# Patient Record
Sex: Female | Born: 1975 | Race: Black or African American | Hispanic: No | Marital: Single | Smoking: Never smoker
Health system: Southern US, Community
[De-identification: ages and names within clinical notes are randomized; demographics above are authoritative.]

## PROBLEM LIST (undated history)

## (undated) DIAGNOSIS — D649 Anemia, unspecified: Secondary | ICD-10-CM

## (undated) DIAGNOSIS — R197 Diarrhea, unspecified: Secondary | ICD-10-CM

## (undated) DIAGNOSIS — R1110 Vomiting, unspecified: Secondary | ICD-10-CM

## (undated) DIAGNOSIS — R011 Cardiac murmur, unspecified: Secondary | ICD-10-CM

## (undated) DIAGNOSIS — M722 Plantar fascial fibromatosis: Secondary | ICD-10-CM

## (undated) DIAGNOSIS — I10 Essential (primary) hypertension: Secondary | ICD-10-CM

## (undated) DIAGNOSIS — F32A Depression, unspecified: Secondary | ICD-10-CM

## (undated) DIAGNOSIS — E669 Obesity, unspecified: Secondary | ICD-10-CM

## (undated) DIAGNOSIS — R0602 Shortness of breath: Secondary | ICD-10-CM

## (undated) HISTORY — DX: Plantar fascial fibromatosis: M72.2

## (undated) HISTORY — DX: Anemia, unspecified: D64.9

## (undated) HISTORY — DX: Cardiac murmur, unspecified: R01.1

## (undated) HISTORY — DX: Vomiting, unspecified: R11.10

## (undated) HISTORY — PX: TUBAL LIGATION: SHX77

## (undated) HISTORY — DX: Depression, unspecified: F32.A

## (undated) HISTORY — DX: Diarrhea, unspecified: R19.7

## (undated) HISTORY — DX: Obesity, unspecified: E66.9

---

## 1998-02-19 ENCOUNTER — Inpatient Hospital Stay (HOSPITAL_COMMUNITY): Admission: AD | Admit: 1998-02-19 | Discharge: 1998-02-19 | Payer: Self-pay | Admitting: Obstetrics

## 1998-03-28 ENCOUNTER — Inpatient Hospital Stay (HOSPITAL_COMMUNITY): Admission: AD | Admit: 1998-03-28 | Discharge: 1998-03-30 | Payer: Self-pay | Admitting: Obstetrics & Gynecology

## 2000-04-01 ENCOUNTER — Encounter: Payer: Self-pay | Admitting: Emergency Medicine

## 2000-04-01 ENCOUNTER — Emergency Department (HOSPITAL_COMMUNITY): Admission: EM | Admit: 2000-04-01 | Discharge: 2000-04-01 | Payer: Self-pay | Admitting: Emergency Medicine

## 2000-10-24 ENCOUNTER — Emergency Department (HOSPITAL_COMMUNITY): Admission: EM | Admit: 2000-10-24 | Discharge: 2000-10-24 | Payer: Self-pay | Admitting: Emergency Medicine

## 2000-10-24 ENCOUNTER — Encounter: Payer: Self-pay | Admitting: Emergency Medicine

## 2001-02-04 ENCOUNTER — Emergency Department (HOSPITAL_COMMUNITY): Admission: EM | Admit: 2001-02-04 | Discharge: 2001-02-04 | Payer: Self-pay | Admitting: Emergency Medicine

## 2001-08-21 ENCOUNTER — Emergency Department (HOSPITAL_COMMUNITY): Admission: EM | Admit: 2001-08-21 | Discharge: 2001-08-21 | Payer: Self-pay | Admitting: Emergency Medicine

## 2001-10-23 ENCOUNTER — Inpatient Hospital Stay (HOSPITAL_COMMUNITY): Admission: AD | Admit: 2001-10-23 | Discharge: 2001-10-23 | Payer: Self-pay | Admitting: Obstetrics & Gynecology

## 2002-01-13 ENCOUNTER — Emergency Department (HOSPITAL_COMMUNITY): Admission: EM | Admit: 2002-01-13 | Discharge: 2002-01-13 | Payer: Self-pay | Admitting: Emergency Medicine

## 2002-01-14 ENCOUNTER — Inpatient Hospital Stay (HOSPITAL_COMMUNITY): Admission: AD | Admit: 2002-01-14 | Discharge: 2002-01-14 | Payer: Self-pay | Admitting: Obstetrics

## 2002-01-26 ENCOUNTER — Ambulatory Visit (HOSPITAL_COMMUNITY): Admission: RE | Admit: 2002-01-26 | Discharge: 2002-01-26 | Payer: Self-pay | Admitting: Obstetrics

## 2002-01-26 ENCOUNTER — Encounter: Payer: Self-pay | Admitting: Obstetrics

## 2002-02-26 ENCOUNTER — Inpatient Hospital Stay (HOSPITAL_COMMUNITY): Admission: AD | Admit: 2002-02-26 | Discharge: 2002-02-26 | Payer: Self-pay | Admitting: Obstetrics

## 2002-04-09 ENCOUNTER — Encounter (INDEPENDENT_AMBULATORY_CARE_PROVIDER_SITE_OTHER): Payer: Self-pay | Admitting: *Deleted

## 2002-04-09 ENCOUNTER — Inpatient Hospital Stay (HOSPITAL_COMMUNITY): Admission: AD | Admit: 2002-04-09 | Discharge: 2002-04-14 | Payer: Self-pay | Admitting: Obstetrics

## 2003-01-31 ENCOUNTER — Emergency Department (HOSPITAL_COMMUNITY): Admission: EM | Admit: 2003-01-31 | Discharge: 2003-01-31 | Payer: Self-pay | Admitting: Emergency Medicine

## 2003-07-07 ENCOUNTER — Inpatient Hospital Stay (HOSPITAL_COMMUNITY): Admission: AD | Admit: 2003-07-07 | Discharge: 2003-07-07 | Payer: Self-pay | Admitting: Obstetrics

## 2003-11-18 ENCOUNTER — Inpatient Hospital Stay (HOSPITAL_COMMUNITY): Admission: AD | Admit: 2003-11-18 | Discharge: 2003-11-18 | Payer: Self-pay | Admitting: Obstetrics & Gynecology

## 2004-07-10 ENCOUNTER — Emergency Department (HOSPITAL_COMMUNITY): Admission: EM | Admit: 2004-07-10 | Discharge: 2004-07-11 | Payer: Self-pay | Admitting: Emergency Medicine

## 2004-10-27 ENCOUNTER — Emergency Department (HOSPITAL_COMMUNITY): Admission: EM | Admit: 2004-10-27 | Discharge: 2004-10-27 | Payer: Self-pay | Admitting: Emergency Medicine

## 2005-09-03 ENCOUNTER — Emergency Department (HOSPITAL_COMMUNITY): Admission: EM | Admit: 2005-09-03 | Discharge: 2005-09-03 | Payer: Self-pay | Admitting: *Deleted

## 2005-10-25 ENCOUNTER — Emergency Department (HOSPITAL_COMMUNITY): Admission: EM | Admit: 2005-10-25 | Discharge: 2005-10-25 | Payer: Self-pay | Admitting: Emergency Medicine

## 2007-03-25 ENCOUNTER — Emergency Department (HOSPITAL_COMMUNITY): Admission: EM | Admit: 2007-03-25 | Discharge: 2007-03-26 | Payer: Self-pay | Admitting: Emergency Medicine

## 2007-10-15 ENCOUNTER — Ambulatory Visit: Payer: Self-pay | Admitting: Internal Medicine

## 2007-10-28 ENCOUNTER — Emergency Department (HOSPITAL_COMMUNITY): Admission: EM | Admit: 2007-10-28 | Discharge: 2007-10-28 | Payer: Self-pay | Admitting: Family Medicine

## 2007-10-31 ENCOUNTER — Emergency Department (HOSPITAL_COMMUNITY): Admission: EM | Admit: 2007-10-31 | Discharge: 2007-10-31 | Payer: Self-pay | Admitting: Emergency Medicine

## 2007-11-14 ENCOUNTER — Ambulatory Visit: Payer: Self-pay | Admitting: *Deleted

## 2008-01-23 ENCOUNTER — Encounter (INDEPENDENT_AMBULATORY_CARE_PROVIDER_SITE_OTHER): Payer: Self-pay | Admitting: Internal Medicine

## 2008-01-23 ENCOUNTER — Ambulatory Visit: Payer: Self-pay | Admitting: Family Medicine

## 2008-01-23 ENCOUNTER — Encounter: Payer: Self-pay | Admitting: Family Medicine

## 2008-01-23 LAB — CONVERTED CEMR LAB
Albumin: 4.5 g/dL (ref 3.5–5.2)
Alkaline Phosphatase: 42 units/L (ref 39–117)
Chlamydia, DNA Probe: NEGATIVE
Eosinophils Absolute: 0.1 10*3/uL (ref 0.0–0.7)
GC Probe Amp, Genital: NEGATIVE
Glucose, Bld: 81 mg/dL (ref 70–99)
LDL Cholesterol: 70 mg/dL (ref 0–99)
Lymphocytes Relative: 34 % (ref 12–46)
Lymphs Abs: 2 10*3/uL (ref 0.7–4.0)
MCV: 76.6 fL — ABNORMAL LOW (ref 78.0–100.0)
Neutrophils Relative %: 58 % (ref 43–77)
Platelets: 186 10*3/uL (ref 150–400)
Potassium: 4 meq/L (ref 3.5–5.3)
Sodium: 141 meq/L (ref 135–145)
TSH: 0.706 microintl units/mL (ref 0.350–5.50)
Total Protein: 7.2 g/dL (ref 6.0–8.3)
Triglycerides: 54 mg/dL (ref <150)
WBC: 6 10*3/uL (ref 4.0–10.5)

## 2008-01-29 ENCOUNTER — Encounter: Admission: RE | Admit: 2008-01-29 | Discharge: 2008-01-29 | Payer: Self-pay | Admitting: Family Medicine

## 2008-02-25 ENCOUNTER — Ambulatory Visit: Payer: Self-pay | Admitting: Internal Medicine

## 2008-03-10 ENCOUNTER — Emergency Department (HOSPITAL_COMMUNITY): Admission: EM | Admit: 2008-03-10 | Discharge: 2008-03-10 | Payer: Self-pay | Admitting: Emergency Medicine

## 2008-03-10 ENCOUNTER — Emergency Department (HOSPITAL_COMMUNITY): Admission: EM | Admit: 2008-03-10 | Discharge: 2008-03-10 | Payer: Self-pay | Admitting: Family Medicine

## 2008-04-26 ENCOUNTER — Emergency Department (HOSPITAL_COMMUNITY): Admission: EM | Admit: 2008-04-26 | Discharge: 2008-04-26 | Payer: Self-pay | Admitting: Family Medicine

## 2008-05-25 ENCOUNTER — Emergency Department (HOSPITAL_COMMUNITY): Admission: EM | Admit: 2008-05-25 | Discharge: 2008-05-26 | Payer: Self-pay | Admitting: *Deleted

## 2008-10-19 ENCOUNTER — Ambulatory Visit: Payer: Self-pay | Admitting: Family Medicine

## 2009-01-24 ENCOUNTER — Encounter (INDEPENDENT_AMBULATORY_CARE_PROVIDER_SITE_OTHER): Payer: Self-pay | Admitting: Adult Health

## 2009-01-24 ENCOUNTER — Encounter: Payer: Self-pay | Admitting: Family Medicine

## 2009-01-24 ENCOUNTER — Ambulatory Visit: Payer: Self-pay | Admitting: Family Medicine

## 2009-01-24 LAB — CONVERTED CEMR LAB: Chlamydia, DNA Probe: NEGATIVE

## 2009-02-06 ENCOUNTER — Emergency Department (HOSPITAL_COMMUNITY): Admission: EM | Admit: 2009-02-06 | Discharge: 2009-02-06 | Payer: Self-pay | Admitting: Family Medicine

## 2009-02-07 ENCOUNTER — Ambulatory Visit: Payer: Self-pay | Admitting: Internal Medicine

## 2009-04-10 ENCOUNTER — Emergency Department (HOSPITAL_COMMUNITY): Admission: EM | Admit: 2009-04-10 | Discharge: 2009-04-10 | Payer: Self-pay | Admitting: Family Medicine

## 2009-04-12 ENCOUNTER — Ambulatory Visit: Payer: Self-pay | Admitting: Family Medicine

## 2009-09-12 ENCOUNTER — Telehealth (INDEPENDENT_AMBULATORY_CARE_PROVIDER_SITE_OTHER): Payer: Self-pay | Admitting: *Deleted

## 2009-09-12 ENCOUNTER — Ambulatory Visit: Payer: Self-pay | Admitting: Internal Medicine

## 2009-09-27 ENCOUNTER — Telehealth (INDEPENDENT_AMBULATORY_CARE_PROVIDER_SITE_OTHER): Payer: Self-pay | Admitting: *Deleted

## 2009-09-27 ENCOUNTER — Emergency Department (HOSPITAL_COMMUNITY): Admission: EM | Admit: 2009-09-27 | Discharge: 2009-09-27 | Payer: Self-pay | Admitting: Family Medicine

## 2009-10-04 ENCOUNTER — Ambulatory Visit: Payer: Self-pay | Admitting: Internal Medicine

## 2009-10-04 ENCOUNTER — Telehealth (INDEPENDENT_AMBULATORY_CARE_PROVIDER_SITE_OTHER): Payer: Self-pay | Admitting: *Deleted

## 2009-10-04 ENCOUNTER — Emergency Department (HOSPITAL_COMMUNITY): Admission: EM | Admit: 2009-10-04 | Discharge: 2009-10-04 | Payer: Self-pay | Admitting: Emergency Medicine

## 2009-10-05 ENCOUNTER — Ambulatory Visit (HOSPITAL_COMMUNITY): Admission: RE | Admit: 2009-10-05 | Discharge: 2009-10-05 | Payer: Self-pay | Admitting: Emergency Medicine

## 2009-11-07 ENCOUNTER — Inpatient Hospital Stay (HOSPITAL_COMMUNITY): Admission: AD | Admit: 2009-11-07 | Discharge: 2009-11-07 | Payer: Self-pay | Admitting: Obstetrics & Gynecology

## 2009-11-14 ENCOUNTER — Inpatient Hospital Stay (HOSPITAL_COMMUNITY): Admission: RE | Admit: 2009-11-14 | Discharge: 2009-11-14 | Payer: Self-pay | Admitting: Obstetrics and Gynecology

## 2010-03-23 ENCOUNTER — Emergency Department (HOSPITAL_COMMUNITY): Admission: EM | Admit: 2010-03-23 | Discharge: 2010-03-23 | Payer: Self-pay | Admitting: Emergency Medicine

## 2010-07-16 ENCOUNTER — Inpatient Hospital Stay (HOSPITAL_COMMUNITY): Admission: AD | Admit: 2010-07-16 | Discharge: 2010-07-16 | Payer: Self-pay | Admitting: Obstetrics & Gynecology

## 2010-07-16 ENCOUNTER — Ambulatory Visit: Payer: Self-pay | Admitting: Gynecology

## 2010-08-07 ENCOUNTER — Inpatient Hospital Stay (HOSPITAL_COMMUNITY): Admission: AD | Admit: 2010-08-07 | Discharge: 2010-08-07 | Payer: Self-pay | Admitting: Obstetrics & Gynecology

## 2010-08-07 ENCOUNTER — Ambulatory Visit: Payer: Self-pay | Admitting: Family Medicine

## 2010-08-23 ENCOUNTER — Ambulatory Visit (HOSPITAL_COMMUNITY): Admission: RE | Admit: 2010-08-23 | Discharge: 2010-08-23 | Payer: Self-pay | Admitting: Family Medicine

## 2010-09-13 ENCOUNTER — Ambulatory Visit (HOSPITAL_COMMUNITY)
Admission: RE | Admit: 2010-09-13 | Discharge: 2010-09-13 | Payer: Self-pay | Source: Home / Self Care | Admitting: Family Medicine

## 2010-09-28 ENCOUNTER — Ambulatory Visit (HOSPITAL_COMMUNITY)
Admission: RE | Admit: 2010-09-28 | Discharge: 2010-09-28 | Payer: Self-pay | Source: Home / Self Care | Admitting: Family Medicine

## 2010-09-28 ENCOUNTER — Encounter: Payer: Self-pay | Admitting: Family Medicine

## 2010-11-15 ENCOUNTER — Inpatient Hospital Stay (HOSPITAL_COMMUNITY)
Admission: AD | Admit: 2010-11-15 | Discharge: 2010-11-15 | Payer: Self-pay | Source: Home / Self Care | Attending: Obstetrics & Gynecology | Admitting: Obstetrics & Gynecology

## 2010-11-20 LAB — URINALYSIS, ROUTINE W REFLEX MICROSCOPIC
Protein, ur: NEGATIVE mg/dL
Urobilinogen, UA: 0.2 mg/dL (ref 0.0–1.0)

## 2011-01-10 LAB — CBC
Hemoglobin: 10.1 g/dL — ABNORMAL LOW (ref 12.0–15.0)
Platelets: 144 10*3/uL — ABNORMAL LOW (ref 150–400)
RBC: 3.97 MIL/uL (ref 3.87–5.11)
WBC: 7.5 10*3/uL (ref 4.0–10.5)

## 2011-01-10 LAB — GC/CHLAMYDIA PROBE AMP, GENITAL
Chlamydia, DNA Probe: NEGATIVE
GC Probe Amp, Genital: NEGATIVE

## 2011-01-10 LAB — URINALYSIS, ROUTINE W REFLEX MICROSCOPIC
Bilirubin Urine: NEGATIVE
Hgb urine dipstick: NEGATIVE
Ketones, ur: 15 mg/dL — AB
Protein, ur: NEGATIVE mg/dL
Urobilinogen, UA: 0.2 mg/dL (ref 0.0–1.0)

## 2011-01-11 LAB — URINALYSIS, ROUTINE W REFLEX MICROSCOPIC
Glucose, UA: NEGATIVE mg/dL
Nitrite: NEGATIVE
pH: 6.5 (ref 5.0–8.0)

## 2011-01-11 LAB — CBC
HCT: 36 % (ref 36.0–46.0)
Hemoglobin: 11.6 g/dL — ABNORMAL LOW (ref 12.0–15.0)
RDW: 13.2 % (ref 11.5–15.5)
WBC: 7.3 10*3/uL (ref 4.0–10.5)

## 2011-01-11 LAB — WET PREP, GENITAL
Trich, Wet Prep: NONE SEEN
Yeast Wet Prep HPF POC: NONE SEEN

## 2011-01-14 LAB — GC/CHLAMYDIA PROBE AMP, GENITAL: GC Probe Amp, Genital: NEGATIVE

## 2011-01-14 LAB — URINALYSIS, ROUTINE W REFLEX MICROSCOPIC
Ketones, ur: NEGATIVE mg/dL
Nitrite: NEGATIVE
Protein, ur: NEGATIVE mg/dL

## 2011-01-14 LAB — CBC
Hemoglobin: 10.8 g/dL — ABNORMAL LOW (ref 12.0–15.0)
MCHC: 31.7 g/dL (ref 30.0–36.0)
MCV: 78.3 fL (ref 78.0–100.0)
RDW: 13.2 % (ref 11.5–15.5)

## 2011-01-14 LAB — WET PREP, GENITAL

## 2011-01-14 LAB — ABO/RH: ABO/RH(D): B POS

## 2011-01-14 LAB — HCG, QUANTITATIVE, PREGNANCY: hCG, Beta Chain, Quant, S: 1786 m[IU]/mL — ABNORMAL HIGH (ref <5)

## 2011-02-14 ENCOUNTER — Observation Stay (HOSPITAL_COMMUNITY)
Admission: AD | Admit: 2011-02-14 | Discharge: 2011-02-14 | DRG: 780 | Disposition: A | Discharge status: Home / Self Care | Payer: Medicaid Other | Source: Ambulatory Visit | Attending: Obstetrics & Gynecology | Admitting: Obstetrics & Gynecology

## 2011-02-14 ENCOUNTER — Encounter (HOSPITAL_COMMUNITY): Payer: Self-pay | Admitting: Radiology

## 2011-02-14 DIAGNOSIS — O479 False labor, unspecified: Principal | ICD-10-CM | POA: Diagnosis present

## 2011-02-14 LAB — CBC
MCV: 76.1 fL — ABNORMAL LOW (ref 78.0–100.0)
Platelets: 115 10*3/uL — ABNORMAL LOW (ref 150–400)
RBC: 3.76 MIL/uL — ABNORMAL LOW (ref 3.87–5.11)
WBC: 8 10*3/uL (ref 4.0–10.5)

## 2011-02-15 ENCOUNTER — Inpatient Hospital Stay (HOSPITAL_COMMUNITY)
Admission: AD | Admit: 2011-02-15 | Discharge: 2011-02-15 | Disposition: A | Discharge status: Home / Self Care | Payer: Medicaid Other | Source: Ambulatory Visit | Attending: Obstetrics & Gynecology | Admitting: Obstetrics & Gynecology

## 2011-02-15 DIAGNOSIS — O479 False labor, unspecified: Secondary | ICD-10-CM

## 2011-02-25 ENCOUNTER — Inpatient Hospital Stay (HOSPITAL_COMMUNITY)
Admission: AD | Admit: 2011-02-25 | Discharge: 2011-02-25 | Disposition: A | Discharge status: Home / Self Care | Payer: Medicaid Other | Source: Ambulatory Visit | Attending: Obstetrics and Gynecology | Admitting: Obstetrics and Gynecology

## 2011-02-25 DIAGNOSIS — O479 False labor, unspecified: Secondary | ICD-10-CM | POA: Insufficient documentation

## 2011-02-26 ENCOUNTER — Inpatient Hospital Stay (HOSPITAL_COMMUNITY)
Admission: AD | Admit: 2011-02-26 | Discharge: 2011-02-28 | DRG: 767 | Disposition: A | Discharge status: Home / Self Care | Payer: Medicaid Other | Source: Ambulatory Visit | Attending: Obstetrics and Gynecology | Admitting: Obstetrics and Gynecology

## 2011-02-26 DIAGNOSIS — Z302 Encounter for sterilization: Secondary | ICD-10-CM

## 2011-02-26 LAB — CBC
Hemoglobin: 9.1 g/dL — ABNORMAL LOW (ref 12.0–15.0)
MCH: 23.7 pg — ABNORMAL LOW (ref 26.0–34.0)
RBC: 3.84 MIL/uL — ABNORMAL LOW (ref 3.87–5.11)
WBC: 11.2 10*3/uL — ABNORMAL HIGH (ref 4.0–10.5)

## 2011-02-26 LAB — RPR: RPR Ser Ql: NONREACTIVE

## 2011-02-27 DIAGNOSIS — Z302 Encounter for sterilization: Secondary | ICD-10-CM

## 2011-02-27 LAB — SURGICAL PCR SCREEN
MRSA, PCR: NEGATIVE
Staphylococcus aureus: NEGATIVE

## 2011-02-27 LAB — MRSA PCR SCREENING: MRSA by PCR: NEGATIVE

## 2011-02-28 LAB — CBC
HCT: 24.9 % — ABNORMAL LOW (ref 36.0–46.0)
MCHC: 30.5 g/dL (ref 30.0–36.0)
Platelets: 140 10*3/uL — ABNORMAL LOW (ref 150–400)
RDW: 14 % (ref 11.5–15.5)
WBC: 6.7 10*3/uL (ref 4.0–10.5)

## 2011-02-28 NOTE — Op Note (Addendum)
[name], [name]               ACCOUNT NO.:  [id number]  MEDICAL RECORD NO.:  [id number]           PATIENT TYPE:  I  LOCATION:  9143                          [facility]ACILITY:  [facility]  PHYSICIAN:  Scheryl Darter, MD       DATE OF BIRTH:  12/27/1975  DATE OF PROCEDURE:  02/27/2011 DATE OF DISCHARGE:                              OPERATIVE REPORT   PREOPERATIVE DIAGNOSES: 1. Multiparity. 2. Desires permanent sterilization.  POSTOPERATIVE DIAGNOSES: 1. Multiparity. 2. Desires permanent sterilization.  PROCEDURE:  Postpartum bilateral tubal ligation with Filshie clips.  SURGEON:  Dr. Debroah Loop and Dr. Orvan Falconer.  ANESTHESIA:  Epidural and local with 8 mL of 0.25% Marcaine.  IV FLUIDS:  600.  URINE OUTPUT:  200 mL of clear urine at the end of procedure.  ESTIMATED BLOOD LOSS:  Minimal.  FINDINGS:  Normal uterus and adnexa bilaterally with paratubal cysts present on the right that were small.  Complications were none immediate. INDICATIONS:  This is a 35 year old gravida 5, para 3-1-1-4, status post a vacuum-assisted vaginal delivery on postpartum day #1 who desired a bilateral tubal ligation for permanent sterilization.  Risks, benefits and alternatives were discussed with the patient including but not limited to risk of failure, risk of ectopic, risk of regret being about 0.25% and alternative forms of contraception that are less permanent, the fact that this is permanent and risk of the surgery including bleeding, infection, pain, injury to intraabdominal organs and given all of the aforementioned risks and benefits, the patient consented to bilateral tubal ligation.  PROCEDURE NOTED IN DETAIL:  The patient was taken back to the operative suite where an epidural catheter was rebolused.  After anesthesia was found to be adequate, the patient was prepped and draped in normal sterile fashion and infraumbilical skin incision was made with a scalpel, carried down through to the  underlying fascia.  The fascia was grasped with Kocher clamps and entered with the Mayo scissors.  The fascial incision was then extended manually.  The retractors were then inserted.  The right fallopian tube was identified, grasped with a Babcock clamp, brought to the incision, followed out to the fimbriae and the Filshie clip was then deployed in the isthmic region, that adnexa was then returned to the abdomen in a similar fashion.  The left fallopian tube was then grasped with a Babcock clamp, followed out to the fimbriae and the Filshie clip was then deployed in the isthmic region and that adnexa was then returned to the abdomen.  The fascia was then closed using an 0 Vicryl in a running fashion and the skin was then closed with 4-0 Vicryl in a subcuticular fashion; 8 mL of 0.25% Marcaine was then used for additional local anesthesia.  Lap, needle, instrument and sponge counts were correct x2.  The patient tolerated the procedure well and the patient was taken back to the recovery area in stable condition and there were no immediate complications.    ______________________________ Maryelizabeth Kaufmann, MD   ______________________________ Scheryl Darter, MD    LC/MEDQ  D:  02/27/2011  T:  02/28/2011  Job:  [id number]  Electronically Signed  by Maryelizabeth Kaufmann MD on 03/02/2011 09:12:00 AM Electronically Signed by Scheryl Darter MD on 03/09/2011 10:29:29 AM

## 2011-03-02 NOTE — Discharge Summary (Signed)
NAMEANUPAMA, [name]               ACCOUNT NO.:  [id number]  MEDICAL RECORD NO.:  [id number]           PATIENT TYPE:  I  LOCATION:  9143                          [facility]AC[facility]  [facility]  PHYSICIAN:  Maryelizabeth Kaufmann, MD  DATE OF BIRTH:  Mar 12, 1976  DATE OF ADMISSION:  02/26/2011 DATE OF DISCHARGE:  02/28/2011                              DISCHARGE SUMMARY   ADMISSION DIAGNOSIS:  Intrauterine pregnancy at 39 weeks in labor.  DISCHARGE DIAGNOSES: 1. Status post vacuum-assisted vaginal delivery for non-reassuring     fetal heart tracing. 2. Postpartum bilateral tubal ligation with sterilization.  ATTENDING:  Scheryl Darter, MD  FELLOW:  Maryelizabeth Kaufmann, MD  DISCHARGE MEDICATIONS: 1. Motrin 600 mg 1 tab p.o. q.6 h. as needed. 2. Percocet 5/325, 1 tab p.o. q.4 h. as needed. 3. Colace 100 mg 1 tab p.o. twice a day. 4. Iron 325, 1 tab p.o. twice a day.  DATE OF PROCEDURE:  she had a vacuum-assisted vaginal delivery on February 26, 2011, due to non-reassuring fetal heart tracing and deep variables with pushing with delivery of a viable female infant.  Apgar's of 7 and 9.  Weight was 7 pounds and 0.5 ounces.  Estimated blood loss 400 mL.  No laceration were noted at that time. Subsequent procedure was a bilateral tubal ligation with Filshie clips on Feb 27, 2011.  There were no immediate complications.  There were no pathology.  The patient tolerated the procedure well and her postoperative CBC is pending at the time of this dictation.  HOSPITAL COURSE:  This is a 35 year old G5, P3-1-1-3 who presented on February 26, 2011, with intrauterine pregnancy at 39 weeks and 0 days.  GBS negative in labor.  Her labor continued to progress adequately.  She did need some augmentation of Pitocin due to some protracted active phase. Subsequently during the second stage of delivery, she had deep variables with pushing down into the 60s.  Decision was made at that time to do a vacuum-assisted vaginal delivery.   Infant was delivered otherwise atraumatically.  Apgar's were 7 and 9.  Weight was 7 pounds 5 ounces. There were no lacerations, no complications, and estimated blood loss was 450.  The patient's postpartum course was otherwise benign.  She did have postpartum bilateral tubal ligation on Feb 27, 2011, and her postoperative course from that was otherwise benign and stable.  Her preoperative hemoglobin was 9.8 and her postoperative hemoglobin is pending at the time of dictation.  The patient is otherwise afebrile, hemodynamically stable, doing well, and her incision was clean, dry, and intact on the day of discharge.  She was discharged on postpartum day #2.  DISPOSITION:  Discharged to home.  DISCHARGE CONDITION:  Stable.  FOLLOWUP:  The patient is to follow up in the [facility] or [facility] Clinic in 4-6 weeks for postpartum check.  ER WARNINGS:  The patient to return to the emergency department with any fever, chills, nausea, vomiting, any incisional problems such as redness, swelling, discharge, opening or dehiscence or any other complications.          ______________________________ Maryelizabeth Kaufmann, MD  LC/MEDQ  D:  02/28/2011  T:  02/28/2011  Job:  [id number]  Electronically Signed by Maryelizabeth Kaufmann MD on 03/02/2011 09:13:02 AM

## 2011-03-16 NOTE — Discharge Summary (Signed)
   NAME:  ARANTXA, [name]                           ACCOUNT NO.:   MEDICAL RECORD NO.:  [id number]                    PATIENT TYPE:   LOCATION:                                       FACILITY:   PHYSICIAN:  Kathreen Cosier, M.D.           DATE OF BIRTH:   DATE OF ADMISSION:  04/09/2002  DATE OF DISCHARGE:  04/14/2002                                 DISCHARGE SUMMARY   HISTORY OF PRESENT ILLNESS:  The patient is a 35 year old gravida 3, para 2-  0-0-2, [facility] June 08, 2002.  Had a blood pressure of 160/100 in the office  on June 12, 3+ proteinuria, 3+ edema.  When she was admitted her diastolics  were 105, 117, 143.  Uric acid 6.6.  Cervix 1 cm, 50% with a vertex of -2.  She was started on magnesium sulfate 4 g loading, 2 g/hour.  She received  betamethasone 12.5 IM to be repeated in 12 hours.  Cycotec 20 mcg as well  was inserted.  The patient received Apresoline throughout her stay and she  progressed satisfactorily, had a normal vaginal delivery of a female, Apgars  7 and 7.  Team in attendance.  There was a nuchal cord x1.  Post delivery  she was continued on magnesium sulfate and on postpartum day one received  Apresoline 5 mg IV x1 dose with diastolics of 110.  Postpartum day two she  was started on hydrochlorothiazide 50 mg q.d. and by day three has blood  pressure 154/100 on hydrochlorothiazide.  She was also started on Aldomet  500 mg p.o. q.8h.  By the fourth postpartum day blood pressure was 130/80.  She was discharged home on Aldomet 500 q.8h. and hydrochlorothiazide 50 mg  q.d.  To see me in one week for blood pressure check.   DISCHARGE DIAGNOSES:  1. Status post preeclampsia.  2. Induction of labor.  3. Normal vaginal delivery.                                               Kathreen Cosier, M.D.    BAM/MEDQ  D:  07/23/2002  T:  07/23/2002  Job:  [id number]

## 2011-04-25 ENCOUNTER — Ambulatory Visit (HOSPITAL_COMMUNITY)
Admission: RE | Admit: 2011-04-25 | Discharge: 2011-04-25 | Disposition: A | Discharge status: Home / Self Care | Payer: Medicaid Other | Source: Ambulatory Visit | Attending: Obstetrics and Gynecology | Admitting: Obstetrics and Gynecology

## 2011-04-29 NOTE — Discharge Summary (Signed)
  [name], [name]               ACCOUNT NO.:  [id number]  MEDICAL RECORD NO.:  [id number]  LOCATION:  9166                          [facility]ILITY:  [facility]  PHYSICIAN:  Horton Chin, MD DATE OF BIRTH:  1976-02-04  DATE OF ADMISSION:  02/14/2011 DATE OF DISCHARGE:  02/14/2011                              DISCHARGE SUMMARY   REASON FOR HOSPITALIZATION:  Concern for active labor.  DISCHARGE DIAGNOSIS:  Active labor ruled out.  MEDICATIONS:  Continue home medications: 1. Albuterol inhaler p.r.n. 2. Advair 100/50, 1 puff b.i.d. 3. Iron 325 mg p.o. daily. 4. Prenatal vitamin p.o. daily.  BRIEF HOSPITAL COURSE:  This is a 35 year old G5, P2-1-1-3 at 38.1 weeks presenting with contractions concerning for active labor.  The patient was admitted to the Labor and Delivery for observation.  The patient presented to Labor and Delivery at 13:30.  Her cervical exam showed 3-4 cm dilation, 50% effacement, and -2 station.  However, at 19:30, the patient showed no significant cervical change or increase in contractions over the 6 hour time span despite ambulating and using the ball.  Discussed with the patient and family.  Recommend discharged home since not in active labor.  The patient amendable.  [facility]  Given Ambien as needed for difficulty sleeping secondary to abdominal discomfort. Advised to take Tylenol 650 mg q.6 h. p.r.n. for pain.  Given labor precautions.    ______________________________ Priscella Mann, MD   ______________________________ Horton Chin, MD    AO/MEDQ  D:  04/11/2011  T:  04/12/2011  Job:  [id number]  Electronically Signed by Priscella Mann MD on 04/12/2011 06:05:28 PM Electronically Signed by Jaynie Collins MD on 04/29/2011 09:55:58 AM

## 2011-06-18 ENCOUNTER — Inpatient Hospital Stay (HOSPITAL_COMMUNITY)
Admission: EM | Admit: 2011-06-18 | Discharge: 2011-06-21 | DRG: 417 | Disposition: A | Discharge status: Home / Self Care | Payer: Medicaid Other | Attending: Internal Medicine | Admitting: Internal Medicine

## 2011-06-18 ENCOUNTER — Emergency Department (HOSPITAL_COMMUNITY): Payer: Medicaid Other

## 2011-06-18 DIAGNOSIS — K859 Acute pancreatitis without necrosis or infection, unspecified: Secondary | ICD-10-CM

## 2011-06-18 DIAGNOSIS — E669 Obesity, unspecified: Secondary | ICD-10-CM | POA: Diagnosis present

## 2011-06-18 DIAGNOSIS — K8020 Calculus of gallbladder without cholecystitis without obstruction: Secondary | ICD-10-CM

## 2011-06-18 DIAGNOSIS — J45909 Unspecified asthma, uncomplicated: Secondary | ICD-10-CM | POA: Diagnosis present

## 2011-06-18 DIAGNOSIS — R1011 Right upper quadrant pain: Secondary | ICD-10-CM | POA: Diagnosis present

## 2011-06-18 DIAGNOSIS — K8050 Calculus of bile duct without cholangitis or cholecystitis without obstruction: Principal | ICD-10-CM | POA: Diagnosis present

## 2011-06-18 DIAGNOSIS — D509 Iron deficiency anemia, unspecified: Secondary | ICD-10-CM | POA: Diagnosis present

## 2011-06-18 LAB — CBC
Hemoglobin: 11.8 g/dL — ABNORMAL LOW (ref 12.0–15.0)
Platelets: 178 10*3/uL (ref 150–400)
RBC: 5.03 MIL/uL (ref 3.87–5.11)
WBC: 5.5 10*3/uL (ref 4.0–10.5)

## 2011-06-18 LAB — DIFFERENTIAL
Basophils Absolute: 0 10*3/uL (ref 0.0–0.1)
Basophils Relative: 0 % (ref 0–1)
Eosinophils Absolute: 0.1 10*3/uL (ref 0.0–0.7)
Neutro Abs: 3 10*3/uL (ref 1.7–7.7)
Neutrophils Relative %: 53 % (ref 43–77)

## 2011-06-18 LAB — URINALYSIS, ROUTINE W REFLEX MICROSCOPIC
Bilirubin Urine: NEGATIVE
Glucose, UA: NEGATIVE mg/dL
Nitrite: NEGATIVE
Specific Gravity, Urine: 1.026 (ref 1.005–1.030)
pH: 5.5 (ref 5.0–8.0)

## 2011-06-18 LAB — COMPREHENSIVE METABOLIC PANEL
AST: 19 U/L (ref 0–37)
Albumin: 3.8 g/dL (ref 3.5–5.2)
Alkaline Phosphatase: 73 U/L (ref 39–117)
BUN: 13 mg/dL (ref 6–23)
CO2: 27 mEq/L (ref 19–32)
Chloride: 103 mEq/L (ref 96–112)
Potassium: 3.8 mEq/L (ref 3.5–5.1)
Total Bilirubin: 0.3 mg/dL (ref 0.3–1.2)

## 2011-06-18 LAB — LIPASE, BLOOD: Lipase: 1825 U/L — ABNORMAL HIGH (ref 11–59)

## 2011-06-18 LAB — POCT PREGNANCY, URINE: Preg Test, Ur: NEGATIVE

## 2011-06-18 MED ORDER — IOHEXOL 300 MG/ML  SOLN
100.0000 mL | Freq: Once | INTRAMUSCULAR | Status: AC | PRN
  Administered 2011-06-18: 100 mL via INTRAVENOUS

## 2011-06-19 ENCOUNTER — Inpatient Hospital Stay (HOSPITAL_COMMUNITY): Payer: Medicaid Other

## 2011-06-19 ENCOUNTER — Encounter (HOSPITAL_COMMUNITY): Payer: Self-pay | Admitting: Radiology

## 2011-06-19 DIAGNOSIS — K8010 Calculus of gallbladder with chronic cholecystitis without obstruction: Secondary | ICD-10-CM

## 2011-06-19 HISTORY — PX: CHOLECYSTECTOMY: SHX55

## 2011-06-19 LAB — COMPREHENSIVE METABOLIC PANEL
Albumin: 3.1 g/dL — ABNORMAL LOW (ref 3.5–5.2)
BUN: 12 mg/dL (ref 6–23)
Calcium: 8.6 mg/dL (ref 8.4–10.5)
Creatinine, Ser: 0.74 mg/dL (ref 0.50–1.10)
Total Protein: 6.7 g/dL (ref 6.0–8.3)

## 2011-06-19 LAB — LIPASE, BLOOD: Lipase: 55 U/L (ref 11–59)

## 2011-06-19 LAB — SEDIMENTATION RATE: Sed Rate: 27 mm/hr — ABNORMAL HIGH (ref 0–22)

## 2011-06-19 LAB — MAGNESIUM: Magnesium: 2 mg/dL (ref 1.5–2.5)

## 2011-06-19 LAB — CBC
HCT: 32.9 % — ABNORMAL LOW (ref 36.0–46.0)
MCHC: 31.3 g/dL (ref 30.0–36.0)
MCV: 72.9 fL — ABNORMAL LOW (ref 78.0–100.0)
RDW: 14.1 % (ref 11.5–15.5)

## 2011-06-20 ENCOUNTER — Other Ambulatory Visit (INDEPENDENT_AMBULATORY_CARE_PROVIDER_SITE_OTHER): Payer: Self-pay | Admitting: Surgery

## 2011-06-20 ENCOUNTER — Inpatient Hospital Stay (HOSPITAL_COMMUNITY): Payer: Medicaid Other

## 2011-06-20 LAB — COMPREHENSIVE METABOLIC PANEL
AST: 206 U/L — ABNORMAL HIGH (ref 0–37)
Albumin: 3.5 g/dL (ref 3.5–5.2)
Chloride: 103 mEq/L (ref 96–112)
Creatinine, Ser: 0.59 mg/dL (ref 0.50–1.10)
Potassium: 4.5 mEq/L (ref 3.5–5.1)
Total Bilirubin: 0.4 mg/dL (ref 0.3–1.2)

## 2011-06-20 LAB — CBC
MCV: 72.8 fL — ABNORMAL LOW (ref 78.0–100.0)
Platelets: 150 10*3/uL (ref 150–400)
RDW: 13.9 % (ref 11.5–15.5)
WBC: 8.8 10*3/uL (ref 4.0–10.5)

## 2011-06-20 LAB — TYPE AND SCREEN: ABO/RH(D): B POS

## 2011-06-20 LAB — ABO/RH: ABO/RH(D): B POS

## 2011-06-20 NOTE — Op Note (Signed)
NAMEGARA, KINCADE NO.:  [id number]  MEDICAL RECORD NO.:  [id number]  LOCATION:  5523                         FACILITY:  [facility]  PHYSICIAN:  Wilmon Arms. Corliss Skains, M.D. DATE OF BIRTH:  Jul 15, 1976  DATE OF PROCEDURE:  06/19/2011 DATE OF DISCHARGE:                              OPERATIVE REPORT   PREOPERATIVE DIAGNOSIS:  Gallstone pancreatitis.  POSTOPERATIVE DIAGNOSES:  Gallstone pancreatitis, choledocholithiasis.  PROCEDURE PERFORMED:  Laparoscopic cholecystectomy with intraoperative cholangiogram.  SURGEON:  Wilmon Arms. Corliss Skains, MD  ASSISTANCE:  Brayton El, PA-C  ANESTHESIA:  General endotracheal.  INDICATIONS:  This is a 35 year old female who was admitted on June 18, 2011, with gallstone pancreatitis.  Her pancreatic enzymes normal as quickly and her liver function tests have remained normal.  She presents now for cholecystectomy.  OPERATIVE FINDINGS:  The patient had minimal scarring around her gallbladder.  However, the cholangiogram showed multiple filling defects within the distal common bile duct at the ampulla.  We attempted to use glucagon to open the sphincter body, but this was unsuccessful.  Repeat cholangiogram showed no flow into the duodenum.  DESCRIPTION OF PROCEDURE:  The patient was brought to the operating room and placed in supine position on the operating room table.  After an adequate level of general anesthesia was obtained, the patient's abdomen was prepped with ChloraPrep and draped in sterile fashion.  Time-out was taken to the proper patient, proper procedure.  We infiltrated the area below the umbilicus with 0.25% Marcaine with epinephrine.  We excised her previous infraumbilical transverse cuboid.  Dissection was carried down the fascia.  The fascia was opened vertically.  We entered the peritoneal cavity with bluntly.  A stay sutures of 0 Vicryl was placed around the fascial opening.  The Hasson cannula was inserted and  secured to stay suture.  Pneumoperitoneum was obtained by insufflating CO2 maintaining maximal pressure of 15 mmHg.  The laparoscope was inserted and the patient was positioned to reverse Trendelenburg and tilted to her left.  An 11-mm port was placed in the subxiphoid position.  Two 5 mL ports were placed in the right upper quadrant.  The gallbladder was grasped with a clamp and elevated over the edge of the liver.  There were minimal adhesions to the gallbladder.  We opened the peritoneum around the hilum of the gallbladder and we bluntly dissected around the cystic duct and cystic artery.  The cystic duct was ligated with a clip distally.  A small opening was created on the cystic duct.  A cholangiogram was then obtained showing good flow proximally in the biliary tree.  Contrast flowed distally in the common bile duct, but there were several filling defects at the ampulla and contrast never flowed into the duodenum.  We administered 1 mg of glucagon and waited several minutes.  We attempted another cholangiogram, but there was no contrast flow into the duodenum.  The patient will need an ERCP.  The catheter was removed and a cystic duct was ligated with clips and divided.  Both branches of the cystic artery were ligated with clips and divided.  The gallbladder was then dissected free from the liver.  The gallbladder was placed in  an EndoCatch sac.  We examined the gallbladder fossa thoroughly for hemostasis.  The gallbladder was then removed through the umbilical port site.  The pursestring suture was used to close the umbilical fascia.  We suctioned our irrigation from the right upper quadrant.  The ports were removed as pneumoperitoneum was all released.  A 4-0 Monocryl was used to close the skin incisions.  Steri- Strips were cleaned and dressings were applied.  The patient was then extubated and brought to recovery room in stable condition.  All sponge, instrument and needle  counts were correct.     Wilmon Arms. Corliss Skains, M.D.     MKT/MEDQ  D:  06/19/2011  T:  06/20/2011  Job:  [id number]  Electronically Signed by Manus Rudd M.D. on 06/20/2011 09:47:07 PM

## 2011-06-21 LAB — CBC
HCT: 33.1 % — ABNORMAL LOW (ref 36.0–46.0)
Platelets: 168 10*3/uL (ref 150–400)
RDW: 14.1 % (ref 11.5–15.5)
WBC: 7.3 10*3/uL (ref 4.0–10.5)

## 2011-06-21 LAB — COMPREHENSIVE METABOLIC PANEL
ALT: 127 U/L — ABNORMAL HIGH (ref 0–35)
AST: 55 U/L — ABNORMAL HIGH (ref 0–37)
Albumin: 3.5 g/dL (ref 3.5–5.2)
Alkaline Phosphatase: 99 U/L (ref 39–117)
BUN: 8 mg/dL (ref 6–23)
Chloride: 105 mEq/L (ref 96–112)
Potassium: 4.2 mEq/L (ref 3.5–5.1)
Sodium: 138 mEq/L (ref 135–145)
Total Bilirubin: 0.3 mg/dL (ref 0.3–1.2)

## 2011-06-22 NOTE — Discharge Summary (Signed)
[name], [name] NO.:  [id number]  MEDICAL RECORD NO.:  [id number]  LOCATION:  5523                         FACILITY:  [facility]  PHYSICIAN:  Andreas Blower, MD       DATE OF BIRTH:  June 03, 1976  DATE OF ADMISSION:  06/18/2011 DATE OF DISCHARGE:  06/21/2011                              DISCHARGE SUMMARY   PRIMARY CARE PHYSICIAN:  [facility].  GASTROENTEROLOGIST:  Petra Kuba, MD  SURGEON:  Wilmon Arms. Tsuei, MD  DISCHARGE DIAGNOSES: 1. Gallstone pancreatitis, status post laparoscopic cholecystectomy     and endoscopic retrograde cholangiopancreatography. 2. Asthma. 3. Obesity. 4. Microcytic anemia.  CONSULTANTS: 1. Dr. Vida Rigger, Gastroenterology. 2. Dr. Manus Rudd, [facility].  CONDITION AT THE TIME OF DISCHARGE:  The patient is alert and oriented, requesting discharge to home.  She tells me she is without pain.  She is eating solid food.  She had a bowel movement this morning.  HISTORY AND BRIEF HOSPITAL COURSE:  [name] is a pleasant 35- year-old [place] American female who was status post vaginal delivery in May 2012.  She also has a history of asthma.  She presented to the emergency department with a chief complaint of right upper quadrant abdominal pain radiating to her back that had lasted since she had given birth in May.  She had an ultrasound done on August 20 that showed cholelithiasis with a positive Murphy sign.  She also had a CT abdomen and pelvis on August 20 that showed mild gallbladder distention with no associated inflammatory changes.  Lipase on admission was 1825 and consequently the patient was given the diagnosis of gallstone pancreatitis.  On admission, the patient's LFTs were normal. However, within approximately 36 hours they had risen to an AST of 206, ALT 225.  On August 22, the patient was taken to the OR and underwent laparoscopic cholecystectomy without complications.  This was performed by  Dr. Manus Rudd.  She had an intraoperative cholangiogram that showed contrast filling the biliary tree without evidence of common bile duct stone.  However, contrast did not passed through to the duodenum, so patency could not be confirmed, consequently the patient underwent an ERCP on August 22 with Dr. Vida Rigger.  Dr. Ewing Schlein, swept the common bile duct but did not find any obstructing stones.  Today, on August 23, the patient's LFTs have decreased.  She is reporting that she is pain free. She is able to eat solid foods without difficulty and ready for discharge to home.  PERTINENT LABS THIS HOSPITALIZATION:  Day of discharge CBC, WBC count is 7.3, hemoglobin 10.5, hematocrit 33.1, MCV value 73.4, and platelets 168.  BMET shows a sodium 138, potassium 4.2, chloride 105, bicarb 23, glucose 100, BUN 8, creatinine 0.64, total bilirubin 0.3, alkaline phosphatase 99, AST 55, and ALT 127.  Lipase on August 22 was 148. Pertinent radiological exams were listed in the history and hospital course.  PHYSICAL EXAMINATION:  GENERAL:  Today, the patient is alert and oriented.  She is sitting on the side of the bed.  She has finished eating breakfast and appears well. VITAL SIGNS:  Temperature 98.2, pulse 82, respirations 20, blood pressure  149/67, and O2 sats 98% on room air. HEENT:  Head is atraumatic and normocephalic.  Eyes are anicteric with pupils are equal and round.  Nose shows no nasal discharge or exterior lesions.  Mouth has moist mucous membranes and moderate dentition. NECK:  Supple with midline trachea.  No JVD.  No lymphadenopathy. CHEST:  No accessory muscle use.  No wheezes or crackles to my auscultation. HEART:  Regular rate and rhythm without obvious murmurs, rubs, or gallops. ABDOMEN:  Soft, nondistended.  She has bowel sounds.  She has been multiple small bandages from her laparoscopic surgery, areas around the bandages does not show any erythema, they appear to be  nontender to light palpation. EXTREMITIES:  No clubbing, cyanosis, or edema.  DISCHARGE MEDICATIONS: 1. Hydrocodone/acetaminophen 5/325 1-2 tablets q.6 h. as needed for     pain. 2. Senokot 2 tablets by mouth twice daily to prevent constipation     while she is on her Vicodin. 3. Advair Diskus 250/50 inhaled twice daily. 4. Albuterol inhaler 1 inhalation every 6 hours as needed p.r.n.     shortness of breath.  DISCHARGE INSTRUCTIONS:  The patient will be discharged to home.  She is instructed to increase her activity slowly.  She may shower.  She has no lifting for 3-4 weeks.  Her diet is to be low fat for the next 4-6 weeks and she does not drink alcohol.  FOLLOWUP APPOINTMENTS: 1. She will see Dr. Clelia Croft at [facility] on October 2 at 10:30 a.m. 2. She will present to [facility] at [facility] at     1:30 on September 11 and she is to see Dr. Vida Rigger for     gastroenterology followup as needed. 3. The patient has been instructed to please go to [facility] on     Tuesday, Wednesday and Thursday before     8:00 a.m. for her eligibility appointment.  Suggestions for     outpatient followup, it is noteworthy that the patient's hemoglobin     is 10.5 and her MCV value is 73.4.  Would recommend to our     outpatient physicians that consider an outpatient workup for     microcytic anemia.     Stephani Police, PA   ______________________________ Andreas Blower, MD    MLY/MEDQ  D:  06/21/2011  T:  06/21/2011  Job:  [id number]  cc:   Rosita Fire, M.D. Wilmon Arms. Tsuei, M.D.  Electronically Signed by Algis Downs PA on 06/22/2011 08:51:20 AM Electronically Signed by Wardell Heath Venba Zenner  on 06/22/2011 08:43:26 PM

## 2011-06-29 NOTE — Consult Note (Signed)
NAMEVETRA, SHINALL NO.:  [id number]  MEDICAL RECORD NO.:  [id number]  LOCATION:  5523                         FACILITY:  [facility]  PHYSICIAN:  Wilmon Arms. Corliss Skains, M.D. DATE OF BIRTH:  Jul 03, 1976  DATE OF CONSULTATION:  06/18/2011 DATE OF DISCHARGE:                                CONSULTATION   REQUESTING PHYSICIAN:  Hind Bosie Helper, MD  CONSULTING SURGEON:  Wilmon Arms. Melesio Madara, MD  REASON FOR CONSULTATION:  Abdominal pain with gallstone pancreatitis.  HISTORY OF PRESENT ILLNESS:  Ms. Virgo is a 35 year old black female who is approximately 3 weeks postpartum with a history of asthma who has been having intermittent epigastric abdominal pain since her delivery. The patient awoke this morning around 05:00 a.m. with right upper quadrant abdominal pain.  She denies any epigastric pain or left upper quadrant pain.  She states this radiates around to her back.  She does admit to nausea and vomiting.  Given the severity of the pain, the patient presented to the emergency department.  She had further workup, which included an ultrasound, which revealed cholelithiasis with no ultrasonic findings of cholecystitis.  She was found to have a lipase of 1825.  Her LFTs were unremarkable as well as her BMET and her CBC.  We have been asked by the medicine team to consult on this patient for possible cholecystectomy.  REVIEW OF SYSTEMS:  Please see HPI.  Otherwise, all other systems have been reviewed and are negative.  FAMILY HISTORY:  Noncontributory.  PAST MEDICAL HISTORY: 1. Asthma. 2. Gestational hypertension.  PAST SURGICAL HISTORY:  Laparoscopic tubal ligation.  SOCIAL HISTORY:  The patient lives at home with four children.  She denies any alcohol or illicit drugs.  She does admit to occasional alcohol use, but very minimal.  ALLERGIES:  NKDA.  MEDICATIONS: 1. Advair Diskus 250/50. 2. Albuterol inhaler, which is a rescue inhaler.  PHYSICAL EXAMINATION:   GENERAL:  Ms. Gamel is a 35 year old obese black female who is currently lying in bed in no acute distress. VITAL SIGNS:  Temperature 97.8, pulse 72, blood pressure 121/70, respirations 18. HEENT:  Head is normocephalic, atraumatic.  Sclerae noninjected.  Pupils are equal, round, and reactive to light.  Ears and nose without any obvious masses or lesions.  No rhinorrhea.  Mouth is pink.  Throat shows no exudate. HEART:  Regular rate and rhythm.  Normal S1 and S2.  No murmurs, gallops, rubs are noted.  She does have palpable carotid, radial, and pedal pulses bilaterally. LUNGS:  Clear to auscultation bilaterally with no wheezes, rhonchi, or rales noted.  Respiratory effort is nonlabored. ABDOMEN:  Soft with hypoactive bowel sounds.  She is nondistended.  She is tender in the right upper quadrant with a negative Murphy sign.  She does not have any masses, hernias, or organomegaly noted. MUSCULOSKELETAL:  All four extremities are symmetrical with no cyanosis, clubbing, or edema. SKIN:  Warm and dry with no mass, lesions, or rashes.PSYCH:  The patient is alert and oriented x3 with an appropriate affect.  LABORATORY DATA:  White blood cell count of 5500, hemoglobin 11.8, hematocrit 36.9, platelet count is 178,000.  Sodium, potassium glucose, BUN, and creatinine are  all unremarkable.  AST 19, ALT 15, alkaline phosphatase 73, total bilirubin 0.3, lipase is 1825.  DIAGNOSTICS:  Ultrasound of the abdomen and pelvis revealed gallstones with no evidence of gallbladder wall thickening.  Her common bile duct is 3 mm.  IMPRESSION: 1. Pancreatitis, likely biliary in nature despite completely normal     LFTs. 2. Asthma. 3. History of gestational hypertension.  PLAN:  Given the patient does not drink much alcohol except for on very rare occasion as well as her history since childbirth, it is very likely that the patient's pancreatitis is secondary to gallstones.  Normally, we would expect at  least a small bump in her LFTs, but nonetheless, we will await her lipase to normalize prior to proceeding with a laparoscopic cholecystectomy.  In the meantime, we would recommend that the patient remain n.p.o. except for ice chips to allow her pancreatitis to cool off.  We would recommend aggressive hydration to help with her pancreatitis.  I have explained and discussed all this with the patient as well as her significant other who was in the room.  She understands.     Letha Cape, PA   ______________________________ Wilmon Arms. Corliss Skains, M.D.    KEO/MEDQ  D:  06/18/2011  T:  06/19/2011  Job:  [id number]  Electronically Signed by Barnetta Chapel PA on 06/25/2011 02:37:08 PM Electronically Signed by Manus Rudd M.D. on 06/29/2011 05:02:25 PM

## 2011-07-10 ENCOUNTER — Encounter (INDEPENDENT_AMBULATORY_CARE_PROVIDER_SITE_OTHER): Payer: Self-pay

## 2011-07-17 ENCOUNTER — Encounter (INDEPENDENT_AMBULATORY_CARE_PROVIDER_SITE_OTHER): Payer: Self-pay | Admitting: Surgery

## 2011-07-18 NOTE — Op Note (Signed)
  NAMEKEIRSTIN, MUSIL NO.:  [id number]  MEDICAL RECORD NO.:  [id number]  LOCATION:  5523                         FACILITY:  [facility]  PHYSICIAN:  Petra Kuba, M.D.    DATE OF BIRTH:  1975-11-12  DATE OF PROCEDURE:  06/20/2011 DATE OF DISCHARGE:                              OPERATIVE REPORT   PROCEDURE:  Endoscopic retrograde cholangiopancreatography sphincterotomy and balloon pull-through.  INDICATIONS:  Positive intraoperative cholangiogram worrisome for stones, increased LFTs.  Consent was signed after risks, benefits, methods, options thoroughly discussed multiple times during the hospital stay.  MEDICINES USED:  Fentanyl 125 mcg, Versed 10 mg.  PROCEDURE:  A side-viewing therapeutic video duodenoscope was inserted by indirect vision into the stomach and advanced through a normal antrum and pylorus into the duodenum and a normal-appearing ampulla was brought into view.  Using the triple-lumen sphincterotome loaded with Jag wire, deep selective cannulation was obtained on the first attempt.  Dye was injected, no obvious stones were seen.  We went ahead and proceeded with a medium-sized sphincterotomy until we had adequate biliary drainage and we were able to get the fully bowed sphincterotome in and out of the duct.  No PD injections or wire advancements towards the pancreas were done during the procedure.  We exchanged the sphincterotome for the 12- 15 mm adjustable balloon and proceeded with 3 balloon pull-throughs in the customary fashion without any stone, sludge, or debris being delivered.  There was minimal if no resistance in passing the balloon, which was inflated to 12 mm.  We then proceeded with an occlusion cholangiogram, which was normal.  Once that was pulled through the patent sphincterotomy site, there was slightly sluggish, but adequate biliary drainage.  The wire was removed.  The scope removed.  The patient tolerated the procedure well.   There was no obvious immediate complications.  ENDOSCOPIC DIAGNOSES: 1. Normal ampulla. 2. No PD injections or wire advancements. 3. No obvious stones seen status post medium sphincterotomy and three     12 x 15 adjustable balloon pull-throughs. 4. Negative occlusion cholangiogram with slightly sluggish, but     adequate biliary drainage.  PLAN:  Observe for delayed complications, if none we will slowly advance diet and hopefully home tomorrow.  Happy to see back p.r.n.          ______________________________ Petra Kuba, M.D.     MEM/MEDQ  D:  06/20/2011  T:  06/20/2011  Job:  [id number]  cc:   Wilmon Arms. Tsuei, M.D.  Electronically Signed by Vida Rigger M.D. on 07/18/2011 02:53:05 PM

## 2011-07-18 NOTE — Consult Note (Signed)
  NAMELEANORE, BIGGERS NO.:  [id number]  MEDICAL RECORD NO.:  [id number]  LOCATION:  5523                         FACILITY:  [facility]  PHYSICIAN:  Petra Kuba, M.D.    DATE OF BIRTH:  1976-06-19  DATE OF CONSULTATION:  06/18/2011 DATE OF DISCHARGE:                                CONSULTATION   HISTORY:  The patient with 37-month history of right upper quadrant pain, not every day, but worse with eating came in with increased pain, nausea, and vomiting, although not terribly different than her usual pain, was diagnosed with pancreatitis and gallstones and we were consulted for further workup and plans.  Her liver tests were in fact normal and surgery has been consulted.  She has had no previous GI issues and other than her mother with gallbladder problems, no GI problems run in the family.  PAST MEDICAL HISTORY:  Pertinent for some asthma.  MEDICATIONS AT HOME:  albuterol and Advair and occasional Claritin.  ALLERGIES:  None.  PAST SURGICAL HISTORY:  Bilateral tubal ligation and multiple vaginal deliveries.  SOCIAL HISTORY:  Does not drink or smoke and minimizes drug use.  REVIEW OF SYSTEMS:  Negative except above.  She is passing air from below and is able to keep clear liquids down today.  PHYSICAL EXAMINATION:  GENERAL:  No acute distress. VITAL SIGNS:  Stable, afebrile. HEENT:  Sclerae nonicteric. ABDOMEN:  Soft.  Very minimal right upper quadrant tenderness.  No guarding.  No rebound.  She does even have bowel sounds.  Labs pertinent for lipase of 1825.  Complete metabolic profile is normal including an albumin of 3.8, normal BUN, creatinine, and liver tests. CBC is normal except for hemoglobin of 11.8 with an MCV of 73, normal white count and platelet count.  ASSESSMENT:  Gallstones and mild pancreatitis seemingly.  PLAN:  Since she has no other obvious cause, I would expect gallstones to be the cause, although with her every other day and  frequent pain, we may have missed the liver test bump and possibly her lipase maybe even have been higher in the future.  However, if she resolves quickly, I think a lap chole with intraoperative cholangiogram is the way to proceed.  If there is a question, could consider MRCP and an MRI of the pancreas and possibly even an EUS, but we will follow with you and hopefully she will be better and able to proceed with surgery soon.          ______________________________ Petra Kuba, M.D.     MEM/MEDQ  D:  06/18/2011  T:  06/19/2011  Job:  [id number]  Electronically Signed by Vida Rigger M.D. on 07/18/2011 02:53:02 PM

## 2011-07-31 ENCOUNTER — Encounter (INDEPENDENT_AMBULATORY_CARE_PROVIDER_SITE_OTHER): Payer: Self-pay | Admitting: Surgery

## 2011-08-02 ENCOUNTER — Encounter (INDEPENDENT_AMBULATORY_CARE_PROVIDER_SITE_OTHER): Payer: Self-pay | Admitting: Surgery

## 2011-08-02 ENCOUNTER — Ambulatory Visit (INDEPENDENT_AMBULATORY_CARE_PROVIDER_SITE_OTHER): Payer: Self-pay | Admitting: Surgery

## 2011-08-02 VITALS — BP 120/78 | HR 60 | Temp 96.6°F | Resp 24 | Ht 68.0 in | Wt 236.5 lb

## 2011-08-02 DIAGNOSIS — K859 Acute pancreatitis without necrosis or infection, unspecified: Secondary | ICD-10-CM

## 2011-08-02 DIAGNOSIS — K851 Biliary acute pancreatitis without necrosis or infection: Secondary | ICD-10-CM

## 2011-08-02 NOTE — Patient Instructions (Signed)
Take a fiber supplement daily to help with the bowel movements.  Go to the lab today and have your blood work drawn.  We will call you if there are any significant abnormalities on your blood work.  Follow-up in 2-3 weeks.

## 2011-08-02 NOTE — Progress Notes (Signed)
This patient is status post laparoscopic cholecystectomy on August 21 for gallstone pancreatitis. Her intraoperative cholangiogram showed no flow into the duodenum. She underwent ERCP which did not show any stones in the common duct. Her liver function tests improved and she was discharged home on 8/23. Her main complaint is occasional pain at a specific point in her right upper quadrant which occurs at random times. This is not related to eating. She does have frequent soft bowel movements when eating. She has been trying to avoid any greasy foods. Otherwise she is doing well.  On examination her incisions are all well healed. The area where she is experiencing pain seems to be right at her cholangiogram insertion site.This is right on the edge of the right costal margin. No other abdominal tenderness. Active bowel sounds.  Impression: Status post laparoscopic cholecystectomy and ERCP for gallstone pancreatitis/choledocholithiasis. Now complaining of some intermittent right upper quadrant pain and diarrhea. This pain is likely due to the cholangiogram catheter at the edge of her right costal margin. However, given her history of pancreatitis, I think we should check liver function tests, amylase, and lipase to make sure that these are normal. I also encouraged her to add a fiber supplement to help her with her bowel movements. Recheck in 2 weeks.

## 2011-08-13 NOTE — H&P (Signed)
[name], [name]               ACCOUNT NO.:  [id number]  MEDICAL RECORD NO.:  [id number]  LOCATION:  [facility]                         FACILITY:  [facility]  PHYSICIAN:  Deunta Beneke I Khushboo Chuck, MD      DATE OF BIRTH:  August 14, 1976  DATE OF ADMISSION:  06/18/2011 DATE OF DISCHARGE:                             HISTORY & PHYSICAL   PRIMARY CARE PHYSICIAN:  [facility].  CHIEF COMPLAINT:  Right upper quadrant abdominal pain, nausea, and vomiting for the last 3 months.  HISTORY OF PRESENT ILLNESS:  This is a 35 year old African American female who is status post vacuum-assisted vaginal delivery and status post bilateral tubal ligation with sterilization in May 2012, asthma. The patient admitted she presented to the ED hospital with chief complaint of right upper quadrant abdominal pain radiating to her back and to her right shoulder plate for the last 3 months.  Pain is on and off, but this time she had the worst pain and pain lasting for more hours.  Her abdominal pain started gradually and colicky in nature associated with nausea and bilious vomitus.  Also had a low-grade fever at home.  The patient admitted as this is the worse pain she had experienced within the last 3 months and usually the pain has been every 2-3 weeks.  The patient denies any change in her bowel habits.  Denies any diarrhea.  Denies any chills.  Denies any yellowish discoloration of her eyes.  In the emergency room, the patient was found to have lipase of 1825 and ultrasound suggest cholelithiasis, but no evidence of cholecystitis and hospitalist service asked to admit the patient and further management.  PAST MEDICAL HISTORY:  History of asthma.  MEDICATIONS:  Albuterol and Advair.  ALLERGIES:  No known drug allergies.  PAST SURGICAL HISTORY:  None.  The patient is status post recent vacuum- assisted vaginal delivery and bilateral tubal ligation.  SOCIAL HISTORY:  The patient is not working, has 4 children, lives  with her husband.  Denies any smoking, alcohol abuse, or illicit drug abuse.  FAMILY HISTORY:  Noncontributory.  REVIEW OF SYSTEMS:  The patient denies any headache.  Denies any numbness or weakness of her extremities.  Denies any blurring of vision. Denies any seizure.  RESPIRATORY:  Denies any coughing.  No shortness of breath.  Denies any chest pain.  Denies any orthopnea or paroxysmal nocturnal dyspnea.  Denies any lower extremity swelling.  ABDOMEN: Complaint of right lower quadrant abdominal pain radiating to the back associated with nausea and vomiting.  No change in bowel habit.  Denies any burning with micturition.  Denies any hematuria.  Denies any back pain.  PHYSICAL EXAMINATION:  VITAL SIGNS:  Temperature 97.8, blood pressure 112/71, respiratory rate 16, pulse rate 86, saturation 100% on room air. HEENT:  Normocephalic, atraumatic.  Pupils equal, reactive to light and accommodation. NECK:  Supple.  No lymphadenopathy. HEART:  S1 and S2.  No added sound.  Breast engorged and nipples erected and some milk coming. ABDOMEN:  Soft, not distended, no masses, but there is tenderness at the right hypochondrium.  Murphy sign negative. EXTREMITIES:  Without lower leg lymphedema.  Peripheral pulses intact. CNS:  The patient  is awake, alert, oriented x3 with no focal neuro deficit.  LABORATORY DATA:  Blood workup; hemoglobin 11.8, hematocrit 36.9, MCV 73.4, MCH 23.5.  Urinalysis negative.  Sodium 138, potassium 3.8, chloride 103, CO2 of 27, glucose 104, BUN 13, creatinine 0.61.  LFTs within normal.  Lipase 1825.  Ultrasound shows cholelithiasis, possible sonographic Murphy sign without other ultrasound suggesting cholecystitis.  ASSESSMENT AND PLAN: 1. This is a 35 year old female recently having vacuum-assisted     vaginal delivery with bilateral tubal ligation presented with right     hypochondrium pain and positive lipase suggesting acute     pancreatitis, which is likely  related to gallstone pancreatitis but     with her recent history, I doubt any pelvic inflammatory disease,     no vaginal discharge.  I will proceed with getting CT of abdomen     and pelvis with IV contrast.  We will keep the patient on clear     liquid diet.  Most likely diagnosis is gallstone pancreatitis with     normal LFTs.  We will ask General Surgery and GI to evaluate.  We     will control pain with Dilaudid and will continue IV fluids. 2. Asthma, seems stable. 3. She had recent delivery.  The patient admitted she is not breast-     feeding on a regular basis and would like to wean from breast-     feeding, so we will continue with the current medication and     pharmacy could dose Zosyn.  No plan to consult pharmacy for     checking medication for safety during lactation as the patient     admitted she will not continue breast-feeding her child.  DVT and     GI prophylaxis.     Cordaryl Decelles Bosie Helper, MD     HIE/MEDQ  D:  06/18/2011  T:  06/18/2011  Job:  [id number]  Electronically Signed by Ebony Cargo MD on 08/13/2011 04:30:22 PM

## 2011-08-14 ENCOUNTER — Encounter (INDEPENDENT_AMBULATORY_CARE_PROVIDER_SITE_OTHER): Payer: Self-pay | Admitting: Surgery

## 2011-08-16 ENCOUNTER — Encounter (INDEPENDENT_AMBULATORY_CARE_PROVIDER_SITE_OTHER): Payer: Self-pay | Admitting: Surgery

## 2012-01-01 ENCOUNTER — Emergency Department (HOSPITAL_COMMUNITY): Payer: Self-pay

## 2012-01-01 ENCOUNTER — Emergency Department (HOSPITAL_COMMUNITY)
Admission: EM | Admit: 2012-01-01 | Discharge: 2012-01-02 | Disposition: A | Discharge status: Home / Self Care | Payer: Self-pay | Attending: Internal Medicine | Admitting: Internal Medicine

## 2012-01-01 ENCOUNTER — Encounter (HOSPITAL_COMMUNITY): Payer: Self-pay | Admitting: *Deleted

## 2012-01-01 DIAGNOSIS — Z79899 Other long term (current) drug therapy: Secondary | ICD-10-CM | POA: Insufficient documentation

## 2012-01-01 DIAGNOSIS — D649 Anemia, unspecified: Secondary | ICD-10-CM | POA: Diagnosis present

## 2012-01-01 DIAGNOSIS — E876 Hypokalemia: Secondary | ICD-10-CM | POA: Diagnosis present

## 2012-01-01 DIAGNOSIS — R0682 Tachypnea, not elsewhere classified: Secondary | ICD-10-CM | POA: Insufficient documentation

## 2012-01-01 DIAGNOSIS — J4541 Moderate persistent asthma with (acute) exacerbation: Secondary | ICD-10-CM | POA: Diagnosis present

## 2012-01-01 DIAGNOSIS — J45901 Unspecified asthma with (acute) exacerbation: Secondary | ICD-10-CM | POA: Insufficient documentation

## 2012-01-01 DIAGNOSIS — R079 Chest pain, unspecified: Secondary | ICD-10-CM | POA: Insufficient documentation

## 2012-01-01 DIAGNOSIS — R000 Tachycardia, unspecified: Secondary | ICD-10-CM | POA: Insufficient documentation

## 2012-01-01 HISTORY — DX: Hypokalemia: E87.6

## 2012-01-01 LAB — BLOOD GAS, ARTERIAL
Drawn by: 313061
FIO2: 0.21 %
pCO2 arterial: 33.5 mmHg — ABNORMAL LOW (ref 35.0–45.0)
pO2, Arterial: 54.5 mmHg — ABNORMAL LOW (ref 80.0–100.0)

## 2012-01-01 LAB — BASIC METABOLIC PANEL
Calcium: 8.9 mg/dL (ref 8.4–10.5)
Creatinine, Ser: 0.64 mg/dL (ref 0.50–1.10)
GFR calc Af Amer: >90 mL/min (ref >90)
GFR calc non Af Amer: >90 mL/min (ref >90)

## 2012-01-01 LAB — CBC
HCT: 33.3 % — ABNORMAL LOW (ref 36.0–46.0)
MCHC: 31.2 g/dL (ref 30.0–36.0)
MCV: 74.3 fL — ABNORMAL LOW (ref 78.0–100.0)
RDW: 13 % (ref 11.5–15.5)

## 2012-01-01 LAB — DIFFERENTIAL
Basophils Absolute: 0 10*3/uL (ref 0.0–0.1)
Basophils Relative: 0 % (ref 0–1)
Eosinophils Absolute: 0.1 10*3/uL (ref 0.0–0.7)
Eosinophils Relative: 1 % (ref 0–5)
Monocytes Absolute: 0.2 10*3/uL (ref 0.1–1.0)

## 2012-01-01 LAB — MAGNESIUM: Magnesium: 1.8 mg/dL (ref 1.5–2.5)

## 2012-01-01 MED ORDER — ONDANSETRON HCL 4 MG PO TABS: 4.0000 mg | ORAL_TABLET | Freq: Four times a day (QID) | ORAL | Status: DC | PRN

## 2012-01-01 MED ORDER — SODIUM CHLORIDE 0.9 % IJ SOLN: 3.0000 mL | Freq: Two times a day (BID) | INTRAMUSCULAR | Status: DC

## 2012-01-01 MED ORDER — ALBUTEROL SULFATE (5 MG/ML) 0.5% IN NEBU
2.5000 mg | INHALATION_SOLUTION | Freq: Four times a day (QID) | RESPIRATORY_TRACT | Status: DC
  Administered 2012-01-01 – 2012-01-02 (×3): 2.5 mg via RESPIRATORY_TRACT
  Filled 2012-01-01 (×3): qty 0.5

## 2012-01-01 MED ORDER — PREDNISONE 20 MG PO TABS
60.0000 mg | ORAL_TABLET | Freq: Once | ORAL | Status: AC
  Administered 2012-01-01: 60 mg via ORAL
  Filled 2012-01-01: qty 3

## 2012-01-01 MED ORDER — HYDROCODONE-ACETAMINOPHEN 5-325 MG PO TABS
1.0000 | ORAL_TABLET | ORAL | Status: DC | PRN
  Administered 2012-01-01 – 2012-01-02 (×2): 2 via ORAL
  Filled 2012-01-01 (×2): qty 2

## 2012-01-01 MED ORDER — ALBUTEROL (5 MG/ML) CONTINUOUS INHALATION SOLN
10.0000 mg/h | INHALATION_SOLUTION | RESPIRATORY_TRACT | Status: DC
  Administered 2012-01-01: 10 mg/h via RESPIRATORY_TRACT

## 2012-01-01 MED ORDER — GUAIFENESIN 100 MG/5ML PO SOLN
5.0000 mL | ORAL | Status: DC | PRN
  Administered 2012-01-01: 100 mg via ORAL
  Filled 2012-01-01: qty 5

## 2012-01-01 MED ORDER — IPRATROPIUM BROMIDE 0.02 % IN SOLN
0.5000 mg | Freq: Once | RESPIRATORY_TRACT | Status: AC
  Administered 2012-01-01: 0.5 mg via RESPIRATORY_TRACT
  Filled 2012-01-01: qty 2.5

## 2012-01-01 MED ORDER — PANTOPRAZOLE SODIUM 40 MG PO TBEC
40.0000 mg | DELAYED_RELEASE_TABLET | Freq: Every day | ORAL | Status: DC
  Administered 2012-01-01: 40 mg via ORAL
  Filled 2012-01-01: qty 1

## 2012-01-01 MED ORDER — MORPHINE SULFATE 2 MG/ML IJ SOLN: 1.0000 mg | INTRAMUSCULAR | Status: DC | PRN

## 2012-01-01 MED ORDER — ONDANSETRON HCL 4 MG/2ML IJ SOLN: 4.0000 mg | Freq: Four times a day (QID) | INTRAMUSCULAR | Status: DC | PRN

## 2012-01-01 MED ORDER — ALBUTEROL SULFATE (5 MG/ML) 0.5% IN NEBU
2.5000 mg | INHALATION_SOLUTION | Freq: Once | RESPIRATORY_TRACT | Status: AC
  Administered 2012-01-01: 2.5 mg via RESPIRATORY_TRACT
  Filled 2012-01-01: qty 0.5

## 2012-01-01 MED ORDER — SODIUM CHLORIDE 0.9 % IV SOLN: 250.0000 mL | INTRAVENOUS | Status: DC | PRN

## 2012-01-01 MED ORDER — FERROUS SULFATE 325 (65 FE) MG PO TABS
325.0000 mg | ORAL_TABLET | Freq: Two times a day (BID) | ORAL | Status: DC
  Filled 2012-01-01 (×2): qty 1

## 2012-01-01 MED ORDER — POTASSIUM CHLORIDE CRYS ER 20 MEQ PO TBCR
40.0000 meq | EXTENDED_RELEASE_TABLET | ORAL | Status: AC
  Administered 2012-01-01 – 2012-01-02 (×3): 40 meq via ORAL
  Filled 2012-01-01: qty 2
  Filled 2012-01-01: qty 1
  Filled 2012-01-01: qty 2
  Filled 2012-01-01: qty 1

## 2012-01-01 MED ORDER — FLUTICASONE-SALMETEROL 250-50 MCG/DOSE IN AEPB
1.0000 | INHALATION_SPRAY | Freq: Two times a day (BID) | RESPIRATORY_TRACT | Status: DC
  Administered 2012-01-01 – 2012-01-02 (×2): 1 via RESPIRATORY_TRACT
  Filled 2012-01-01: qty 14

## 2012-01-01 MED ORDER — ACETAMINOPHEN 650 MG RE SUPP: 650.0000 mg | Freq: Four times a day (QID) | RECTAL | Status: DC | PRN

## 2012-01-01 MED ORDER — ALBUTEROL SULFATE (5 MG/ML) 0.5% IN NEBU: 2.5000 mg | INHALATION_SOLUTION | RESPIRATORY_TRACT | Status: DC | PRN

## 2012-01-01 MED ORDER — METHYLPREDNISOLONE SODIUM SUCC 125 MG IJ SOLR
80.0000 mg | Freq: Three times a day (TID) | INTRAMUSCULAR | Status: DC
  Administered 2012-01-01 – 2012-01-02 (×2): 80 mg via INTRAVENOUS
  Filled 2012-01-01 (×2): qty 2

## 2012-01-01 MED ORDER — SODIUM CHLORIDE 0.9 % IJ SOLN: 3.0000 mL | INTRAMUSCULAR | Status: DC | PRN

## 2012-01-01 MED ORDER — SODIUM CHLORIDE 0.9 % IV SOLN
INTRAVENOUS | Status: AC
  Administered 2012-01-01: 400 mL via INTRAVENOUS
  Administered 2012-01-02: 1000 mL via INTRAVENOUS

## 2012-01-01 MED ORDER — IPRATROPIUM BROMIDE 0.02 % IN SOLN
0.5000 mg | Freq: Four times a day (QID) | RESPIRATORY_TRACT | Status: DC
  Administered 2012-01-01 – 2012-01-02 (×3): 0.5 mg via RESPIRATORY_TRACT
  Filled 2012-01-01 (×3): qty 2.5

## 2012-01-01 MED ORDER — ZOLPIDEM TARTRATE 5 MG PO TABS: 5.0000 mg | ORAL_TABLET | Freq: Every evening | ORAL | Status: DC | PRN

## 2012-01-01 MED ORDER — ACETAMINOPHEN 325 MG PO TABS: 650.0000 mg | ORAL_TABLET | Freq: Four times a day (QID) | ORAL | Status: DC | PRN

## 2012-01-01 MED ORDER — MAGNESIUM SULFATE 40 MG/ML IJ SOLN
2.0000 g | Freq: Once | INTRAMUSCULAR | Status: AC
  Administered 2012-01-01: 2 g via INTRAVENOUS
  Filled 2012-01-01: qty 50

## 2012-01-01 NOTE — ED Notes (Signed)
Report received from [place], [place]

## 2012-01-01 NOTE — ED Notes (Signed)
Tammy RT at bedside to start pt's breathing tx

## 2012-01-01 NOTE — H&P (Signed)
PCP:   No primary provider on file.   Chief Complaint:  Shortness of breath, cough increased wheezing  HPI: 36 year old female with a past medical history significant for asthma, anemia (baseline hemoglobin 10.0) and obesity; came to the ED complaining of gradually worsening wheezing, cough, shortness of breath and chest discomfort 2/2 cough. Patient reports that her symptoms has been present and worsening over the last 5-7 days. She denies having any fever, chills, nausea/vomiting, abdominal pain, dysuria or any other acute complaints. 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 Patient has never been intubated/hospitalize for asthma in the past. During this occasion despite using her albuterol/atrovent every 3-4 hours and her continue use of Advair she did no have any improvement and decided to come to the ED for further evaluation and treatment.  Allergies:  No Known Allergies    Past Medical History  Diagnosis Date  . Asthma   . Anemia   . Obesity   . Abdominal pain   . Vomiting   . Diarrhea     Past Surgical History  Procedure Date  . Cholecystectomy 06/19/11    Prior to Admission medications   Medication Sig Start Date End Date Taking? Authorizing Provider  acetaminophen (TYLENOL) 500 MG tablet Take 500 mg by mouth every 6 (six) hours as needed. pain   Yes Historical Provider, MD  albuterol (PROVENTIL HFA;VENTOLIN HFA) 108 (90 BASE) MCG/ACT inhaler Inhale 2 puffs into the lungs every 6 (six) hours as needed. wheezing   Yes Historical Provider, MD  albuterol (PROVENTIL) (2.5 MG/3ML) 0.083% nebulizer solution Take 2.5 mg by nebulization every 6 (six) hours as needed. wheezing   Yes Historical Provider, MD  Fluticasone-Salmeterol (ADVAIR) 250-50 MCG/DOSE AEPB Inhale 1 puff into the lungs every 12 (twelve) hours.     Yes Historical Provider, MD    Social History:  reports that she has never smoked. She has never used  smokeless tobacco. She reports that she does not drink alcohol or use illicit drugs.  Family History  Problem Relation Age of Onset  . Diabetes Mother 53  . Cancer Father 62    lung cancer    Review of Systems:  Negative except as mentioned on history of present illness.   Physical Exam: Blood pressure 142/78, pulse 96, temperature 98.6 F (37 C), temperature source Oral, resp. rate 28, SpO2 99.00%. Constitutional: She is oriented to person, place, and time. She appears well-developed and well-nourished. Mild distress and with difficulty speaking in full sentences.  Head: Normocephalic and atraumatic.  Eyes: PERRLA, extra ocular muscles intact, no icterus.  Neck: Normal range of motion. No thyromegaly. Cardiovascular: Regular rhythm; S1 and S2 appreciated; mild tachycardia after nebulizer treatments. No murmurs or rubs.  Pulmonary/Chest: Fair air movement, tachypneic, patient is speaking in broken sentences due to mild respiratory distress; diffuse expiratory wheezing and no crackles.   Extremities: No edema, no cyanosis or clubbing. There is no tenderness over cough  Neurological: She is alert and oriented to person, place, and time. Cranial nerve 2-12 grossly intact, no focal neurologic deficit appreciated. Skin: Skin is warm and dry. No rash noted.    Labs on Admission:  Results for orders placed during the hospital encounter of 01/01/12 (from the past 48 hour(s))  CBC     Status: Abnormal   Collection Time   01/01/12  4:45 PM      Component Value Range Comment   WBC 7.2  4.0 -  10.5 (K/uL)    RBC 4.48  3.87 - 5.11 (MIL/uL)    Hemoglobin 10.4 (*) 12.0 - 15.0 (g/dL)    HCT 78.2 (*) 95.6 - 46.0 (%)    MCV 74.3 (*) 78.0 - 100.0 (fL)    MCH 23.2 (*) 26.0 - 34.0 (pg)    MCHC 31.2  30.0 - 36.0 (g/dL)    RDW 21.3  08.6 - 57.8 (%)    Platelets 217  150 - 400 (K/uL)   DIFFERENTIAL     Status: Abnormal   Collection Time   01/01/12  4:45 PM      Component Value Range Comment    Neutrophils Relative 79 (*) 43 - 77 (%)    Neutro Abs 5.7  1.7 - 7.7 (K/uL)    Lymphocytes Relative 18  12 - 46 (%)    Lymphs Abs 1.3  0.7 - 4.0 (K/uL)    Monocytes Relative 3  3 - 12 (%)    Monocytes Absolute 0.2  0.1 - 1.0 (K/uL)    Eosinophils Relative 1  0 - 5 (%)    Eosinophils Absolute 0.1  0.0 - 0.7 (K/uL)    Basophils Relative 0  0 - 1 (%)    Basophils Absolute 0.0  0.0 - 0.1 (K/uL)   BASIC METABOLIC PANEL     Status: Abnormal   Collection Time   01/01/12  4:45 PM      Component Value Range Comment   Sodium 140  135 - 145 (mEq/L)    Potassium 3.0 (*) 3.5 - 5.1 (mEq/L)    Chloride 107  96 - 112 (mEq/L)    CO2 21  19 - 32 (mEq/L)    Glucose, Bld 111 (*) 70 - 99 (mg/dL)    BUN 11  6 - 23 (mg/dL)    Creatinine, Ser 4.69  0.50 - 1.10 (mg/dL)    Calcium 8.9  8.4 - 10.5 (mg/dL)    GFR calc non Af Amer >90  >90 (mL/min)    GFR calc Af Amer >90  >90 (mL/min)     Radiological Exams on Admission: No results found.   Assessment/Plan 1-Asthma with acute exacerbation: Failed to improved with treatment in the ED; at this moment will admit for further management; at this moment will check ABG, start iv solumedrol, continue dual nebulizer therapy; give Mg 2 g IV and continue advair. Will follow resp status.  2-Anemia: chronic and most likely due to ongoing menstrual period. Will check ferritin level and start patient on iron.  3-Hypokalemia: Secondary to albuterol use alcohol and also in the ED. Patient without any signs of blood in contraction and no diarrhea. Will replete K and check a magnesium level.  4-GI and DVT prophylaxis: Protonix and Lovenox.   Time Spent on Admission: 45 minutes  Adonus Uselman [facility] [phone]  01/01/2012, 6:56 PM

## 2012-01-01 NOTE — ED Notes (Signed)
Patient was eating doritos, started having increased shortness of breath from chips- water given- shortness of breath relieved.

## 2012-01-01 NOTE — ED Provider Notes (Signed)
History     CSN: [id number]  Arrival date & time 01/01/12  1249   First MD Initiated Contact with Patient 01/01/12 1302      Chief Complaint  Patient presents with  . Asthma    (Consider location/radiation/quality/duration/timing/severity/associated sxs/prior treatment) HPI History provided by pt.   Pt has had gradually worsening wheezing, cough, SOB and diffuse chest pain for the past 4 days.   No associated fever.  Has not had relief w/ albuterol and atrovent.  Sx are similar to an asthma exacerbation with the exception that the chest pain is worse than normal.  No RF for PE and denies LE pain/edema.  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  Has never been intubated/hospitalized for asthma.   Past Medical History  Diagnosis Date  . Asthma   . Anemia   . Obesity   . Abdominal pain   . Vomiting   . Diarrhea     Past Surgical History  Procedure Date  . Cholecystectomy 06/19/11    Family History  Problem Relation Age of Onset  . Diabetes Mother 43  . Cancer Father 95    lung cancer    History  Substance Use Topics  . Smoking status: Never Smoker   . Smokeless tobacco: Never Used  . Alcohol Use: No    OB History    Grav Para Term Preterm Abortions TAB SAB Ect Mult Living   1               Review of Systems  All other systems reviewed and are negative.    Allergies  Review of patient's allergies indicates no known allergies.  Home Medications   Current Outpatient Rx  Name Route Sig Dispense Refill  . ACETAMINOPHEN 500 MG PO TABS Oral Take 500 mg by mouth every 6 (six) hours as needed. pain    . ALBUTEROL SULFATE HFA 108 (90 BASE) MCG/ACT IN AERS Inhalation Inhale 2 puffs into the lungs every 6 (six) hours as needed. wheezing    . ALBUTEROL SULFATE (2.5 MG/3ML) 0.083% IN NEBU Nebulization Take 2.5 mg by nebulization every 6 (six) hours as needed. wheezing    . FLUTICASONE-SALMETEROL 250-50 MCG/DOSE IN AEPB Inhalation Inhale 1 puff into the lungs  every 12 (twelve) hours.        BP 137/102  Pulse 98  Temp(Src) 98.2 F (36.8 C) (Oral)  Resp 20  SpO2 98%  Physical Exam  Nursing note and vitals reviewed. Constitutional: She is oriented to person, place, and time. She appears well-developed and well-nourished. No distress.  HENT:  Head: Normocephalic and atraumatic.  Eyes:       Normal appearance  Neck: Normal range of motion.  Cardiovascular: Regular rhythm and intact distal pulses.        Mild tachycardia  Pulmonary/Chest: Effort normal.       Tachypneic. Mild respiratory distress: pt speaking in broken sentences.  Diffuse expiratory wheezing; more pronounced on right.  Diffuse, mild ttp of chest.   Musculoskeletal:       No peripheral edema or calf tenderness  Neurological: She is alert and oriented to person, place, and time.  Skin: Skin is warm and dry. No rash noted.  Psychiatric: She has a normal mood and affect. Her behavior is normal.    ED Course  Procedures (including critical care time)  Labs Reviewed - No data to display No results found.   1. Asthma exacerbation       MDM  Pt presents with typical asthma exacerbation w/ exception of worse than normal chest tightness.  No known CAD, no RF for PE and no LE edema/tenderness on exam.  Exam sig for no fever, mild respiratory distress, diffuse expiratory wheezing and coughing.  Pt receiving albuterol/atrovent neb as well as prednisone.  Will reassess shortly.    Pt has had two breathing treatments, the second of which was an hour long neb.  Reports that her sx are mildy improved.  On re-examination, 32 breaths/min, coughing, improved expiratory wheezing.  Pt does not appear well enough to go home at this time.  Labs are pending and then Sharolyn Douglas will admit to [facility] for further management.  Pt is comfortable with this plan.         Otilio Miu, [place] 01/01/12 [phone]

## 2012-01-01 NOTE — ED Provider Notes (Signed)
Medical screening examination/treatment/procedure(s) were performed by non-physician practitioner and as supervising physician I was immediately available for consultation/collaboration.  Nicholes Stairs, MD 01/01/12 [phone]

## 2012-01-01 NOTE — ED Provider Notes (Signed)
History     CSN: [id number]  Arrival date & time 01/01/12  1249   First MD Initiated Contact with Patient 01/01/12 1302      Chief Complaint  Patient presents with  . Asthma    (Consider location/radiation/quality/duration/timing/severity/associated sxs/prior treatment) HPI  Past Medical History  Diagnosis Date  . Asthma   . Anemia   . Obesity   . Abdominal pain   . Vomiting   . Diarrhea     Past Surgical History  Procedure Date  . Cholecystectomy 06/19/11    Family History  Problem Relation Age of Onset  . Diabetes Mother 60  . Cancer Father 66    lung cancer    History  Substance Use Topics  . Smoking status: Never Smoker   . Smokeless tobacco: Never Used  . Alcohol Use: No    OB History    Grav Para Term Preterm Abortions TAB SAB Ect Mult Living   1               Review of Systems  Allergies  Review of patient's allergies indicates no known allergies.  Home Medications   Current Outpatient Rx  Name Route Sig Dispense Refill  . ACETAMINOPHEN 500 MG PO TABS Oral Take 500 mg by mouth every 6 (six) hours as needed. pain    . ALBUTEROL SULFATE HFA 108 (90 BASE) MCG/ACT IN AERS Inhalation Inhale 2 puffs into the lungs every 6 (six) hours as needed. wheezing    . ALBUTEROL SULFATE (2.5 MG/3ML) 0.083% IN NEBU Nebulization Take 2.5 mg by nebulization every 6 (six) hours as needed. wheezing    . FLUTICASONE-SALMETEROL 250-50 MCG/DOSE IN AEPB Inhalation Inhale 1 puff into the lungs every 12 (twelve) hours.        BP 142/78  Pulse 96  Temp(Src) 98.6 F (37 C) (Oral)  Resp 28  SpO2 99%  Physical Exam  ED Course  Procedures (including critical care time)  Labs Reviewed  CBC - Abnormal; Notable for the following:    Hemoglobin 10.4 (*)    HCT 33.3 (*)    MCV 74.3 (*)    MCH 23.2 (*)    All other components within normal limits  DIFFERENTIAL - Abnormal; Notable for the following:    Neutrophils Relative 79 (*)    All other components within  normal limits  BASIC METABOLIC PANEL - Abnormal; Notable for the following:    Potassium 3.0 (*)    Glucose, Bld 111 (*)    All other components within normal limits   No results found.   1. Asthma exacerbation       MDM  Patient taken over from [facility], PA-C - plan to admit the patient to [facility] for asthma exacerbation, Team 4 - regular bed.        Izola Price [place], [place] 01/01/12 1831

## 2012-01-01 NOTE — ED Provider Notes (Signed)
Medical screening examination/treatment/procedure(s) were conducted as a shared visit with non-physician practitioner(s) and myself.  I personally evaluated the patient during the encounter  [name], MD 01/01/12 1739

## 2012-01-01 NOTE — ED Provider Notes (Signed)
Complains of wheezing typical of asthma over the past week progressively worsening feels improved since treatment in the emergency department but not at baseline on exam speaks in sentences lungs with prolonged expiratory phase and expiratory wheezes  [name], MD 01/01/12 1550

## 2012-01-01 NOTE — ED Notes (Signed)
tammy RT at bedside to start hour neb as ordered

## 2012-01-01 NOTE — ED Notes (Signed)
Pt in c/o asthma attack, wheezing over last few days, audible wheezing noted, pt speaking in short phrases

## 2012-01-02 LAB — GLUCOSE, CAPILLARY: Glucose-Capillary: 133 mg/dL — ABNORMAL HIGH (ref 70–99)

## 2012-01-02 LAB — FERRITIN: Ferritin: 26 ng/mL (ref 10–291)

## 2012-01-02 LAB — BASIC METABOLIC PANEL
CO2: 19 mEq/L (ref 19–32)
Chloride: 109 mEq/L (ref 96–112)
Creatinine, Ser: 0.66 mg/dL (ref 0.50–1.10)
Potassium: 4.2 mEq/L (ref 3.5–5.1)

## 2012-01-02 LAB — HEMOGLOBIN A1C: Mean Plasma Glucose: 105 mg/dL (ref <117)

## 2012-01-02 MED ORDER — DIPHENHYDRAMINE HCL 25 MG PO CAPS
25.0000 mg | ORAL_CAPSULE | Freq: Three times a day (TID) | ORAL | Status: DC | PRN
  Administered 2012-01-02: 25 mg via ORAL
  Filled 2012-01-02: qty 1

## 2012-01-02 MED ORDER — PREDNISONE (PAK) 10 MG PO TABS: 10.0000 mg | ORAL_TABLET | Freq: Every day | ORAL | Status: AC

## 2012-01-02 NOTE — Progress Notes (Signed)
[facility] ED registration updated that pt has medicaid

## 2012-01-02 NOTE — Progress Notes (Signed)
Dr Arthor Captain spoke with ED CM about pt needing a pcp.  Pt is a medicaid pt who states the assigned pcp on her medicaid card is no longer excepting patients She has been making attempts to contact DSS for get assistance but has not been able to reach anyone.  Pt provided with a list of available medicaid pcp she is aware of pt to review list.  ED CM also assisted with contacting health connect to get a list of medicaid accepting PCP near pt zip code with pt permission

## 2012-01-02 NOTE — ED Notes (Signed)
Patient compliants of itching from pain medication that was given earlier. Will make md aware for prn medication  To treat.

## 2012-01-02 NOTE — Progress Notes (Signed)
ED CM awaiting call from health connect staff. Pt updated. She prefers d/c home today with husband and she and Cm will continue to work toward getting her DSS worker to enter new Medicaid provider so pt can be seen.  Cm provided pt with CM contact information.  Pt voiced understanding and appreciation of services and resources provided.

## 2012-01-02 NOTE — ED Notes (Signed)
MD at bedside. 

## 2012-01-02 NOTE — Discharge Summary (Signed)
HOSPITAL DISCHARGE SUMMARY  [name]  MRN: [id number]  DOB:[birth year]  Date of Admission: 01/01/2012 Date of Discharge: 01/02/2012         LOS: 1 day   Attending Physician:  Clydia Llano A  Patient's PCP:   Has Medicaid, she will follow up with PCP  Consults: None  Discharge Diagnoses: Present on Admission:  .Asthma with acute exacerbation .Anemia .Hypokalemia   Medication List  As of 01/02/2012 12:04 PM   TAKE these medications         acetaminophen 500 MG tablet   Commonly known as: TYLENOL   Take 500 mg by mouth every 6 (six) hours as needed. pain      albuterol (2.5 MG/3ML) 0.083% nebulizer solution   Commonly known as: PROVENTIL   Take 2.5 mg by nebulization every 6 (six) hours as needed. wheezing      albuterol 108 (90 BASE) MCG/ACT inhaler   Commonly known as: PROVENTIL HFA;VENTOLIN HFA   Inhale 2 puffs into the lungs every 6 (six) hours as needed. wheezing      Fluticasone-Salmeterol 250-50 MCG/DOSE Aepb   Commonly known as: ADVAIR   Inhale 1 puff into the lungs every 12 (twelve) hours.      predniSONE 10 MG tablet   Commonly known as: STERAPRED UNI-PAK   Take 1 tablet (10 mg total) by mouth daily. Take 6-5-4-3-2-1 Tablets po till gone             Brief Admission History: 36 year old female with a past medical history significant for asthma, anemia (baseline hemoglobin 10.0) and obesity; came to the ED complaining of gradually worsening wheezing, cough, shortness of breath and chest discomfort 2/2 cough. Patient reports that her symptoms has been present and worsening over the last 5-7 days. She denies having any fever, chills, nausea/vomiting, abdominal pain, dysuria or any other acute complaints. 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 Patient has never been intubated/hospitalize for asthma in the past. During this occasion despite using her albuterol/atrovent every 3-4 hours and her  continue use of Advair she did no have any improvement and decided to come to the ED for further evaluation and treatment.  Hospital Course: Present on Admission:  .Asthma with acute exacerbation .Anemia .Hypokalemia  1. Acute asthma exacerbation: Patient admitted to the hospital overnight, she was having progressive worsening of shortness of breath as well as wheezing for the past one week. At the time of admission she was placed on continuous nebulizer in the emergency department without any change in her wheezes. Even an ABG were done and showed pH of 7.39 and PCO2 of 33 and PO2 of 54. Patient was given a high dose of steroids and nebulization as needed. In the morning patient does not have any wheezes and she wanted to go home. Paperwork were done patient discharged home on prednisone taper.  2. Anemia: patient has microcytic anemia with hemoglobin baseline of 10. Her MCV is 73. This is likely secondary to iron deficiency anemia. Patient needs outpatient followup with her primary care physician.   Day of Discharge BP 122/69  Pulse 72  Temp(Src) 98.2 F (36.8 C) (Oral)  Resp 20  Wt 98.884 kg (218 lb)  SpO2 96% Physical Exam: GEN: No acute distress, cooperative with exam PSYCH: He is alert and oriented x4; does not appear anxious does not appear depressed; affect is normal  HEENT: Mucous membranes pink and anicteric;  Mouth: without oral thrush  or lesions Eyes: PERRLA; EOM intact;  Neck: no cervical lymphadenopathy nor thyromegaly or carotid bruit; no JVD;  CHEST WALL: No tenderness, symmetrical to breathing bilaterally CHEST: Normal respiration, clear to auscultation bilaterally  HEART: Regular rate and rhythm; no murmurs, rubs or gallops, S1 and S2 heard  BACK: No kyphosis or scoliosis; no CVA tenderness  ABDOMEN:  soft non-tender; no masses, no organomegaly, normal abdominal bowel sounds; no pannus; no intertriginous candida.  EXTREMITIES: No bone or joint deformity; no edema; no  ulcerations.  PULSES: 2+ and symmetric, neurovascularity is intact SKIN: Normal hydration no rash or ulceration, no flushing or suspicious lesions  CNS: Cranial nerves 2-12 grossly intact no focal neurologic deficit, coordination is intact gait not tested    Results for orders placed during the hospital encounter of 01/01/12 (from the past 24 hour(s))  CBC     Status: Abnormal   Collection Time   01/01/12  4:45 PM      Component Value Range   WBC 7.2  4.0 - 10.5 (K/uL)   RBC 4.48  3.87 - 5.11 (MIL/uL)   Hemoglobin 10.4 (*) 12.0 - 15.0 (g/dL)   HCT 45.4 (*) 09.8 - 46.0 (%)   MCV 74.3 (*) 78.0 - 100.0 (fL)   MCH 23.2 (*) 26.0 - 34.0 (pg)   MCHC 31.2  30.0 - 36.0 (g/dL)   RDW 11.9  14.7 - 82.9 (%)   Platelets 217  150 - 400 (K/uL)  DIFFERENTIAL     Status: Abnormal   Collection Time   01/01/12  4:45 PM      Component Value Range   Neutrophils Relative 79 (*) 43 - 77 (%)   Neutro Abs 5.7  1.7 - 7.7 (K/uL)   Lymphocytes Relative 18  12 - 46 (%)   Lymphs Abs 1.3  0.7 - 4.0 (K/uL)   Monocytes Relative 3  3 - 12 (%)   Monocytes Absolute 0.2  0.1 - 1.0 (K/uL)   Eosinophils Relative 1  0 - 5 (%)   Eosinophils Absolute 0.1  0.0 - 0.7 (K/uL)   Basophils Relative 0  0 - 1 (%)   Basophils Absolute 0.0  0.0 - 0.1 (K/uL)  BASIC METABOLIC PANEL     Status: Abnormal   Collection Time   01/01/12  4:45 PM      Component Value Range   Sodium 140  135 - 145 (mEq/L)   Potassium 3.0 (*) 3.5 - 5.1 (mEq/L)   Chloride 107  96 - 112 (mEq/L)   CO2 21  19 - 32 (mEq/L)   Glucose, Bld 111 (*) 70 - 99 (mg/dL)   BUN 11  6 - 23 (mg/dL)   Creatinine, Ser 5.62  0.50 - 1.10 (mg/dL)   Calcium 8.9  8.4 - 13.0 (mg/dL)   GFR calc non Af Amer >90  >90 (mL/min)   GFR calc Af Amer >90  >90 (mL/min)  FERRITIN     Status: Normal   Collection Time   01/01/12  4:45 PM      Component Value Range   Ferritin 26  10 - 291 (ng/mL)  MAGNESIUM     Status: Normal   Collection Time   01/01/12  4:45 PM      Component Value  Range   Magnesium 1.8  1.5 - 2.5 (mg/dL)  PHOSPHORUS     Status: Abnormal   Collection Time   01/01/12  4:45 PM      Component Value Range   Phosphorus 1.4 (*)  2.3 - 4.6 (mg/dL)  TSH     Status: Normal   Collection Time   01/01/12  4:45 PM      Component Value Range   TSH 0.444  0.350 - 4.500 (uIU/mL)  HEMOGLOBIN A1C     Status: Normal   Collection Time   01/01/12  4:45 PM      Component Value Range   Hemoglobin A1C 5.3  <5.7 (%)   Mean Plasma Glucose 105  <117 (mg/dL)  BLOOD GAS, ARTERIAL     Status: Abnormal   Collection Time   01/01/12  7:30 PM      Component Value Range   FIO2 0.21     pH, Arterial 7.395  7.350 - 7.400    pCO2 arterial 33.5 (*) 35.0 - 45.0 (mmHg)   pO2, Arterial 54.5 (*) 80.0 - 100.0 (mmHg)   Bicarbonate 20.1  20.0 - 24.0 (mEq/L)   TCO2 18.6  0 - 100 (mmol/L)   Acid-base deficit 3.7 (*) 0.0 - 2.0 (mmol/L)   O2 Saturation 86.6     Patient temperature 98.6     Collection site RIGHT RADIAL     Drawn by [id number]     Sample type ARTERIAL DRAW     Allens test (pass/fail) PASS  PASS   BASIC METABOLIC PANEL     Status: Abnormal   Collection Time   01/02/12  5:26 AM      Component Value Range   Sodium 137  135 - 145 (mEq/L)   Potassium 4.2  3.5 - 5.1 (mEq/L)   Chloride 109  96 - 112 (mEq/L)   CO2 19  19 - 32 (mEq/L)   Glucose, Bld 156 (*) 70 - 99 (mg/dL)   BUN 9  6 - 23 (mg/dL)   Creatinine, Ser 0.45  0.50 - 1.10 (mg/dL)   Calcium 8.6  8.4 - 40.9 (mg/dL)   GFR calc non Af Amer >90  >90 (mL/min)   GFR calc Af Amer >90  >90 (mL/min)  GLUCOSE, CAPILLARY     Status: Abnormal   Collection Time   01/02/12  7:58 AM      Component Value Range   Glucose-Capillary 133 (*) 70 - 99 (mg/dL)   Comment 1 Notify RN     Comment 2 Documented in Chart      Disposition: home   Follow-up Appts:     I spent 40 minutes completing paperwork and coordinating discharge efforts.  SignedClydia Llano A 01/02/2012, 12:04 PM

## 2012-01-02 NOTE — ED Notes (Signed)
Patient seen by case manager.

## 2012-01-02 NOTE — ED Notes (Signed)
Patient discharged to home in stable condition, no complaints of shortness of breath or difficulty breathing.

## 2012-01-02 NOTE — Progress Notes (Signed)
Talked to patient about Medicaid - her Medicaid care has been rejected; instructed patient to call the number on the back of her medicaid card to see how to activate her medicaid card. Patient needs a PCP, does not want to go to [facility]; information given to the patient about the [facility] and information about the [facility] on [address] Dr Bari Mantis is accepting new patient also. Patient stated that she would decide which clinic / PCP to go to. Abelino Derrick RN, BSN, MHA

## 2012-01-02 NOTE — Progress Notes (Signed)
ED CM had return call from Amy at 1317 from [facility]. CM conference pt in call.  Confirmed pt has Medicaid [place] access.  Pt will be mailed a list of providers to her home address.  Pt appreciative of services and will call CM if issues arises.

## 2013-06-09 ENCOUNTER — Encounter (HOSPITAL_COMMUNITY): Payer: Self-pay | Admitting: Emergency Medicine

## 2013-06-09 ENCOUNTER — Emergency Department (HOSPITAL_COMMUNITY): Payer: Self-pay

## 2013-06-09 ENCOUNTER — Inpatient Hospital Stay (HOSPITAL_COMMUNITY)
Admission: EM | Admit: 2013-06-09 | Discharge: 2013-06-12 | DRG: 202 | Disposition: A | Discharge status: Home / Self Care | Payer: MEDICAID | Attending: Internal Medicine | Admitting: Internal Medicine

## 2013-06-09 DIAGNOSIS — Z7982 Long term (current) use of aspirin: Secondary | ICD-10-CM

## 2013-06-09 DIAGNOSIS — R0602 Shortness of breath: Secondary | ICD-10-CM | POA: Diagnosis present

## 2013-06-09 DIAGNOSIS — J9600 Acute respiratory failure, unspecified whether with hypoxia or hypercapnia: Secondary | ICD-10-CM

## 2013-06-09 DIAGNOSIS — J45901 Unspecified asthma with (acute) exacerbation: Principal | ICD-10-CM | POA: Diagnosis present

## 2013-06-09 DIAGNOSIS — K851 Biliary acute pancreatitis without necrosis or infection: Secondary | ICD-10-CM

## 2013-06-09 DIAGNOSIS — E876 Hypokalemia: Secondary | ICD-10-CM

## 2013-06-09 DIAGNOSIS — D649 Anemia, unspecified: Secondary | ICD-10-CM | POA: Diagnosis present

## 2013-06-09 DIAGNOSIS — R071 Chest pain on breathing: Secondary | ICD-10-CM | POA: Diagnosis present

## 2013-06-09 DIAGNOSIS — IMO0002 Reserved for concepts with insufficient information to code with codable children: Secondary | ICD-10-CM

## 2013-06-09 DIAGNOSIS — R0781 Pleurodynia: Secondary | ICD-10-CM

## 2013-06-09 HISTORY — DX: Shortness of breath: R06.02

## 2013-06-09 LAB — BASIC METABOLIC PANEL
CO2: 20 mEq/L (ref 19–32)
Chloride: 104 mEq/L (ref 96–112)
Creatinine, Ser: 0.79 mg/dL (ref 0.50–1.10)
GFR calc Af Amer: >90 mL/min (ref >90)
Potassium: 3.9 mEq/L (ref 3.5–5.1)
Sodium: 137 mEq/L (ref 135–145)

## 2013-06-09 LAB — CBC
MCV: 73.6 fL — ABNORMAL LOW (ref 78.0–100.0)
Platelets: 177 10*3/uL (ref 150–400)
RBC: 4.59 MIL/uL (ref 3.87–5.11)
WBC: 8.4 10*3/uL (ref 4.0–10.5)

## 2013-06-09 LAB — POCT I-STAT 3, ART BLOOD GAS (G3+)
O2 Saturation: 100 %
pCO2 arterial: 32.9 mmHg — ABNORMAL LOW (ref 35.0–45.0)
pO2, Arterial: 185 mmHg — ABNORMAL HIGH (ref 80.0–100.0)

## 2013-06-09 LAB — POCT I-STAT TROPONIN I: Troponin i, poc: 0 ng/mL (ref 0.00–0.08)

## 2013-06-09 MED ORDER — PROCHLORPERAZINE EDISYLATE 5 MG/ML IJ SOLN
10.0000 mg | Freq: Four times a day (QID) | INTRAMUSCULAR | Status: DC | PRN
  Administered 2013-06-09: 10 mg via INTRAVENOUS
  Filled 2013-06-09 (×2): qty 2

## 2013-06-09 MED ORDER — IPRATROPIUM BROMIDE 0.02 % IN SOLN
RESPIRATORY_TRACT | Status: AC
  Administered 2013-06-09: 0.5 mg
  Filled 2013-06-09: qty 2.5

## 2013-06-09 MED ORDER — IOHEXOL 350 MG/ML SOLN
100.0000 mL | Freq: Once | INTRAVENOUS | Status: AC | PRN
  Administered 2013-06-09: 100 mL via INTRAVENOUS

## 2013-06-09 MED ORDER — MORPHINE SULFATE 4 MG/ML IJ SOLN
4.0000 mg | Freq: Once | INTRAMUSCULAR | Status: AC
  Administered 2013-06-09: 4 mg via INTRAVENOUS
  Filled 2013-06-09: qty 1

## 2013-06-09 MED ORDER — ALBUTEROL SULFATE (5 MG/ML) 0.5% IN NEBU
INHALATION_SOLUTION | RESPIRATORY_TRACT | Status: AC
  Filled 2013-06-09: qty 1

## 2013-06-09 MED ORDER — SODIUM CHLORIDE 0.9 % IV BOLUS (SEPSIS)
1000.0000 mL | INTRAVENOUS | Status: AC
  Administered 2013-06-09: 1000 mL via INTRAVENOUS

## 2013-06-09 MED ORDER — METHYLPREDNISOLONE SODIUM SUCC 125 MG IJ SOLR
INTRAMUSCULAR | Status: AC
  Administered 2013-06-09: 125 mg via INTRAVENOUS
  Filled 2013-06-09: qty 2

## 2013-06-09 MED ORDER — ALBUTEROL SULFATE (5 MG/ML) 0.5% IN NEBU
5.0000 mg | INHALATION_SOLUTION | Freq: Once | RESPIRATORY_TRACT | Status: AC
  Administered 2013-06-09: 5 mg via RESPIRATORY_TRACT

## 2013-06-09 MED ORDER — METHYLPREDNISOLONE SODIUM SUCC 125 MG IJ SOLR: 125.0000 mg | Freq: Once | INTRAMUSCULAR | Status: AC

## 2013-06-09 MED ORDER — MORPHINE SULFATE 2 MG/ML IJ SOLN
2.0000 mg | Freq: Once | INTRAMUSCULAR | Status: AC
  Administered 2013-06-09: 2 mg via INTRAVENOUS
  Filled 2013-06-09: qty 1

## 2013-06-09 MED ORDER — ALBUTEROL (5 MG/ML) CONTINUOUS INHALATION SOLN
INHALATION_SOLUTION | RESPIRATORY_TRACT | Status: AC
  Administered 2013-06-09: 17:00:00
  Filled 2013-06-09: qty 20

## 2013-06-09 MED ORDER — ALBUTEROL SULFATE (5 MG/ML) 0.5% IN NEBU
2.5000 mg | INHALATION_SOLUTION | Freq: Once | RESPIRATORY_TRACT | Status: AC
  Administered 2013-06-09: 2.5 mg via RESPIRATORY_TRACT
  Filled 2013-06-09: qty 0.5

## 2013-06-09 NOTE — ED Notes (Signed)
Respiratory at bedside.

## 2013-06-09 NOTE — ED Notes (Signed)
Chest pain x2 weeks. Symptoms worse since Sunday. Upper left chest pain, sob.

## 2013-06-09 NOTE — H&P (Signed)
Chief Complaint:  sob  HPI: 37 yo female with h/o asthma comes in with several days of worsening sob and cp.  No fevers.  No cough.  Pt has been wheezing some.  Also with pain left side of chest worse with deep inspiration.  Worse when she lays down and her sob gets worse also at night.  No hemoptysis.  Reports both of her legs have also been swelling on/off for several days, not really swollen now.  She has received solumedrol and several nebs in ED and is still very sob.  Her lung exam is clear right now, but reported earlier by edp to be tight and wheezy.  No recent surgeries, trauma or illnesses.  No recent traveling.  Not on estrogen.  S/p btl so no pregnancy.  Review of Systems:  Positive and negative as per HPI otherwise all other systems are negative  Past Medical History: Past Medical History  Diagnosis Date  . Asthma   . Anemia   . Obesity   . Abdominal pain   . Vomiting   . Diarrhea    Past Surgical History  Procedure Laterality Date  . Cholecystectomy  06/19/11    Medications: Prior to Admission medications   Medication Sig Start Date End Date Taking? Authorizing Provider  acetaminophen (TYLENOL) 500 MG tablet Take 1,000 mg by mouth every 6 (six) hours as needed. pain   Yes Historical Provider, MD  albuterol (PROVENTIL HFA;VENTOLIN HFA) 108 (90 BASE) MCG/ACT inhaler Inhale 2 puffs into the lungs every 6 (six) hours as needed. wheezing   Yes Historical Provider, MD  albuterol (PROVENTIL) (2.5 MG/3ML) 0.083% nebulizer solution Take 2.5 mg by nebulization every 6 (six) hours as needed. wheezing   Yes Historical Provider, MD  aspirin-acetaminophen-caffeine (EXCEDRIN MIGRAINE) [phone] MG per tablet Take 2 tablets by mouth every 6 (six) hours as needed for pain.   Yes Historical Provider, MD  Fluticasone-Salmeterol (ADVAIR) 250-50 MCG/DOSE AEPB Inhale 1 puff into the lungs every 12 (twelve) hours.     Yes Historical Provider, MD    Allergies:  No Known Allergies  Social  History:  reports that she has never smoked. She has never used smokeless tobacco. She reports that she does not drink alcohol or use illicit drugs.  Family History: Family History  Problem Relation Age of Onset  . Diabetes Mother 52  . Cancer Father 83    lung cancer    Physical Exam: Filed Vitals:   06/09/13 1741 06/09/13 1755 06/09/13 1800 06/09/13 1900  BP: 125/69  134/59 127/64  Pulse:  100 95 118  Temp:      TempSrc:      Resp: 20 17 20 27   SpO2: 100% 99% 100% 100%   General appearance: alert, cooperative and mild distress tachypneic Head: Normocephalic, without obvious abnormality, atraumatic Eyes: negative Nose: Nares normal. Septum midline. Mucosa normal. No drainage or sinus tenderness. Neck: no JVD and supple, symmetrical, trachea midline Lungs: clear to auscultation bilaterally Heart: regular rate and rhythm, S1, S2 normal, no murmur, click, rub or gallop Abdomen: soft, non-tender; bowel sounds normal; no masses,  no organomegaly Extremities: extremities normal, atraumatic, no cyanosis or edema Pulses: 2+ and symmetric Skin: Skin color, texture, turgor normal. No rashes or lesions Neurologic: Grossly normal  Labs on Admission:   Recent Labs  06/09/13 1638  NA 137  K 3.9  CL 104  CO2 20  GLUCOSE 95  BUN 11  CREATININE 0.79  CALCIUM 9.1    Recent Labs  06/09/13 1638  WBC 8.4  HGB 10.8*  HCT 33.8*  MCV 73.6*  PLT 177   Radiological Exams on Admission: Dg Chest 2 View  06/09/2013   *RADIOLOGY REPORT*  Clinical Data: Chest pain, asthma, shortness of breath  CHEST - 2 VIEW  Comparison: 01/01/2012  Findings: Very low lung volumes accentuating the vascular markings with basilar atelectasis.  Normal heart size and vascularity. Negative for pneumonia, collapse or consolidation.  No effusion or pneumothorax.  Trachea midline.  IMPRESSION: Low volume exam with vascular congestion and basilar atelectasis   Original Report Authenticated By: Judie Petit. Miles Costain, M.D.     Assessment/Plan  37 yo female with tachycardia, tachpnea, pleuritic cp with h/o asthma Principal Problem:   Acute respiratory failure Active Problems:   Asthma with acute exacerbation   Anemia   SOB (shortness of breath)   Chest pain, pleuritic  Iv solumedrol.  freq nebs.  Will also do cta r/o PE due to above history.  If neg for PE pt can go to tele, if not will have to go to stepdown overnight.  romi and ck echo.  Full code.  Melvyn Hommes A 06/09/2013, 9:30 PM

## 2013-06-09 NOTE — ED Provider Notes (Signed)
CSN: [id number]     Arrival date & time 06/09/13  1522 History     First MD Initiated Contact with Patient 06/09/13 1644     Chief Complaint  Patient presents with  . Chest Pain   (Consider location/radiation/quality/duration/timing/severity/associated sxs/prior Treatment) The history is provided by the patient.   37 year old female with a history of asthma presents with shortness of breath consistent with her as exacerbations. Patient reports shortness of breath began last night, and has worsened throughout the day.  Initial history is limited initially patient's acute dyspnea and tachypnea. Patient is accompanied by a worker to the emergency department also reports that she's had chest pain. A later history patient does report chest pain for 2 weeks located in the upper left chest describes as a feeling of "gas." Has tried several medications and not helped. Denies any history of PE, DVT, family history PE/DVT, estrogen use, hemoptysis, asymmetric leg swelling or pain. Denies any fevers. Denies a history of intubation for asthma. Reports the symptoms are exactly like her asthma exacerbations, however this was not than the severe exacerbation she can't. Reports she has been hospitalized before and this severity is similar to that.  Rates 9/10 on scale of her asthma exacerbations. Dyspnea is severe at time of initial presentation.  Reports asthma exacerbations are brought on by anxiety or environmental exposures.    Past Medical History  Diagnosis Date  . Asthma   . Anemia   . Obesity   . Abdominal pain   . Vomiting   . Diarrhea   . Shortness of breath    Past Surgical History  Procedure Laterality Date  . Cholecystectomy  06/19/11   Family History  Problem Relation Age of Onset  . Diabetes Mother 57  . Cancer Father 45    lung cancer   History  Substance Use Topics  . Smoking status: Never Smoker   . Smokeless tobacco: Never Used  . Alcohol Use: No   OB History   Grav Para  Term Preterm Abortions TAB SAB Ect Mult Living   1              Review of Systems  Unable to perform ROS: Acuity of condition  Constitutional: Negative for fever.  HENT: Negative for congestion, sore throat and rhinorrhea.   Respiratory: Positive for shortness of breath. Negative for cough.   Cardiovascular: Positive for chest pain.  Gastrointestinal: Negative for abdominal pain.  Genitourinary: Negative for difficulty urinating.  Musculoskeletal: Negative for back pain.  Skin: Negative for rash.  Neurological: Negative for syncope and headaches.    Allergies  Review of patient's allergies indicates no known allergies.  Home Medications   No current outpatient prescriptions on file. BP 122/52  Pulse 82  Temp(Src) 98.4 F (36.9 C) (Oral)  Resp 18  SpO2 100%  LMP 05/11/2013 Physical Exam  Nursing note and vitals reviewed. Constitutional: She is oriented to person, place, and time. She appears well-developed and well-nourished. She appears ill. She appears distressed. Nasal cannula in place.  HENT:  Head: Normocephalic and atraumatic.  Mouth/Throat: Oropharynx is clear and moist. No oropharyngeal exudate.  Eyes: Conjunctivae and EOM are normal.  Neck: Normal range of motion. No tracheal deviation present.  Cardiovascular: Normal rate, regular rhythm, normal heart sounds and intact distal pulses.  Exam reveals no gallop and no friction rub.   No murmur heard. Pulmonary/Chest: Effort normal. No respiratory distress. She has wheezes (occasional end expiratory wheeze, decreased BS on expiration). She has no  rales. She exhibits no tenderness.  Abdominal: Soft. She exhibits no distension. There is no tenderness. There is no guarding.  Musculoskeletal: She exhibits no edema and no tenderness.  Neurological: She is alert and oriented to person, place, and time.  Skin: Skin is warm and dry. No rash noted. She is not diaphoretic. No erythema.    ED Course   Procedures (including  critical care time)  Labs Reviewed  CBC - Abnormal; Notable for the following:    Hemoglobin 10.8 (*)    HCT 33.8 (*)    MCV 73.6 (*)    MCH 23.5 (*)    All other components within normal limits  BASIC METABOLIC PANEL - Abnormal; Notable for the following:    Glucose, Bld 174 (*)    All other components within normal limits  CBC - Abnormal; Notable for the following:    Hemoglobin 9.9 (*)    HCT 30.8 (*)    MCV 73.7 (*)    MCH 23.7 (*)    All other components within normal limits  POCT I-STAT 3, BLOOD GAS (G3+) - Abnormal; Notable for the following:    pCO2 arterial 32.9 (*)    pO2, Arterial 185.0 (*)    Bicarbonate 19.5 (*)    Acid-base deficit 5.0 (*)    All other components within normal limits  BASIC METABOLIC PANEL  D-DIMER, QUANTITATIVE  TROPONIN I  POCT I-STAT TROPONIN I   Dg Chest 2 View  06/09/2013   *RADIOLOGY REPORT*  Clinical Data: Chest pain, asthma, shortness of breath  CHEST - 2 VIEW  Comparison: 01/01/2012  Findings: Very low lung volumes accentuating the vascular markings with basilar atelectasis.  Normal heart size and vascularity. Negative for pneumonia, collapse or consolidation.  No effusion or pneumothorax.  Trachea midline.  IMPRESSION: Low volume exam with vascular congestion and basilar atelectasis   Original Report Authenticated By: Judie Petit. Miles Costain, M.D.   Ct Angio Chest Pe W/cm &/or Wo Cm  06/09/2013   *RADIOLOGY REPORT*  Clinical Data: Shortness of breath and chest pain.  CT ANGIOGRAPHY CHEST  Technique:  Multidetector CT imaging of the chest using the standard protocol during bolus administration of intravenous contrast. Multiplanar reconstructed images including MIPs were obtained and reviewed to evaluate the vascular anatomy.  Contrast: OMNIPAQUE IOHEXOL 350 MG/ML SOLN  Comparison: No priors.  Findings:  Mediastinum: There are no filling defects within the pulmonary arterial tree to suggest underlying pulmonary embolism. Heart size is mildly enlarged.  There is no significant pericardial fluid, thickening or pericardial calcification. No pathologically enlarged mediastinal or hilar lymph nodes. Esophagus is unremarkable in appearance.  Lungs/Pleura: Minimal dependent atelectasis throughout the lung bases bilaterally.  No consolidative airspace disease.  No pleural effusions.  No suspicious appearing pulmonary nodules or masses.  Upper Abdomen: Status post cholecystectomy.  Musculoskeletal: There are no aggressive appearing lytic or blastic lesions noted in the visualized portions of the skeleton.  IMPRESSION: 1.  No evidence of pulmonary embolism. 2.  No acute findings in the thorax to account for the patient's symptoms. 3.  Status post cholecystectomy. 4.  Mild cardiomegaly.   Original Report Authenticated By: Trudie Reed, M.D.   1. Acute respiratory failure   2. Anemia   3. Chest pain, pleuritic   4. SOB (shortness of breath)   5. Asthma with acute exacerbation, unspecified asthma severity     MDM  37 year old female with a history of asthma presents with shortness of breath consistent with her as exacerbations.  Patient  arrives to the emergency department tachypnea, speaking in one-word answers, and immediately hooked up to the monitors with normal oxygenation noted. Occasional wheezing noted on exam. Patient states is consistent with her past asthma exacerbations. 125mg  Solu-Medrol was given as well as duonebs in triage. Patient was put on half an hour of continuous nebulizer with improvement of symptoms.  Chest x-ray shows no signs of pneumonia. Patient also reports chest pain of 2 weeks duration, with negative initial troponin and have low suspicion for ECS as cause of symptoms. Low suspicion for PE given as an exacerbation more likely based off of symptoms, and patient without risk factors.  Patient continued SOB and given albuterol neb and on reevaluation had temporary improvement however continued dyspnea similar to level she has had with  past hospitalizations.   Called hospitalist for admission.  CT PE study and ddimer done per hospitalist within normal limits and pt admitted in stable condition.    Rhae Lerner, MD 06/10/13 1716  Rhae Lerner, MD 06/10/13 1719

## 2013-06-09 NOTE — ED Notes (Signed)
MD at bedside. 

## 2013-06-09 NOTE — ED Notes (Signed)
Co-worker at the bedside

## 2013-06-10 ENCOUNTER — Encounter (HOSPITAL_COMMUNITY): Payer: Self-pay | Admitting: General Practice

## 2013-06-10 DIAGNOSIS — R072 Precordial pain: Secondary | ICD-10-CM

## 2013-06-10 LAB — BASIC METABOLIC PANEL
CO2: 19 mEq/L (ref 19–32)
Calcium: 8.5 mg/dL (ref 8.4–10.5)
Creatinine, Ser: 0.61 mg/dL (ref 0.50–1.10)
GFR calc non Af Amer: >90 mL/min (ref >90)
Sodium: 136 mEq/L (ref 135–145)

## 2013-06-10 LAB — CBC
MCV: 73.7 fL — ABNORMAL LOW (ref 78.0–100.0)
Platelets: 185 10*3/uL (ref 150–400)
RDW: 13.2 % (ref 11.5–15.5)
WBC: 8.6 10*3/uL (ref 4.0–10.5)

## 2013-06-10 LAB — TROPONIN I: Troponin I: <0.3 ng/mL (ref <0.30)

## 2013-06-10 MED ORDER — ENOXAPARIN SODIUM 40 MG/0.4ML ~~LOC~~ SOLN
40.0000 mg | SUBCUTANEOUS | Status: DC
  Filled 2013-06-10 (×3): qty 0.4

## 2013-06-10 MED ORDER — IPRATROPIUM BROMIDE 0.02 % IN SOLN
0.5000 mg | Freq: Four times a day (QID) | RESPIRATORY_TRACT | Status: DC
  Administered 2013-06-10 – 2013-06-12 (×10): 0.5 mg via RESPIRATORY_TRACT
  Filled 2013-06-10 (×10): qty 2.5

## 2013-06-10 MED ORDER — KETOROLAC TROMETHAMINE 30 MG/ML IJ SOLN
30.0000 mg | Freq: Once | INTRAMUSCULAR | Status: AC
  Administered 2013-06-11: 30 mg via INTRAVENOUS
  Filled 2013-06-10: qty 1

## 2013-06-10 MED ORDER — SODIUM CHLORIDE 0.9 % IJ SOLN
3.0000 mL | Freq: Two times a day (BID) | INTRAMUSCULAR | Status: DC
  Administered 2013-06-10: 3 mL via INTRAVENOUS

## 2013-06-10 MED ORDER — METHYLPREDNISOLONE SODIUM SUCC 125 MG IJ SOLR
80.0000 mg | Freq: Two times a day (BID) | INTRAMUSCULAR | Status: DC
  Administered 2013-06-10 – 2013-06-11 (×3): 80 mg via INTRAVENOUS
  Filled 2013-06-10 (×5): qty 1.28

## 2013-06-10 MED ORDER — ASPIRIN EC 81 MG PO TBEC
81.0000 mg | DELAYED_RELEASE_TABLET | Freq: Every day | ORAL | Status: DC
  Administered 2013-06-10 – 2013-06-11 (×2): 81 mg via ORAL
  Filled 2013-06-10 (×3): qty 1

## 2013-06-10 MED ORDER — ALBUTEROL SULFATE (5 MG/ML) 0.5% IN NEBU: 2.5000 mg | INHALATION_SOLUTION | RESPIRATORY_TRACT | Status: DC | PRN

## 2013-06-10 MED ORDER — AZITHROMYCIN 250 MG PO TABS
250.0000 mg | ORAL_TABLET | Freq: Every day | ORAL | Status: DC
  Administered 2013-06-11: 250 mg via ORAL
  Filled 2013-06-10 (×2): qty 1

## 2013-06-10 MED ORDER — SODIUM CHLORIDE 0.9 % IJ SOLN: 3.0000 mL | INTRAMUSCULAR | Status: DC | PRN

## 2013-06-10 MED ORDER — HYDROCODONE-ACETAMINOPHEN 5-325 MG PO TABS
1.0000 | ORAL_TABLET | ORAL | Status: DC | PRN
  Administered 2013-06-10 (×3): 2 via ORAL
  Filled 2013-06-10 (×2): qty 2

## 2013-06-10 MED ORDER — DIPHENHYDRAMINE HCL 50 MG/ML IJ SOLN
25.0000 mg | Freq: Once | INTRAMUSCULAR | Status: AC
  Administered 2013-06-10: 25 mg via INTRAVENOUS
  Filled 2013-06-10: qty 1

## 2013-06-10 MED ORDER — AZITHROMYCIN 500 MG PO TABS
500.0000 mg | ORAL_TABLET | Freq: Every day | ORAL | Status: AC
  Administered 2013-06-10: 500 mg via ORAL
  Filled 2013-06-10: qty 1

## 2013-06-10 MED ORDER — SODIUM CHLORIDE 0.9 % IJ SOLN
3.0000 mL | Freq: Two times a day (BID) | INTRAMUSCULAR | Status: DC
  Administered 2013-06-10 – 2013-06-12 (×4): 3 mL via INTRAVENOUS

## 2013-06-10 MED ORDER — METOCLOPRAMIDE HCL 5 MG/ML IJ SOLN
10.0000 mg | Freq: Once | INTRAMUSCULAR | Status: AC
  Administered 2013-06-11: 10 mg via INTRAVENOUS
  Filled 2013-06-10: qty 2

## 2013-06-10 MED ORDER — SODIUM CHLORIDE 0.9 % IV SOLN: 250.0000 mL | INTRAVENOUS | Status: DC | PRN

## 2013-06-10 MED ORDER — ALBUTEROL SULFATE (5 MG/ML) 0.5% IN NEBU
2.5000 mg | INHALATION_SOLUTION | Freq: Four times a day (QID) | RESPIRATORY_TRACT | Status: DC
  Administered 2013-06-10 – 2013-06-12 (×10): 2.5 mg via RESPIRATORY_TRACT
  Filled 2013-06-10 (×10): qty 0.5

## 2013-06-10 NOTE — Progress Notes (Signed)
Utilization review completed.  

## 2013-06-10 NOTE — Progress Notes (Signed)
[facility] PROGRESS NOTE  [name] ZOX:[id number] DOB: 1976/09/02 DOA: 06/09/2013 PCP: No PCP Per Patient  Assessment/Plan: SOB: - Suspected asthma exacerbation - Currently continued on steroids and scheduled nebs - Currently on minimal O2 support - Consider weaning steroids as tolerated - Ddimer neg, which rules out PE. A f/u CTA was done, again, neg for PE - On empiric azithro  Chest Pain: - Suspect chest wall pain in setting of sob and tachypnea - Thus far, cardiac enzymes neg x 2 - Cont tele and cont to monitor  DVT prophylaxis: - Cont lovenox for now  Code Status: Full Family Communication: Pt in room (indicate person spoken with, relationship, and if by phone, the number) Disposition Plan: Pending   Antibiotics:  Azithromycin 8/12 >>>  HPI/Subjective: Reports improvement with breathing treatment. No acute events noted overnight  Objective: Filed Vitals:   06/09/13 2345 06/10/13 0000 06/10/13 0248 06/10/13 0500  BP: 115/39 109/45  106/77  Pulse: 95 85  74  Temp:    98 F (36.7 C)  TempSrc:    Oral  Resp:      SpO2: 99% 100% 99% 99%    Intake/Output Summary (Last 24 hours) at 06/10/13 0845 Last data filed at 06/10/13 8119  Gross per 24 hour  Intake    354 ml  Output      0 ml  Net    354 ml   There were no vitals filed for this visit.  Exam:   General:  Awake, in nad  Cardiovascular: regular, s1, s2  Respiratory: normal resp effort, no wheezing  Abdomen: soft, nondistended  Musculoskeletal: perfused, no clubbing   Data Reviewed: Basic Metabolic Panel:  Recent Labs Lab 06/09/13 1638 06/10/13 0230  NA 137 136  K 3.9 4.1  CL 104 105  CO2 20 19  GLUCOSE 95 174*  BUN 11 8  CREATININE 0.79 0.61  CALCIUM 9.1 8.5   Liver Function Tests: No results found for this basename: AST, ALT, ALKPHOS, BILITOT, PROT, ALBUMIN,  in the last 168 hours No results found for this basename: LIPASE, AMYLASE,  in the last 168 hours No results  found for this basename: AMMONIA,  in the last 168 hours CBC:  Recent Labs Lab 06/09/13 1638 06/10/13 0230  WBC 8.4 8.6  HGB 10.8* 9.9*  HCT 33.8* 30.8*  MCV 73.6* 73.7*  PLT 177 185   Cardiac Enzymes:  Recent Labs Lab 06/10/13 0230  TROPONINI <0.30   BNP (last 3 results) No results found for this basename: PROBNP,  in the last 8760 hours CBG: No results found for this basename: GLUCAP,  in the last 168 hours  No results found for this or any previous visit (from the past 240 hour(s)).   Studies: Dg Chest 2 View  06/09/2013   *RADIOLOGY REPORT*  Clinical Data: Chest pain, asthma, shortness of breath  CHEST - 2 VIEW  Comparison: 01/01/2012  Findings: Very low lung volumes accentuating the vascular markings with basilar atelectasis.  Normal heart size and vascularity. Negative for pneumonia, collapse or consolidation.  No effusion or pneumothorax.  Trachea midline.  IMPRESSION: Low volume exam with vascular congestion and basilar atelectasis   Original Report Authenticated By: Judie Petit. Miles Costain, M.D.   Ct Angio Chest Pe W/cm &/or Wo Cm  06/09/2013   *RADIOLOGY REPORT*  Clinical Data: Shortness of breath and chest pain.  CT ANGIOGRAPHY CHEST  Technique:  Multidetector CT imaging of the chest using the standard protocol during bolus administration of intravenous  contrast. Multiplanar reconstructed images including MIPs were obtained and reviewed to evaluate the vascular anatomy.  Contrast: OMNIPAQUE IOHEXOL 350 MG/ML SOLN  Comparison: No priors.  Findings:  Mediastinum: There are no filling defects within the pulmonary arterial tree to suggest underlying pulmonary embolism. Heart size is mildly enlarged. There is no significant pericardial fluid, thickening or pericardial calcification. No pathologically enlarged mediastinal or hilar lymph nodes. Esophagus is unremarkable in appearance.  Lungs/Pleura: Minimal dependent atelectasis throughout the lung bases bilaterally.  No consolidative  airspace disease.  No pleural effusions.  No suspicious appearing pulmonary nodules or masses.  Upper Abdomen: Status post cholecystectomy.  Musculoskeletal: There are no aggressive appearing lytic or blastic lesions noted in the visualized portions of the skeleton.  IMPRESSION: 1.  No evidence of pulmonary embolism. 2.  No acute findings in the thorax to account for the patient's symptoms. 3.  Status post cholecystectomy. 4.  Mild cardiomegaly.   Original Report Authenticated By: Trudie Reed, M.D.    Scheduled Meds: . albuterol  2.5 mg Nebulization Q6H  . aspirin EC  81 mg Oral Daily  . [START ON 06/11/2013] azithromycin  250 mg Oral Daily  . enoxaparin (LOVENOX) injection  40 mg Subcutaneous Q24H  . ipratropium  0.5 mg Nebulization Q6H  . methylPREDNISolone (SOLU-MEDROL) injection  80 mg Intravenous Q12H  . sodium chloride  3 mL Intravenous Q12H  . sodium chloride  3 mL Intravenous Q12H   Continuous Infusions:   Principal Problem:   Acute respiratory failure Active Problems:   Asthma with acute exacerbation   Anemia   SOB (shortness of breath)   Chest pain, pleuritic    Time spent:    Darshawn Boateng K  [facility] Pager [phone]. If 7PM-7AM, please contact night-coverage at [email or web address], password [facility] 06/10/2013, 8:45 AM  LOS: 1 day

## 2013-06-10 NOTE — Progress Notes (Signed)
  Echocardiogram 2D Echocardiogram has been performed.  Cathie Beams 06/10/2013, 10:51 AM

## 2013-06-11 ENCOUNTER — Inpatient Hospital Stay (HOSPITAL_COMMUNITY): Payer: Medicaid Other

## 2013-06-11 MED ORDER — METOCLOPRAMIDE HCL 5 MG/ML IJ SOLN
10.0000 mg | Freq: Once | INTRAMUSCULAR | Status: AC
  Administered 2013-06-12: 10 mg via INTRAVENOUS
  Filled 2013-06-11: qty 2

## 2013-06-11 MED ORDER — ACETAMINOPHEN 325 MG PO TABS
650.0000 mg | ORAL_TABLET | Freq: Four times a day (QID) | ORAL | Status: DC | PRN
  Administered 2013-06-11: 650 mg via ORAL
  Filled 2013-06-11: qty 2

## 2013-06-11 MED ORDER — PREDNISONE 20 MG PO TABS
40.0000 mg | ORAL_TABLET | Freq: Every day | ORAL | Status: DC
Start: 2013-06-12 — End: 2013-06-12
  Administered 2013-06-12: 40 mg via ORAL
  Filled 2013-06-11 (×2): qty 2

## 2013-06-11 MED ORDER — DIPHENHYDRAMINE HCL 50 MG/ML IJ SOLN
25.0000 mg | Freq: Once | INTRAMUSCULAR | Status: AC
  Administered 2013-06-12: 25 mg via INTRAVENOUS
  Filled 2013-06-11: qty 1

## 2013-06-11 MED ORDER — KETOROLAC TROMETHAMINE 30 MG/ML IJ SOLN
30.0000 mg | Freq: Once | INTRAMUSCULAR | Status: AC
  Administered 2013-06-12: 30 mg via INTRAVENOUS
  Filled 2013-06-11: qty 1

## 2013-06-11 NOTE — Progress Notes (Signed)
PATIENT DETAILS Name: [name] Age: 37 y.o. Sex: female Date of Birth: 1976/09/26 Admit Date: 06/09/2013 Admitting Physician Tarry Kos, MD PCP:No PCP Per Patient  Subjective: Feels weak today, but SOB better  Assessment/Plan: Principal Problem:   Acute respiratory failure -2/2 acute asthma exac -better and almost resolved  Active Problems:   Asthma with acute exacerbation -change from solumedrol to prednisone -c/w nebs -CTA chest is neg for PE   Chest Pain -doubt cardiac etiology -denied chest pain today -Troponin neg -Echo does not show wall motion abnormality -doubt further work up is required while she is inpatient    Anemia -microcytic -suspect menstural loss -further work up as outpatient  Disposition: Remain inpatient-home 8/15  DVT Prophylaxis: Prophylactic Lovenox   Code Status: Full code   Family Communication None at bedside  Procedures:  None  CONSULTS:  None   MEDICATIONS: Scheduled Meds: . albuterol  2.5 mg Nebulization Q6H  . aspirin EC  81 mg Oral Daily  . azithromycin  250 mg Oral Daily  . enoxaparin (LOVENOX) injection  40 mg Subcutaneous Q24H  . ipratropium  0.5 mg Nebulization Q6H  . [START ON 06/12/2013] predniSONE  40 mg Oral Q breakfast  . sodium chloride  3 mL Intravenous Q12H  . sodium chloride  3 mL Intravenous Q12H   Continuous Infusions:  PRN Meds:.sodium chloride, albuterol, HYDROcodone-acetaminophen, sodium chloride  Antibiotics: Anti-infectives   Start     Dose/Rate Route Frequency Ordered Stop   06/11/13 1000  azithromycin (ZITHROMAX) tablet 250 mg     250 mg Oral Daily 06/10/13 0159 06/15/13 0959   06/10/13 0215  azithromycin (ZITHROMAX) tablet 500 mg     500 mg Oral Daily 06/10/13 0159 06/10/13 0235       PHYSICAL EXAM: Vital signs in last 24 hours: Filed Vitals:   06/11/13 0538 06/11/13 0737 06/11/13 1319 06/11/13 1336  BP: 117/72  119/63   Pulse: 72  107   Temp: 98.4 F (36.9 C)  98.7 F  (37.1 C)   TempSrc: Oral  Oral   Resp: 20  21   Height: 5' 9.5" (1.765 m)     Weight: 110.814 kg (244 lb 4.8 oz)     SpO2: 99% 99% 97% 99%    Weight change:  Filed Weights   06/11/13 0538  Weight: 110.814 kg (244 lb 4.8 oz)   Body mass index is 35.57 kg/(m^2).   Gen Exam: Awake and alert with clear speech.   Neck: Supple, No JVD.   Chest: B/L Clear.  Still with some rhonchi CVS: S1 S2 Regular, no murmurs.  Abdomen: soft, BS +, non tender, non distended.  Extremities: no edema, lower extremities warm to touch. Neurologic: Non Focal.   Skin: No Rash.   Wounds: N/A.    Intake/Output from previous day:  Intake/Output Summary (Last 24 hours) at 06/11/13 1526 Last data filed at 06/11/13 1300  Gross per 24 hour  Intake    480 ml  Output      0 ml  Net    480 ml     LAB RESULTS: CBC  Recent Labs Lab 06/09/13 1638 06/10/13 0230  WBC 8.4 8.6  HGB 10.8* 9.9*  HCT 33.8* 30.8*  PLT 177 185  MCV 73.6* 73.7*  MCH 23.5* 23.7*  MCHC 32.0 32.1  RDW 13.2 13.2    Chemistries   Recent Labs Lab 06/09/13 1638 06/10/13 0230  NA 137 136  K 3.9 4.1  CL 104 105  CO2 20  19  GLUCOSE 95 174*  BUN 11 8  CREATININE 0.79 0.61  CALCIUM 9.1 8.5    CBG: No results found for this basename: GLUCAP,  in the last 168 hours  GFR Estimated Creatinine Clearance: 127.6 ml/min (by C-G formula based on Cr of 0.61).  Coagulation profile No results found for this basename: INR, PROTIME,  in the last 168 hours  Cardiac Enzymes  Recent Labs Lab 06/10/13 0230  TROPONINI <0.30    No components found with this basename: POCBNP,   Recent Labs  06/09/13 2151  DDIMER <0.27   No results found for this basename: HGBA1C,  in the last 72 hours No results found for this basename: CHOL, HDL, LDLCALC, TRIG, CHOLHDL, LDLDIRECT,  in the last 72 hours No results found for this basename: TSH, T4TOTAL, FREET3, T3FREE, THYROIDAB,  in the last 72 hours No results found for this basename:  VITAMINB12, FOLATE, FERRITIN, TIBC, IRON, RETICCTPCT,  in the last 72 hours No results found for this basename: LIPASE, AMYLASE,  in the last 72 hours  Urine Studies No results found for this basename: UACOL, UAPR, USPG, UPH, UTP, UGL, UKET, UBIL, UHGB, UNIT, UROB, ULEU, UEPI, UWBC, URBC, UBAC, CAST, CRYS, UCOM, BILUA,  in the last 72 hours  MICROBIOLOGY: No results found for this or any previous visit (from the past 240 hour(s)).  RADIOLOGY STUDIES/RESULTS: Dg Chest 2 View  06/11/2013   *RADIOLOGY REPORT*  Clinical Data: Shortness of breath and chest pain  CHEST - 2 VIEW  Comparison: Chest radiograph and chest CT June 09, 2013  Findings: There is no edema or consolidation.  Heart is upper normal in size with normal pulmonary vascularity.  No adenopathy. No bone lesions.  IMPRESSION: No edema or consolidation.   Original Report Authenticated By: Bretta Bang, M.D.   Dg Chest 2 View  06/09/2013   *RADIOLOGY REPORT*  Clinical Data: Chest pain, asthma, shortness of breath  CHEST - 2 VIEW  Comparison: 01/01/2012  Findings: Very low lung volumes accentuating the vascular markings with basilar atelectasis.  Normal heart size and vascularity. Negative for pneumonia, collapse or consolidation.  No effusion or pneumothorax.  Trachea midline.  IMPRESSION: Low volume exam with vascular congestion and basilar atelectasis   Original Report Authenticated By: Judie Petit. Miles Costain, M.D.   Ct Angio Chest Pe W/cm &/or Wo Cm  06/09/2013   *RADIOLOGY REPORT*  Clinical Data: Shortness of breath and chest pain.  CT ANGIOGRAPHY CHEST  Technique:  Multidetector CT imaging of the chest using the standard protocol during bolus administration of intravenous contrast. Multiplanar reconstructed images including MIPs were obtained and reviewed to evaluate the vascular anatomy.  Contrast: OMNIPAQUE IOHEXOL 350 MG/ML SOLN  Comparison: No priors.  Findings:  Mediastinum: There are no filling defects within the pulmonary arterial  tree to suggest underlying pulmonary embolism. Heart size is mildly enlarged. There is no significant pericardial fluid, thickening or pericardial calcification. No pathologically enlarged mediastinal or hilar lymph nodes. Esophagus is unremarkable in appearance.  Lungs/Pleura: Minimal dependent atelectasis throughout the lung bases bilaterally.  No consolidative airspace disease.  No pleural effusions.  No suspicious appearing pulmonary nodules or masses.  Upper Abdomen: Status post cholecystectomy.  Musculoskeletal: There are no aggressive appearing lytic or blastic lesions noted in the visualized portions of the skeleton.  IMPRESSION: 1.  No evidence of pulmonary embolism. 2.  No acute findings in the thorax to account for the patient's symptoms. 3.  Status post cholecystectomy. 4.  Mild cardiomegaly.   Original  Report Authenticated By: Trudie Reed, M.D.    Jeoffrey Massed, MD  [facility] Pager:[phone]  If 7PM-7AM, please contact night-coverage [email or web address] Password [email or web address] 06/11/2013, 3:26 PM   LOS: 2 days

## 2013-06-11 NOTE — ED Provider Notes (Signed)
Medical screening examination/treatment/procedure(s) were conducted as a shared visit with non-physician practitioner(s) and myself.  I personally evaluated the patient during the encounter   Junius Argyle, MD 06/11/13 1128

## 2013-06-12 LAB — BASIC METABOLIC PANEL
CO2: 25 mEq/L (ref 19–32)
Calcium: 8.3 mg/dL — ABNORMAL LOW (ref 8.4–10.5)
GFR calc non Af Amer: >90 mL/min (ref >90)
Potassium: 4 mEq/L (ref 3.5–5.1)
Sodium: 139 mEq/L (ref 135–145)

## 2013-06-12 LAB — CBC
MCH: 24 pg — ABNORMAL LOW (ref 26.0–34.0)
MCHC: 31.7 g/dL (ref 30.0–36.0)
Platelets: 167 10*3/uL (ref 150–400)
RBC: 4.09 MIL/uL (ref 3.87–5.11)

## 2013-06-12 MED ORDER — MOMETASONE FURO-FORMOTEROL FUM 100-5 MCG/ACT IN AERO
2.0000 | INHALATION_SPRAY | Freq: Two times a day (BID) | RESPIRATORY_TRACT | Status: DC
  Filled 2013-06-12: qty 8.8

## 2013-06-12 MED ORDER — ALBUTEROL SULFATE HFA 108 (90 BASE) MCG/ACT IN AERS: 2.0000 | INHALATION_SPRAY | RESPIRATORY_TRACT | Status: DC | PRN

## 2013-06-12 MED ORDER — PREDNISONE 10 MG PO TABS: ORAL_TABLET | ORAL | Status: DC

## 2013-06-12 MED ORDER — FLUTICASONE-SALMETEROL 250-50 MCG/DOSE IN AEPB: 1.0000 | INHALATION_SPRAY | Freq: Two times a day (BID) | RESPIRATORY_TRACT | Status: DC

## 2013-06-12 MED ORDER — ALBUTEROL SULFATE (2.5 MG/3ML) 0.083% IN NEBU: 2.5000 mg | INHALATION_SOLUTION | Freq: Four times a day (QID) | RESPIRATORY_TRACT | Status: DC | PRN

## 2013-06-12 NOTE — Discharge Summary (Signed)
PATIENT DETAILS Name: [name] Age: 37 y.o. Sex: female Date of Birth: 1976/08/27 MRN: [id number]. Admit Date: 06/09/2013 Admitting Physician: Tarry Kos, MD PCP:No PCP Per Patient  Recommendations for Outpatient Follow-up:  1. Anemia work up   PRIMARY DISCHARGE DIAGNOSIS:  Principal Problem:   Acute respiratory failure Active Problems:   Asthma with acute exacerbation   Anemia   SOB (shortness of breath)   Chest pain, pleuritic      PAST MEDICAL HISTORY: Past Medical History  Diagnosis Date  . Asthma   . Anemia   . Obesity   . Abdominal pain   . Vomiting   . Diarrhea   . Shortness of breath     DISCHARGE MEDICATIONS:   Medication List    STOP taking these medications       aspirin-acetaminophen-caffeine 250-250-65 MG per tablet  Commonly known as:  EXCEDRIN MIGRAINE      TAKE these medications       acetaminophen 500 MG tablet  Commonly known as:  TYLENOL  Take 1,000 mg by mouth every 6 (six) hours as needed. pain     albuterol 108 (90 BASE) MCG/ACT inhaler  Commonly known as:  PROVENTIL HFA;VENTOLIN HFA  Inhale 2 puffs into the lungs every 4 (four) hours as needed for wheezing. wheezing     albuterol (2.5 MG/3ML) 0.083% nebulizer solution  Commonly known as:  PROVENTIL  Take 3 mL (2.5 mg total) by nebulization every 6 (six) hours as needed for wheezing or shortness of breath. wheezing     Fluticasone-Salmeterol 250-50 MCG/DOSE Aepb  Commonly known as:  ADVAIR  Inhale 1 puff into the lungs every 12 (twelve) hours.     predniSONE 10 MG tablet  Commonly known as:  DELTASONE  - Take 4 tablets daily for 2 days, then,   - Take 3 tablets daily for 2 days, then,  - Take 2 tablets daily for 2 days, then,  - Take 1 tablet daily for 1 day and then stop        ALLERGIES:  No Known Allergies  BRIEF HPI:  See H&P, Labs, Consult and Test reports for all details in brief,37 yo female with h/o asthma was admitted with worsening  SOB.  CONSULTATIONS:   None  PERTINENT RADIOLOGIC STUDIES: Dg Chest 2 View  06/11/2013   *RADIOLOGY REPORT*  Clinical Data: Shortness of breath and chest pain  CHEST - 2 VIEW  Comparison: Chest radiograph and chest CT June 09, 2013  Findings: There is no edema or consolidation.  Heart is upper normal in size with normal pulmonary vascularity.  No adenopathy. No bone lesions.  IMPRESSION: No edema or consolidation.   Original Report Authenticated By: Bretta Bang, M.D.   Dg Chest 2 View  06/09/2013   *RADIOLOGY REPORT*  Clinical Data: Chest pain, asthma, shortness of breath  CHEST - 2 VIEW  Comparison: 01/01/2012  Findings: Very low lung volumes accentuating the vascular markings with basilar atelectasis.  Normal heart size and vascularity. Negative for pneumonia, collapse or consolidation.  No effusion or pneumothorax.  Trachea midline.  IMPRESSION: Low volume exam with vascular congestion and basilar atelectasis   Original Report Authenticated By: Judie Petit. Miles Costain, M.D.   Ct Angio Chest Pe W/cm &/or Wo Cm  06/09/2013   *RADIOLOGY REPORT*  Clinical Data: Shortness of breath and chest pain.  CT ANGIOGRAPHY CHEST  Technique:  Multidetector CT imaging of the chest using the standard protocol during bolus administration of intravenous contrast. Multiplanar reconstructed images including  MIPs were obtained and reviewed to evaluate the vascular anatomy.  Contrast: OMNIPAQUE IOHEXOL 350 MG/ML SOLN  Comparison: No priors.  Findings:  Mediastinum: There are no filling defects within the pulmonary arterial tree to suggest underlying pulmonary embolism. Heart size is mildly enlarged. There is no significant pericardial fluid, thickening or pericardial calcification. No pathologically enlarged mediastinal or hilar lymph nodes. Esophagus is unremarkable in appearance.  Lungs/Pleura: Minimal dependent atelectasis throughout the lung bases bilaterally.  No consolidative airspace disease.  No pleural effusions.  No  suspicious appearing pulmonary nodules or masses.  Upper Abdomen: Status post cholecystectomy.  Musculoskeletal: There are no aggressive appearing lytic or blastic lesions noted in the visualized portions of the skeleton.  IMPRESSION: 1.  No evidence of pulmonary embolism. 2.  No acute findings in the thorax to account for the patient's symptoms. 3.  Status post cholecystectomy. 4.  Mild cardiomegaly.   Original Report Authenticated By: Trudie Reed, M.D.     PERTINENT LAB RESULTS: CBC:  Recent Labs  06/10/13 0230 06/12/13 0617  WBC 8.6 9.1  HGB 9.9* 9.8*  HCT 30.8* 30.9*  PLT 185 167   CMET CMP     Component Value Date/Time   NA 139 06/12/2013 0617   K 4.0 06/12/2013 0617   CL 105 06/12/2013 0617   CO2 25 06/12/2013 0617   GLUCOSE 86 06/12/2013 0617   BUN 14 06/12/2013 0617   CREATININE 0.79 06/12/2013 0617   CALCIUM 8.3* 06/12/2013 0617   PROT 7.2 06/21/2011 0659   ALBUMIN 3.5 06/21/2011 0659   AST 55* 06/21/2011 0659   ALT 127* 06/21/2011 0659   ALKPHOS 99 06/21/2011 0659   BILITOT 0.3 06/21/2011 0659   GFRNONAA >90 06/12/2013 0617   GFRAA >90 06/12/2013 0617    GFR Estimated Creatinine Clearance: 127.6 ml/min (by C-G formula based on Cr of 0.79). No results found for this basename: LIPASE, AMYLASE,  in the last 72 hours  Recent Labs  06/10/13 0230  TROPONINI <0.30   No components found with this basename: POCBNP,   Recent Labs  06/09/13 2151  DDIMER <0.27   No results found for this basename: HGBA1C,  in the last 72 hours No results found for this basename: CHOL, HDL, LDLCALC, TRIG, CHOLHDL, LDLDIRECT,  in the last 72 hours No results found for this basename: TSH, T4TOTAL, FREET3, T3FREE, THYROIDAB,  in the last 72 hours No results found for this basename: VITAMINB12, FOLATE, FERRITIN, TIBC, IRON, RETICCTPCT,  in the last 72 hours Coags: No results found for this basename: PT, INR,  in the last 72 hours Microbiology: No results found for this or any previous visit  (from the past 240 hour(s)).   BRIEF HOSPITAL COURSE:  Acute respiratory failure  -2/2 acute asthma exac  -resolved   Asthma with acute exacerbation  -admitted and started on IV solumedrol, zithromax and nebulized bronchodilators, with these measures-patient quickly made clinical improvement. She is now ambulating in the room without any O2, her lungs are clear to auscultation, she is stable for discharge today.  -will dishcarge on tapering prednisone, advair and albuterol inhaler prn -CTA chest is neg for PE   Chest Pain  -doubt cardiac etiology  -denied chest pain today  -Troponin neg  -Echo does not show wall motion abnormality  -doubt further work up is required while she is inpatient   Anemia  -microcytic  -suspect menstural loss  -further work up as outpatient, when she follows at the [facility] center   TODAY-DAY  OF DISCHARGE:  Subjective:   Tirsa Gail today has no headache,no chest abdominal pain,no new weakness tingling or numbness, feels much better wants to go home today.   Objective:   Blood pressure 120/76, pulse 69, temperature 98.5 F (36.9 C), temperature source Oral, resp. rate 18, height 5' 9.5" (1.765 m), weight 110.814 kg (244 lb 4.8 oz), last menstrual period 05/11/2013, SpO2 98.00%.  Intake/Output Summary (Last 24 hours) at 06/12/13 0941 Last data filed at 06/11/13 1300  Gross per 24 hour  Intake    600 ml  Output      0 ml  Net    600 ml   Filed Weights   06/11/13 0538  Weight: 110.814 kg (244 lb 4.8 oz)    Exam Awake Alert, Oriented *3, No new F.N deficits, Normal affect [place].AT,PERRAL Supple Neck,No JVD, No cervical lymphadenopathy appriciated.  Symmetrical Chest wall movement, Good air movement bilaterally, CTAB RRR,No Gallops,Rubs or new Murmurs, No Parasternal Heave +ve B.Sounds, Abd Soft, Non tender, No organomegaly appriciated, No rebound -guarding or rigidity. No Cyanosis, Clubbing or edema, No new Rash or bruise  DISCHARGE  CONDITION: Stable  DISPOSITION: Home   DISCHARGE INSTRUCTIONS:    Activity:  As tolerated   Diet recommendation: Regular Diet       Discharge Orders   Future Orders Complete By Expires   Call MD for:  difficulty breathing, headache or visual disturbances  As directed    Diet general  As directed    Increase activity slowly  As directed       Follow-up Information   Follow up with [facility]    . (call [phone] for an appointment)    Contact information:   [address] [place] [address]      Total Time spent on discharge equals 45 minutes.  SignedJeoffrey Massed 06/12/2013 9:41 AM

## 2013-06-23 ENCOUNTER — Ambulatory Visit: Payer: Self-pay | Attending: Internal Medicine | Admitting: Internal Medicine

## 2013-06-23 VITALS — BP 109/70 | HR 86 | Temp 98.4°F | Resp 16 | Wt 239.6 lb

## 2013-06-23 DIAGNOSIS — R0602 Shortness of breath: Secondary | ICD-10-CM | POA: Insufficient documentation

## 2013-06-23 DIAGNOSIS — J45901 Unspecified asthma with (acute) exacerbation: Secondary | ICD-10-CM

## 2013-06-23 MED ORDER — FLUTICASONE-SALMETEROL 250-50 MCG/DOSE IN AEPB: 1.0000 | INHALATION_SPRAY | Freq: Two times a day (BID) | RESPIRATORY_TRACT | Status: DC

## 2013-06-23 MED ORDER — ALBUTEROL SULFATE HFA 108 (90 BASE) MCG/ACT IN AERS: 2.0000 | INHALATION_SPRAY | RESPIRATORY_TRACT | Status: DC | PRN

## 2013-06-23 NOTE — Progress Notes (Signed)
Patient here for follow up from hospital visit Was in [facility] for her asthma-today presents with SOB

## 2013-06-23 NOTE — Progress Notes (Signed)
Patient ID: [name], female   DOB: 1976/07/01, 37 y.o.   MRN: [id number]  CC:  HPI: 37 year old female who presents with shortness of breath. Recently hospitalized for acute asthma exacerbation, treated with Solu-Medrol, Zithromax, nebulizer treatments. The patient complains of significant dyspnea on exertion. She states that ever since her discharge the patient has had significant difficulty with ambulation because of shortness of breath which has been unchanged for the last couple of weeks. She occasionally experiences palpitations and lightheadedness She also complains of substernal chest pain. She is 100% on room air.  No Known Allergies Past Medical History  Diagnosis Date  . Asthma   . Anemia   . Obesity   . Abdominal pain   . Vomiting   . Diarrhea   . Shortness of breath    Current Outpatient Prescriptions on File Prior to Visit  Medication Sig Dispense Refill  . acetaminophen (TYLENOL) 500 MG tablet Take 1,000 mg by mouth every 6 (six) hours as needed. pain      . albuterol (PROVENTIL) (2.5 MG/3ML) 0.083% nebulizer solution Take 3 mL (2.5 mg total) by nebulization every 6 (six) hours as needed for wheezing or shortness of breath. wheezing  75 mL  0  . predniSONE (DELTASONE) 10 MG tablet Take 4 tablets daily for 2 days, then,  Take 3 tablets daily for 2 days, then, Take 2 tablets daily for 2 days, then, Take 1 tablet daily for 1 day and then stop  19 tablet  0   No current facility-administered medications on file prior to visit.   Family History  Problem Relation Age of Onset  . Diabetes Mother 41  . Cancer Father 15    lung cancer   History   Social History  . Marital Status: Married    Spouse Name: N/A    Number of Children: N/A  . Years of Education: N/A   Occupational History  . Not on file.   Social History Main Topics  . Smoking status: Never Smoker   . Smokeless tobacco: Never Used  . Alcohol Use: No  . Drug Use: No  . Sexual Activity: Not on  file   Other Topics Concern  . Not on file   Social History Narrative  . No narrative on file    Review of Systems  Constitutional: Negative for fever, chills, diaphoresis, activity change, appetite change and fatigue.  HENT: Negative for ear pain, nosebleeds, congestion, facial swelling, rhinorrhea, neck pain, neck stiffness and ear discharge.   Eyes: Negative for pain, discharge, redness, itching and visual disturbance.  Respiratory: Negative for cough, choking, chest tightness, shortness of breath, wheezing and stridor.   Cardiovascular: Negative for chest pain, palpitations and leg swelling.  Gastrointestinal: Negative for abdominal distention.  Genitourinary: Negative for dysuria, urgency, frequency, hematuria, flank pain, decreased urine volume, difficulty urinating and dyspareunia.  Musculoskeletal: Negative for back pain, joint swelling, arthralgias and gait problem.  Neurological: Negative for dizziness, tremors, seizures, syncope, facial asymmetry, speech difficulty, weakness, light-headedness, numbness and headaches.  Hematological: Negative for adenopathy. Does not bruise/bleed easily.  Psychiatric/Behavioral: Negative for hallucinations, behavioral problems, confusion, dysphoric mood, decreased concentration and agitation.    Objective:   Filed Vitals:   06/23/13 1208  BP: 109/70  Pulse: 86  Temp: 98.4 F (36.9 C)  Resp: 16    Physical Exam  Constitutional: Appears well-developed and well-nourished. No distress.  HENT: Normocephalic. External right and left ear normal. Oropharynx is clear and moist.  Eyes: Conjunctivae and EOM  are normal. PERRLA, no scleral icterus.  Neck: Normal ROM. Neck supple. No JVD. No tracheal deviation. No thyromegaly.  CVS: RRR, S1/S2 +, no murmurs, no gallops, no carotid bruit.  Pulmonary: Effort and breath sounds normal, no stridor, rhonchi, wheezes, rales.  Abdominal: Soft. BS +,  no distension, tenderness, rebound or guarding.   Musculoskeletal: Normal range of motion. No edema and no tenderness.  Lymphadenopathy: No lymphadenopathy noted, cervical, inguinal. Neuro: Alert. Normal reflexes, muscle tone coordination. No cranial nerve deficit. Skin: Skin is warm and dry. No rash noted. Not diaphoretic. No erythema. No pallor.  Psychiatric: Normal mood and affect. Behavior, judgment, thought content normal.   Lab Results  Component Value Date   WBC 9.1 06/12/2013   HGB 9.8* 06/12/2013   HCT 30.9* 06/12/2013   MCV 75.6* 06/12/2013   PLT 167 06/12/2013   Lab Results  Component Value Date   CREATININE 0.79 06/12/2013   BUN 14 06/12/2013   NA 139 06/12/2013   K 4.0 06/12/2013   CL 105 06/12/2013   CO2 25 06/12/2013    Lab Results  Component Value Date   HGBA1C 5.3 01/01/2012   Lipid Panel     Component Value Date/Time   CHOL 150 01/23/2008 1936   TRIG 54 01/23/2008 1936   HDL 69 01/23/2008 1936   CHOLHDL 2.2 Ratio 01/23/2008 1936   VLDL 11 01/23/2008 1936   LDLCALC 70 01/23/2008 1936       Assessment and plan:   Patient Active Problem List   Diagnosis Date Noted  . Acute respiratory failure 06/09/2013  . SOB (shortness of breath) 06/09/2013  . Chest pain, pleuritic 06/09/2013  . Asthma with acute exacerbation 01/01/2012  . Anemia 01/01/2012  . Hypokalemia 01/01/2012  . Gallstone pancreatitis 08/02/2011   Shortness of breath Likely secondary to moderate persistent asthma Patient is also complaining of chest pain Strong family history of DVT, her mother is on Coumadin Patient needs to be evaluated in the ED at least with a d-dimer and a troponin I advised her to the ER as soon as possible Explained to her the risk of DVT/PE given recent hospitalization Patient voices understanding  After evaluation in the ED the patient can follow up with [place] in 2 weeks      The patient was given clear instructions to go to ER or return to medical center if symptoms don't improve, worsen or new problems develop. The  patient verbalized understanding. The patient was told to call to get any lab results if not heard anything in the next week.

## 2013-07-22 ENCOUNTER — Institutional Professional Consult (permissible substitution): Payer: Self-pay | Admitting: Internal Medicine

## 2013-07-23 ENCOUNTER — Institutional Professional Consult (permissible substitution): Payer: Self-pay | Admitting: Internal Medicine

## 2013-07-30 ENCOUNTER — Ambulatory Visit (INDEPENDENT_AMBULATORY_CARE_PROVIDER_SITE_OTHER): Payer: Self-pay | Admitting: Internal Medicine

## 2013-07-30 ENCOUNTER — Encounter: Payer: Self-pay | Admitting: Internal Medicine

## 2013-07-30 VITALS — BP 110/70 | HR 70 | Temp 97.0°F | Ht 69.0 in | Wt 244.6 lb

## 2013-07-30 DIAGNOSIS — J45909 Unspecified asthma, uncomplicated: Secondary | ICD-10-CM

## 2013-07-30 MED ORDER — MOMETASONE FURO-FORMOTEROL FUM 100-5 MCG/ACT IN AERO: INHALATION_SPRAY | RESPIRATORY_TRACT | Status: DC

## 2013-07-30 NOTE — Patient Instructions (Addendum)
Dulera 100 Take 2 puffs first thing in am and then another 2 puffs about 12 hours later.    Only use your albuterol (proair) as a rescue medication to be used if you can't catch your breath by resting or doing a relaxed purse lip breathing pattern.  - The less you use it, the better it will work when you need it. - Ok to use up to every 4 hours if you must but call for immediate appointment if use goes up over your usual need - Don't leave home without it !!  (think of it like your spare tire for your car)  - only use nebulizer if your try the proair and it doesn't work  GERD (REFLUX)  is an extremely common cause of respiratory symptoms, many times with no significant heartburn at all.    It can be treated with medication, but also with lifestyle changes including avoidance of late meals, excessive alcohol, smoking cessation, and avoid fatty foods, chocolate, peppermint, colas, red wine, and acidic juices such as orange juice.  NO MINT OR MENTHOL PRODUCTS SO NO COUGH DROPS  USE SUGARLESS CANDY INSTEAD (jolley ranchers or Stover's)  NO OIL BASED VITAMINS - use powdered substitutes.    Please schedule a follow up office visit in 4 weeks, sooner if needed

## 2013-07-30 NOTE — Progress Notes (Signed)
Subjective:    Patient ID: [name], female    DOB: [birth year], 37 y.o.   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  Admit Date: 06/09/2013  Admitting Physician: Tarry Kos, MD  PCP:No PCP Per Patient  Recommendations for Outpatient Follow-up:  1. Anemia work up  PRIMARY DISCHARGE DIAGNOSIS:  Principal Problem:  Acute respiratory failure  Active Problems:  Asthma with acute exacerbation  Anemia  SOB (shortness of breath)  Chest pain, pleuritic  PAST MEDICAL HISTORY:  Past Medical History   Diagnosis  Date   .  Asthma    .  Anemia    .  Obesity    .  Abdominal pain    .  Vomiting    .  Diarrhea    .  Shortness of breath    DISCHARGE MEDICATIONS:    Medication List     STOP taking these medications       aspirin-acetaminophen-caffeine 250-250-65 MG per tablet    Commonly known as: EXCEDRIN MIGRAINE     TAKE these medications       acetaminophen 500 MG tablet    Commonly known as: TYLENOL    Take 1,000 mg by mouth every 6 (six) hours as needed. pain    albuterol 108 (90 BASE) MCG/ACT inhaler    Commonly known as: PROVENTIL HFA;VENTOLIN HFA    Inhale 2 puffs into the lungs every 4 (four) hours as needed for wheezing. wheezing    albuterol (2.5 MG/3ML) 0.083% nebulizer solution    Commonly known as: PROVENTIL    Take 3 mL (2.5 mg total) by nebulization every 6 (six) hours as needed for wheezing or shortness of breath. wheezing    Fluticasone-Salmeterol 250-50 MCG/DOSE Aepb    Commonly known as: ADVAIR    Inhale 1 puff into the lungs every 12 (twelve) hours.    predniSONE 10 MG tablet    Commonly known as: DELTASONE    - Take 4 tablets daily for 2 days, then,  - Take 3 tablets daily for 2 days, then,  - Take 2 tablets daily for 2 days, then,  - Take 1 tablet daily for 1 day and then stop      ALLERGIES:  No Known Allergies  BRIEF HPI:  See H&P, Labs, Consult and Test reports for all details in brief,37 yo female with h/o asthma was admitted with worsening SOB.  CONSULTATIONS:  None  PERTINENT RADIOLOGIC STUDIES:  Dg Chest 2 View  06/11/2013 *RADIOLOGY REPORT* Clinical Data: Shortness of breath and chest pain CHEST - 2 VIEW Comparison: Chest radiograph and chest CT June 09, 2013 Findings: There is no edema or consolidation. Heart is upper normal in size with normal pulmonary vascularity. No adenopathy. No bone lesions. IMPRESSION: No edema or consolidation. Original Report Authenticated By: Bretta Bang, M.D.  Dg Chest 2 View  06/09/2013 *RADIOLOGY REPORT* Clinical Data: Chest pain, asthma, shortness of breath CHEST - 2 VIEW Comparison: 01/01/2012 Findings: Very low lung volumes accentuating the vascular markings with basilar atelectasis. Normal heart size and vascularity. Negative for pneumonia, collapse or consolidation. No effusion or pneumothorax. Trachea midline. IMPRESSION: Low volume exam with vascular congestion and basilar atelectasis Original Report Authenticated By: Judie Petit. Miles Costain, M.D.  Ct Angio Chest Pe W/cm &/or Wo Cm  06/09/2013 *RADIOLOGY REPORT* Clinical Data: Shortness of  breath and chest pain. CT ANGIOGRAPHY CHEST Technique: Multidetector CT imaging of the chest using the standard protocol during bolus administration of intravenous contrast. Multiplanar reconstructed images including MIPs were obtained and reviewed to evaluate the vascular anatomy. Contrast: OMNIPAQUE IOHEXOL 350 MG/ML SOLN Comparison: No priors. Findings: Mediastinum: There are no filling defects within the pulmonary arterial tree to suggest underlying pulmonary embolism. Heart size is mildly enlarged. There is no significant pericardial fluid, thickening or pericardial calcification. No pathologically enlarged mediastinal or hilar lymph nodes. Esophagus is unremarkable in appearance. Lungs/Pleura:  Minimal dependent atelectasis throughout the lung bases bilaterally. No consolidative airspace disease. No pleural effusions. No suspicious appearing pulmonary nodules or masses. Upper Abdomen: Status post cholecystectomy. Musculoskeletal: There are no aggressive appearing lytic or blastic lesions noted in the visualized portions of the skeleton. IMPRESSION: 1. No evidence of pulmonary embolism. 2. No acute findings in the thorax to account for the patient's symptoms. 3. Status post cholecystectomy. 4. Mild cardiomegaly. Original Report Authenticated By: Trudie Reed, M.D.  PERTINENT LAB RESULTS:  CBC:  Recent Labs   06/10/13 0230  06/12/13 0617   WBC  8.6  9.1   HGB  9.9*  9.8*   HCT  30.8*  30.9*   PLT  185  167   CMET  CMP    Component  Value  Date/Time    NA  139  06/12/2013 0617    K  4.0  06/12/2013 0617    CL  105  06/12/2013 0617    CO2  25  06/12/2013 0617    GLUCOSE  86  06/12/2013 0617    BUN  14  06/12/2013 0617    CREATININE  0.79  06/12/2013 0617    CALCIUM  8.3*  06/12/2013 0617    PROT  7.2  06/21/2011 0659    ALBUMIN  3.5  06/21/2011 0659    AST  55*  06/21/2011 0659    ALT  127*  06/21/2011 0659    ALKPHOS  99  06/21/2011 0659    BILITOT  0.3  06/21/2011 0659    GFRNONAA  >90  06/12/2013 0617    GFRAA  >90  06/12/2013 0617   GFR  Estimated Creatinine Clearance: 127.6 ml/min (by C-G formula based on Cr of 0.79).  No results found for this basename: LIPASE, AMYLASE, in the last 72 hours  Recent Labs   06/10/13 0230   TROPONINI  <0.30   No components found with this basename: POCBNP,  Recent Labs   06/09/13 2151   DDIMER  <0.27   No results found for this basename: HGBA1C, in the last 72 hours  No results found for this basename: CHOL, HDL, LDLCALC, TRIG, CHOLHDL, LDLDIRECT, in the last 72 hours  No results found for this basename: TSH, T4TOTAL, FREET3, T3FREE, THYROIDAB, in the last 72 hours  No results found for this basename: VITAMINB12, FOLATE, FERRITIN, TIBC,  IRON, RETICCTPCT, in the last 72 hours  Coags:  No results found for this basename: PT, INR, in the last 72 hours  Microbiology:  No results found for this or any previous visit (from the past 240 hour(s)).  BRIEF HOSPITAL COURSE:  Acute respiratory failure  -2/2 acute asthma exac  -resolved  Asthma with acute exacerbation  -admitted and started on IV solumedrol, zithromax and nebulized bronchodilators, with these measures-patient quickly made clinical improvement. She is now ambulating in the room without any O2, her lungs are clear to auscultation, she is stable for discharge today.  -  will dishcarge on tapering prednisone, advair and albuterol inhaler prn  -CTA chest is neg for PE  Chest Pain  -doubt cardiac etiology  -denied chest pain today  -Troponin neg  -Echo does not show wall motion abnormality  -doubt further work up is required while she is inpatient  Anemia  -microcytic  -suspect menstural loss  -further work up as outpatient, when she follows at the [facility]   07/30/2013 1st [facility] office visit/ WertChief Complaint  Patient presents with  . Pulmonary Consult    Referred per Dr. Susie Cassette. The pt states she was dxed with asthma many years ago. She c/o having increased SOB for the past month. She gets out of breath with walking distances while at work.    chest pain is more of a tightness, better with saba  No obvious day to day or daytime variabilty or assoc chronic cough or cp  overt sinus or hb symptoms. No unusual exp hx or h/o childhood pna/ asthma or knowledge of premature birth.  Sleeping ok without nocturnal  or early am exacerbation  of respiratory  c/o's or need for noct saba. Also denies any obvious fluctuation of symptoms with weather or environmental changes or other aggravating or alleviating factors except as outlined above   Current Medications, Allergies, Complete Past Medical History, Past Surgical History, Family History, and Social  History were reviewed in [employer] record.           Review of Systems  Constitutional: Negative for fever, chills and unexpected weight change.  HENT: Negative for ear pain, nosebleeds, congestion, sore throat, rhinorrhea, sneezing, trouble swallowing, dental problem, voice change, postnasal drip and sinus pressure.   Eyes: Negative for visual disturbance.  Respiratory: Positive for shortness of breath. Negative for cough and choking.   Cardiovascular: Positive for chest pain. Negative for leg swelling.  Gastrointestinal: Negative for vomiting, abdominal pain and diarrhea.  Genitourinary: Negative for difficulty urinating.  Musculoskeletal: Negative for arthralgias.  Skin: Negative for rash.  Neurological: Negative for tremors, syncope and headaches.  Hematological: Does not bruise/bleed easily.       Objective:   Physical Exam  amb bf nad Wt Readings from Last 3 Encounters:  07/30/13 244 lb 9.6 oz (110.95 kg)  06/23/13 239 lb 9.6 oz (108.682 kg)  06/11/13 244 lb 4.8 oz (110.814 kg)     HEENT: nl dentition, turbinates, and orophanx. Nl external ear canals without cough reflex   NECK :  without JVD/Nodes/TM/ nl carotid upstrokes bilaterally   LUNGS: no acc muscle use, clear to A and P bilaterally without cough on insp or exp maneuvers   CV:  RRR  no s3 or murmur or increase in P2, no edema   ABD:  soft and nontender with nl excursion in the supine position. No bruits or organomegaly, bowel sounds nl  MS:  warm without deformities, calf tenderness, cyanosis or clubbing  SKIN: warm and dry without lesions    NEURO:  alert, approp, no deficits     CTa chest 06/09/13 1. No evidence of pulmonary embolism.  2. No acute findings in the thorax to account for the patient's  symptoms.  3. Status post cholecystectomy.  4. Mild cardiomegaly.          Assessment & Plan:

## 2013-07-31 DIAGNOSIS — J45909 Unspecified asthma, uncomplicated: Secondary | ICD-10-CM | POA: Insufficient documentation

## 2013-07-31 NOTE — Assessment & Plan Note (Signed)
DDX of  difficult airways managment all start with A and  include Adherence, Ace Inhibitors, Acid Reflux, Active Sinus Disease, Alpha 1 Antitripsin deficiency, Anxiety masquerading as Airways dz,  ABPA,  allergy(esp in young), Aspiration (esp in elderly), Adverse effects of DPI,  Active smokers, plus two Bs  = Bronchiectasis and Beta blocker use..and one C= CHF  Adherence is always the initial "prime suspect" and is a multilayered concern that requires a "trust but verify" approach in every patient - starting with knowing how to use medications, especially inhalers, correctly, keeping up with refills and understanding the fundamental difference between maintenance and prns vs those medications only taken for a very short course and then stopped and not refilled. This is her greatest issue as no insurance > rx dulera 200 2 bid samples x 4 weeks then regroup  The proper method of use, as well as anticipated side effects, of a metered-dose inhaler are discussed and demonstrated to the patient. Improved effectiveness after extensive coaching during this visit to a level of approximately  75%

## 2013-08-27 ENCOUNTER — Ambulatory Visit: Payer: Self-pay | Admitting: Internal Medicine

## 2013-09-03 ENCOUNTER — Other Ambulatory Visit: Payer: Self-pay

## 2013-09-03 ENCOUNTER — Encounter: Payer: Self-pay | Admitting: Internal Medicine

## 2013-09-03 ENCOUNTER — Ambulatory Visit (INDEPENDENT_AMBULATORY_CARE_PROVIDER_SITE_OTHER): Payer: Self-pay | Admitting: Internal Medicine

## 2013-09-03 VITALS — BP 112/80 | HR 72 | Temp 98.4°F | Ht 69.0 in | Wt 239.0 lb

## 2013-09-03 DIAGNOSIS — J45909 Unspecified asthma, uncomplicated: Secondary | ICD-10-CM

## 2013-09-03 MED ORDER — PREDNISONE (PAK) 10 MG PO TABS: ORAL_TABLET | ORAL | Status: DC

## 2013-09-03 MED ORDER — PANTOPRAZOLE SODIUM 40 MG PO TBEC: 40.0000 mg | DELAYED_RELEASE_TABLET | Freq: Every day | ORAL | Status: DC

## 2013-09-03 MED ORDER — FAMOTIDINE 20 MG PO TABS: ORAL_TABLET | ORAL | Status: DC

## 2013-09-03 NOTE — Progress Notes (Signed)
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  Admit Date: 06/09/2013  Admitting Physician: Tarry Kos, MD  PCP:No PCP Per Patient  Recommendations for Outpatient Follow-up:  1. Anemia work up  PRIMARY DISCHARGE DIAGNOSIS:  Principal Problem:  Acute respiratory failure  Active Problems:  Asthma with acute exacerbation  Anemia  SOB (shortness of breath)  Chest pain, pleuritic     07/30/2013 1st  Pulmonary office visit/ WertChief Complaint  Patient presents with  . Pulmonary Consult    Referred per Dr. Susie Cassette. The pt states she was dxed with asthma many years ago. She c/o having increased SOB for the past month. She gets out of breath with walking distances while at work.    chest pain is more of a tightness, better with saba rec Dulera 100 Take 2 puffs first thing in am and then another 2 puffs about 12 hours later.  Only use your albuterol (proair) as a rescue medication GERD diet    09/03/2013 f/u ov/Fadumo Heng re: asthma/ chronic overuse of B2 Chief Complaint  Patient presents with  . Follow-up    Pt states SOB is slightly improved. She states using neb four times daily and also using rescue inhaler 8-9 times daily and has been using meds this way every since her last visit. She c/o cough since last visit- non prod.    Wakes up each am and uses neb before anything else.  Prednisone usually helps a lot but then wears off over a week  No obvious day to day or daytime variabilty or assoc chronic cough or cp or chest tightness, subjective wheeze overt sinus or hb symptoms. No unusual exp hx or h/o childhood pna/ asthma or knowledge of premature birth.  Sleeping ok without nocturnal  or early am exacerbation  of respiratory  c/o's or need for noct  saba. Also denies any obvious fluctuation of symptoms with weather or environmental changes or other aggravating or alleviating factors except as outlined above   Current Medications, Allergies, Complete Past Medical History, Past Surgical History, Family History, and Social History were reviewed in [employer] record.  ROS  The following are not active complaints unless bolded sore throat, dysphagia, dental problems, itching, sneezing,  nasal congestion or excess/ purulent secretions, ear ache,   fever, chills, sweats, unintended wt loss, pleuritic or exertional cp, hemoptysis,  orthopnea pnd or leg swelling, presyncope, palpitations, heartburn, abdominal pain, anorexia, nausea, vomiting, diarrhea  or change in bowel or urinary habits, change in stools or urine, dysuria,hematuria,  rash, arthralgias, visual complaints, headache, numbness weakness or ataxia or problems with walking or coordination,  change in mood/affect or memory.              Objective:   Physical Exam  amb hoarse bf nad 3 h p last saba 09/03/2013       239  Wt Readings from Last 3 Encounters:  07/30/13 244 lb 9.6 oz (110.95 kg)  06/23/13 239 lb 9.6 oz (108.682 kg)  06/11/13 244 lb 4.8 oz (110.814 kg)     HEENT: nl dentition, turbinates, and orophanx. Nl external ear canals without cough reflex   NECK :  without JVD/Nodes/TM/ nl carotid upstrokes bilaterally   LUNGS:  no acc muscle use, clear to A and P bilaterally without cough on insp or exp maneuvers   CV:  RRR  no s3 or murmur or increase in P2, no edema   ABD:  soft and nontender with nl excursion in the supine position. No bruits or organomegaly, bowel sounds nl  MS:  warm without deformities, calf tenderness, cyanosis or clubbing  SKIN: warm and dry without lesions    NEURO:  alert, approp, no deficits     CTa chest 06/09/13 1. No evidence of pulmonary embolism.  2. No acute findings in the thorax to account for the  patient's  symptoms.  3. Status post cholecystectomy.  4. Mild cardiomegaly.          Assessment & Plan:

## 2013-09-03 NOTE — Patient Instructions (Addendum)
Prednisone 10 mg take  4 each am x 2 days,   2 each am x 2 days,  1 each am x 2 days and stop   Plan A = automatic Dulera 100 Take 2 puffs first thing in am and then another 2 puffs about 12 hours later.  Pantoprazole (protonix) 40 mg   Take 30-60 min before first meal of the day and Pepcid 20 mg one bedtime until return to office - this is the best way to tell whether stomach acid is contributing to your problem.    Plan B = Backup = proaire up to 2 puffs every 4 hours but only after using dulera   Plan C = Nebulizer = backs up plan B, only use this after you use proaire  Please schedule a follow up office visit in 2 weeks, sooner if needed with inhalers in hand Given one dulera sample, hers has 20 left

## 2013-09-03 NOTE — Assessment & Plan Note (Signed)
DDX of  difficult airways managment all start with A and  include Adherence, Ace Inhibitors, Acid Reflux, Active Sinus Disease, Alpha 1 Antitripsin deficiency, Anxiety masquerading as Airways dz,  ABPA,  allergy(esp in young), Aspiration (esp in elderly), Adverse effects of DPI,  Active smokers, plus two Bs  = Bronchiectasis and Beta blocker use..and one C= CHF  Adherence is always the initial "prime suspect" and is a multilayered concern that requires a "trust but verify" approach in every patient - starting with knowing how to use medications, especially inhalers, correctly, keeping up with refills and understanding the fundamental difference between maintenance and prns vs those medications only taken for a very short course and then stopped and not refilled. The proper method of use, as well as anticipated side effects, of a metered-dose inhaler are discussed and demonstrated to the patient. Improved effectiveness after extensive coaching during this visit to a level of approximately  75% from a baseline of < 25%  ? Acid (or non-acid) GERD > always difficult to exclude as up to 75% of pts in some series report no assoc GI/ Heartburn symptoms> rec max (24h)  acid suppression and diet restrictions/ reviewed and instructions given in writting

## 2013-09-17 ENCOUNTER — Ambulatory Visit: Payer: Self-pay | Admitting: Internal Medicine

## 2013-09-23 ENCOUNTER — Ambulatory Visit: Payer: Self-pay | Admitting: Internal Medicine

## 2014-03-15 ENCOUNTER — Emergency Department (HOSPITAL_COMMUNITY): Payer: Self-pay

## 2014-03-15 ENCOUNTER — Encounter (HOSPITAL_COMMUNITY): Payer: Self-pay | Admitting: Emergency Medicine

## 2014-03-15 ENCOUNTER — Emergency Department (HOSPITAL_COMMUNITY)
Admission: EM | Admit: 2014-03-15 | Discharge: 2014-03-16 | Disposition: A | Discharge status: Home / Self Care | Payer: Self-pay | Attending: Emergency Medicine | Admitting: Emergency Medicine

## 2014-03-15 DIAGNOSIS — E669 Obesity, unspecified: Secondary | ICD-10-CM | POA: Insufficient documentation

## 2014-03-15 DIAGNOSIS — J45901 Unspecified asthma with (acute) exacerbation: Secondary | ICD-10-CM | POA: Insufficient documentation

## 2014-03-15 DIAGNOSIS — R51 Headache: Secondary | ICD-10-CM | POA: Insufficient documentation

## 2014-03-15 DIAGNOSIS — Z79899 Other long term (current) drug therapy: Secondary | ICD-10-CM | POA: Insufficient documentation

## 2014-03-15 DIAGNOSIS — IMO0002 Reserved for concepts with insufficient information to code with codable children: Secondary | ICD-10-CM | POA: Insufficient documentation

## 2014-03-15 DIAGNOSIS — M545 Low back pain, unspecified: Secondary | ICD-10-CM | POA: Insufficient documentation

## 2014-03-15 DIAGNOSIS — Z862 Personal history of diseases of the blood and blood-forming organs and certain disorders involving the immune mechanism: Secondary | ICD-10-CM | POA: Insufficient documentation

## 2014-03-15 LAB — CBC WITH DIFFERENTIAL/PLATELET
Basophils Absolute: 0 10*3/uL (ref 0.0–0.1)
Basophils Relative: 0 % (ref 0–1)
Eosinophils Absolute: 0.1 10*3/uL (ref 0.0–0.7)
Eosinophils Relative: 1 % (ref 0–5)
HEMATOCRIT: 33.7 % — AB (ref 36.0–46.0)
HEMOGLOBIN: 10.6 g/dL — AB (ref 12.0–15.0)
LYMPHS ABS: 1.7 10*3/uL (ref 0.7–4.0)
LYMPHS PCT: 28 % (ref 12–46)
MCH: 23.9 pg — ABNORMAL LOW (ref 26.0–34.0)
MCHC: 31.5 g/dL (ref 30.0–36.0)
MCV: 75.9 fL — ABNORMAL LOW (ref 78.0–100.0)
MONO ABS: 0.2 10*3/uL (ref 0.1–1.0)
MONOS PCT: 4 % (ref 3–12)
NEUTROS ABS: 3.9 10*3/uL (ref 1.7–7.7)
Neutrophils Relative %: 67 % (ref 43–77)
Platelets: 168 10*3/uL (ref 150–400)
RBC: 4.44 MIL/uL (ref 3.87–5.11)
RDW: 13 % (ref 11.5–15.5)
WBC: 5.9 10*3/uL (ref 4.0–10.5)

## 2014-03-15 LAB — BASIC METABOLIC PANEL WITH GFR
BUN: 13 mg/dL (ref 6–23)
CO2: 20 meq/L (ref 19–32)
Calcium: 8.4 mg/dL (ref 8.4–10.5)
Chloride: 109 meq/L (ref 96–112)
Creatinine, Ser: 0.82 mg/dL (ref 0.50–1.10)
GFR calc Af Amer: >90 mL/min
GFR calc non Af Amer: 90 mL/min — ABNORMAL LOW
Glucose, Bld: 102 mg/dL — ABNORMAL HIGH (ref 70–99)
Potassium: 3.3 meq/L — ABNORMAL LOW (ref 3.7–5.3)
Sodium: 143 meq/L (ref 137–147)

## 2014-03-15 MED ORDER — ALBUTEROL SULFATE (2.5 MG/3ML) 0.083% IN NEBU
INHALATION_SOLUTION | RESPIRATORY_TRACT | Status: AC
  Filled 2014-03-15: qty 6

## 2014-03-15 MED ORDER — SODIUM CHLORIDE 0.9 % IV BOLUS (SEPSIS)
500.0000 mL | Freq: Once | INTRAVENOUS | Status: AC
  Administered 2014-03-15: 500 mL via INTRAVENOUS

## 2014-03-15 MED ORDER — POTASSIUM CHLORIDE CRYS ER 20 MEQ PO TBCR
40.0000 meq | EXTENDED_RELEASE_TABLET | Freq: Once | ORAL | Status: AC
  Administered 2014-03-16: 40 meq via ORAL
  Filled 2014-03-15: qty 2

## 2014-03-15 MED ORDER — ALBUTEROL (5 MG/ML) CONTINUOUS INHALATION SOLN
10.0000 mg/h | INHALATION_SOLUTION | Freq: Once | RESPIRATORY_TRACT | Status: AC
  Administered 2014-03-15: 10 mg/h via RESPIRATORY_TRACT

## 2014-03-15 MED ORDER — ACETAMINOPHEN 325 MG PO TABS
650.0000 mg | ORAL_TABLET | Freq: Once | ORAL | Status: AC
  Administered 2014-03-15: 650 mg via ORAL
  Filled 2014-03-15: qty 2

## 2014-03-15 MED ORDER — ALBUTEROL (5 MG/ML) CONTINUOUS INHALATION SOLN
10.0000 mg/h | INHALATION_SOLUTION | Freq: Once | RESPIRATORY_TRACT | Status: AC
  Administered 2014-03-15: 10 mg/h via RESPIRATORY_TRACT
  Filled 2014-03-15: qty 20

## 2014-03-15 MED ORDER — METHYLPREDNISOLONE SODIUM SUCC 125 MG IJ SOLR
125.0000 mg | INTRAMUSCULAR | Status: AC
  Administered 2014-03-15: 125 mg via INTRAVENOUS
  Filled 2014-03-15: qty 2

## 2014-03-15 NOTE — ED Notes (Signed)
5mg  albuterol being administered.

## 2014-03-15 NOTE — ED Notes (Signed)
Pt breathing has improved some. o2 sat 97% RA.

## 2014-03-15 NOTE — ED Provider Notes (Signed)
Ruthell Rummage Tamla Winkels 11:30PM patient discussed in sign out. Patient with history of asthma with worsening asthma exacerbation. Patient receiving second continuous neb. We'll plan to reassess her condition. Patient initially reluctant for any admission but we will reassess.  12:15AM Pt re-evaluated.  Pt reports feeling better after second continuous treatment.  She is tachycardic from the medication and continues to be tachypneic.  I recommended that she be admitted to the hospital for status asthmaticus however she did not wish to stay any longer.  I explained the risk of leaving at this time and also offered to continue to monitor to be sure her symptoms were improving.  She did not wish to stay in the ED either and wished to be d/c home at this time.  I gave the pt strict return precautions.  She is able to make her own medical decisions.   Martie Lee, PA-C 03/16/14 Benancio Deeds

## 2014-03-15 NOTE — ED Notes (Signed)
Pt with sudden onset asthma attack at 4pm. Pt coughing with audible wheezing. o2 sats 97% room air.

## 2014-03-15 NOTE — ED Provider Notes (Signed)
CSN: [id number]     Arrival date & time 03/15/14  2002 History   First MD Initiated Contact with Patient 03/15/14 2104     Chief Complaint  Patient presents with  . Asthma     (Consider location/radiation/quality/duration/timing/severity/associated sxs/prior Treatment) Patient is a 38 y.o. female presenting with asthma.  Asthma This is a chronic problem. The current episode started 12 to 24 hours ago. The problem occurs constantly. The problem has been gradually worsening. Associated symptoms include headaches and shortness of breath. Pertinent negatives include no chest pain and no abdominal pain. Nothing aggravates the symptoms. Nothing relieves the symptoms. Treatments tried: albuterol. The treatment provided mild relief.    Past Medical History  Diagnosis Date  . Asthma   . Anemia   . Obesity   . Abdominal pain   . Vomiting   . Diarrhea   . Shortness of breath    Past Surgical History  Procedure Laterality Date  . Cholecystectomy  06/19/11  . Tubal ligation     Family History  Problem Relation Age of Onset  . Diabetes Mother 56  . Emphysema Father 31    smoked  . Emphysema Maternal Aunt     smoked  . Lung cancer Maternal Aunt     smoked  . Bladder Cancer Mother   . Heart disease Maternal Grandfather   . Heart disease Maternal Aunt   . Heart disease Sister    History  Substance Use Topics  . Smoking status: Never Smoker   . Smokeless tobacco: Never Used  . Alcohol Use: No   OB History   Grav Para Term Preterm Abortions TAB SAB Ect Mult Living   1              Review of Systems  Constitutional: Negative for fever and fatigue.  HENT: Negative for congestion and drooling.   Eyes: Negative for pain.  Respiratory: Positive for cough, shortness of breath and wheezing.   Cardiovascular: Negative for chest pain.  Gastrointestinal: Negative for nausea, vomiting, abdominal pain and diarrhea.  Genitourinary: Negative for dysuria and hematuria.  Musculoskeletal:  Positive for back pain (left lower back). Negative for gait problem and neck pain.  Skin: Negative for color change.  Neurological: Positive for headaches. Negative for dizziness.  Hematological: Negative for adenopathy.  Psychiatric/Behavioral: Negative for behavioral problems.  All other systems reviewed and are negative.     Allergies  Review of patient's allergies indicates no known allergies.  Home Medications   Prior to Admission medications   Medication Sig Start Date End Date Taking? Authorizing Provider  acetaminophen (TYLENOL) 500 MG tablet Take 1,000 mg by mouth every 6 (six) hours as needed. pain   Yes Historical Provider, MD  albuterol (PROVENTIL HFA;VENTOLIN HFA) 108 (90 BASE) MCG/ACT inhaler Inhale 2 puffs into the lungs every 4 (four) hours as needed for wheezing. wheezing 06/23/13  Yes Reyne Dumas, MD  albuterol (PROVENTIL) (2.5 MG/3ML) 0.083% nebulizer solution Take 3 mL (2.5 mg total) by nebulization every 6 (six) hours as needed for wheezing or shortness of breath. wheezing 06/12/13  Yes Shanker Kristeen Mans, MD  famotidine (PEPCID) 20 MG tablet One at bedtime 09/03/13  Yes Tanda Rockers, MD  mometasone-formoterol [facility]) 100-5 MCG/ACT AERO Take 2 puffs first thing in am and then another 2 puffs about 12 hours later. 07/30/13  Yes Tanda Rockers, MD   BP 119/64  Pulse 99  Temp(Src) 98.6 F (37 C) (Oral)  Resp 30  SpO2 97%  LMP 03/13/2014 Physical Exam  Nursing note and vitals reviewed. Constitutional: She is oriented to person, place, and time. She appears well-developed and well-nourished.  HENT:  Head: Normocephalic.  Mouth/Throat: Oropharynx is clear and moist. No oropharyngeal exudate.  Eyes: Conjunctivae and EOM are normal. Pupils are equal, round, and reactive to light.  Neck: Normal range of motion. Neck supple.  Cardiovascular: Normal rate, regular rhythm, normal heart sounds and intact distal pulses.  Exam reveals no gallop and no friction rub.   No  murmur heard. Pulmonary/Chest: She is in respiratory distress (mild tachypnea). She has wheezes (diffuse mild wheezing).  Abdominal: Soft. Bowel sounds are normal. There is no tenderness. There is no rebound and no guarding.  Musculoskeletal: Normal range of motion. She exhibits tenderness (mild tenderness to palpation of the left lumbar paraspinal area.). She exhibits no edema.  Neurological: She is alert and oriented to person, place, and time.  Skin: Skin is warm and dry.  Psychiatric: She has a normal mood and affect. Her behavior is normal.    ED Course  Procedures (including critical care time) Labs Review Labs Reviewed  CBC WITH DIFFERENTIAL - Abnormal; Notable for the following:    Hemoglobin 10.6 (*)    HCT 33.7 (*)    MCV 75.9 (*)    MCH 23.9 (*)    All other components within normal limits  BASIC METABOLIC PANEL - Abnormal; Notable for the following:    Potassium 3.3 (*)    Glucose, Bld 102 (*)    GFR calc non Af Amer 90 (*)    All other components within normal limits    Imaging Review Dg Chest Port 1 View  03/15/2014   CLINICAL DATA:  Asthma.  Shortness of breath.  EXAM: PORTABLE CHEST - 1 VIEW  COMPARISON:  Chest x-ray 06/11/2013.  FINDINGS: Lung volumes are low. There are some ill-defined bibasilar opacities, favored to predominantly reflect subsegmental atelectasis. Underlying airspace consolidation left base from sequela of recent aspiration or developing infection is not excluded. No definite pleural effusions. No evidence of pulmonary edema. Heart size appears borderline enlarged, likely accentuated by low lung volumes. Upper mediastinal contours are within normal limits.  IMPRESSION: 1. Low lung volumes with probable bibasilar subsegmental atelectasis. 2. Borderline cardiomegaly.   Electronically Signed   By: Vinnie Langton M.D.   On: 03/15/2014 22:39     EKG Interpretation None      MDM   Final diagnoses:  Asthma with acute exacerbation    9:27 PM 38  y.o. female with a history of asthma presents with shortness of breath that began this morning consistent with previous asthma exacerbations. She reports a history of multiple asthma exacerbations in the past with one intubation. She used her typical inhalers at home without relief. She has gotten one albuterol neb here prior to my evaluation. She remains mildly tachypneic with diffuse wheezing. Will place on continuous neb, give steroids, small bolus, and screening chest x-ray.  Transferred care to [facility]. Pt currently getting 2nd neb. She has improved significantly. She would prefer to go home. Will allow her to finish neb and plan for reassessment.     Blanchard Kelch, MD 03/16/14 [phone]

## 2014-03-15 NOTE — ED Notes (Signed)
MD at bedside. 

## 2014-03-15 NOTE — ED Notes (Signed)
Patient received hour long neb treatment with some slight improvement. She continues to cough and unable to complete a full sentence without becoming short of breath. EDP made aware.

## 2014-03-16 MED ORDER — ALBUTEROL SULFATE HFA 108 (90 BASE) MCG/ACT IN AERS: 1.0000 | INHALATION_SPRAY | Freq: Four times a day (QID) | RESPIRATORY_TRACT | Status: DC | PRN

## 2014-03-16 MED ORDER — PREDNISONE 10 MG PO TABS: 20.0000 mg | ORAL_TABLET | Freq: Every day | ORAL | Status: DC

## 2014-03-16 MED ORDER — ALBUTEROL SULFATE HFA 108 (90 BASE) MCG/ACT IN AERS
2.0000 | INHALATION_SPRAY | RESPIRATORY_TRACT | Status: DC | PRN
  Administered 2014-03-16: 2 via RESPIRATORY_TRACT
  Filled 2014-03-16: qty 6.7

## 2014-03-16 NOTE — Discharge Instructions (Signed)
You were seen and treated for your asthma attack.  Your provider(s) today were worried about your asthma symptoms and recommended staying in the hospital to be admitted and given further treatment for your asthma, however you did not wish to stay.  Please use the medications that were prescribed to you as instructed and follow up with a primary care provider.  Return at any time if your symptoms worsen.   Asthma, Adult Asthma is a recurring condition in which the airways tighten and narrow. Asthma can make it difficult to breathe. It can cause coughing, wheezing, and shortness of breath. Asthma episodes (also called asthma attacks) range from minor to life-threatening. Asthma cannot be cured, but medicines and lifestyle changes can help control it. CAUSES Asthma is believed to be caused by inherited (genetic) and environmental factors, but its exact cause is unknown. Asthma may be triggered by allergens, lung infections, or irritants in the air. Asthma triggers are different for each person. Common triggers include:   Animal dander.  Dust mites.  Cockroaches.  Pollen from trees or grass.  Mold.  Smoke.  Air pollutants such as dust, household cleaners, hair sprays, aerosol sprays, paint fumes, strong chemicals, or strong odors.  Cold air, weather changes, and winds (which increase molds and pollens in the air).  Strong emotional expressions such as crying or laughing hard.  Stress.  Certain medicines (such as aspirin) or types of drugs (such as beta-blockers).  Sulfites in foods and drinks. Foods and drinks that may contain sulfites include dried fruit, potato chips, and sparkling grape juice.  Infections or inflammatory conditions such as the flu, a cold, or an inflammation of the nasal membranes (rhinitis).  Gastroesophageal reflux disease (GERD).  Exercise or strenuous activity. SYMPTOMS Symptoms may occur immediately after asthma is triggered or many hours later. Symptoms  include:  Wheezing.  Excessive nighttime or early morning coughing.  Frequent or severe coughing with a common cold.  Chest tightness.  Shortness of breath. DIAGNOSIS  The diagnosis of asthma is made by a review of your medical history and a physical exam. Tests may also be performed. These may include:  Lung function studies. These tests show how much air you breath in and out.  Allergy tests.  Imaging tests such as X-rays. TREATMENT  Asthma cannot be cured, but it can usually be controlled. Treatment involves identifying and avoiding your asthma triggers. It also involves medicines. There are 2 classes of medicine used for asthma treatment:   Controller medicines. These prevent asthma symptoms from occurring. They are usually taken every day.  Reliever or rescue medicines. These quickly relieve asthma symptoms. They are used as needed and provide short-term relief. Your health care provider will help you create an asthma action plan. An asthma action plan is a written plan for managing and treating your asthma attacks. It includes a list of your asthma triggers and how they may be avoided. It also includes information on when medicines should be taken and when their dosage should be changed. An action plan may also involve the use of a device called a peak flow meter. A peak flow meter measures how well the lungs are working. It helps you monitor your condition. HOME CARE INSTRUCTIONS   Take medicine as directed by your health care provider. Speak with your health care provider if you have questions about how or when to take the medicines.  Use a peak flow meter as directed by your health care provider. Record and keep track of  readings.  Understand and use the action plan to help minimize or stop an asthma attack without needing to seek medical care.  Control your home environment in the following ways to help prevent asthma attacks:  Do not smoke. Avoid being exposed to  secondhand smoke.  Change your heating and air conditioning filter regularly.  Limit your use of fireplaces and wood stoves.  Get rid of pests (such as roaches and mice) and their droppings.  Throw away plants if you see mold on them.  Clean your floors and dust regularly. Use unscented cleaning products.  Try to have someone else vacuum for you regularly. Stay out of rooms while they are being vacuumed and for a short while afterward. If you vacuum, use a dust mask from a hardware store, a double-layered or microfilter vacuum cleaner bag, or a vacuum cleaner with a HEPA filter.  Replace carpet with wood, tile, or vinyl flooring. Carpet can trap dander and dust.  Use allergy-proof pillows, mattress covers, and box spring covers.  Wash bed sheets and blankets every week in hot water and dry them in a dryer.  Use blankets that are made of polyester or cotton.  Clean bathrooms and kitchens with bleach. If possible, have someone repaint the walls in these rooms with mold-resistant paint. Keep out of the rooms that are being cleaned and painted.  Wash hands frequently. SEEK MEDICAL CARE IF:   You have wheezing, shortness of breath, or a cough even if taking medicine to prevent attacks.  The colored mucus you cough up (sputum) is thicker than usual.  Your sputum changes from clear or white to yellow, green, gray, or bloody.  You have any problems that may be related to the medicines you are taking (such as a rash, itching, swelling, or trouble breathing).  You are using a reliever medicine more than 2 3 times per week.  Your peak flow is still at 50 79% of you personal best after following your action plan for 1 hour. SEEK IMMEDIATE MEDICAL CARE IF:   You seem to be getting worse and are unresponsive to treatment during an asthma attack.  You are short of breath even at rest.  You get short of breath when doing very little physical activity.  You have difficulty eating,  drinking, or talking due to asthma symptoms.  You develop chest pain.  You develop a fast heartbeat.  You have a bluish color to your lips or fingernails.  You are lightheaded, dizzy, or faint.  Your peak flow is less than 50% of your personal best.  You have a fever or persistent symptoms for more than 2 3 days.  You have a fever and symptoms suddenly get worse. MAKE SURE YOU:   Understand these instructions.  Will watch your condition.  Will get help right away if you are not doing well or get worse. Document Released: 10/15/2005 Document Revised: 06/17/2013 Document Reviewed: 05/14/2013 [facility] Patient Information 2014 [place], [place].   Asthma Attack Prevention Although there is no way to prevent asthma from starting, you can take steps to control the disease and reduce its symptoms. Learn about your asthma and how to control it. Take an active role to control your asthma by working with your health care provider to create and follow an asthma action plan. An asthma action plan guides you in:  Taking your medicines properly.  Avoiding things that set off your asthma or make your asthma worse (asthma triggers).  Tracking your level of asthma  control.  Responding to worsening asthma.  Seeking emergency care when needed. To track your asthma, keep records of your symptoms, check your peak flow number using a handheld device that shows how well air moves out of your lungs (peak flow meter), and get regular asthma checkups.  WHAT ARE SOME WAYS TO PREVENT AN ASTHMA ATTACK?  Take medicines as directed by your health care provider.  Keep track of your asthma symptoms and level of control.  With your health care provider, write a detailed plan for taking medicines and managing an asthma attack. Then be sure to follow your action plan. Asthma is an ongoing condition that needs regular monitoring and treatment.  Identify and avoid asthma triggers. Many outdoor allergens and  irritants (such as pollen, mold, cold air, and air pollution) can trigger asthma attacks. Find out what your asthma triggers are and take steps to avoid them.  Monitor your breathing. Learn to recognize warning signs of an attack, such as coughing, wheezing, or shortness of breath. Your lung function may decrease before you notice any signs or symptoms, so regularly measure and record your peak airflow with a home peak flow meter.  Identify and treat attacks early. If you act quickly, you are less likely to have a severe attack. You will also need less medicine to control your symptoms. When your peak flow measurements decrease and alert you to an upcoming attack, take your medicine as instructed and immediately stop any activity that may have triggered the attack. If your symptoms do not improve, get medical help.  Pay attention to increasing quick-relief inhaler use. If you find yourself relying on your quick-relief inhaler, your asthma is not under control. See your health care provider about adjusting your treatment. WHAT CAN MAKE MY SYMPTOMS WORSE? A number of common things can set off or make your asthma symptoms worse and cause temporary increased inflammation of your airways. Keep track of your asthma symptoms for several weeks, detailing all the environmental and emotional factors that are linked with your asthma. When you have an asthma attack, go back to your asthma diary to see which factor, or combination of factors, might have contributed to it. Once you know what these factors are, you can take steps to control many of them. If you have allergies and asthma, it is important to take asthma prevention steps at home. Minimizing contact with the substance to which you are allergic will help prevent an asthma attack. Some triggers and ways to avoid these triggers are: Animal Dander:  Some people are allergic to the flakes of skin or dried saliva from animals with fur or feathers.   There is no  such thing as a hypoallergenic dog or cat breed. All dogs or cats can cause allergies, even if they don't shed.  Keep these pets out of your home.  If you are not able to keep a pet outdoors, keep the pet out of your bedroom and other sleeping areas at all times, and keep the door closed.  Remove carpets and furniture covered with cloth from your home. If that is not possible, keep the pet away from fabric-covered furniture and carpets. Dust Mites: Many people with asthma are allergic to dust mites. Dust mites are tiny bugs that are found in every home in mattresses, pillows, carpets, fabric-covered furniture, bedcovers, clothes, stuffed toys, and other fabric-covered items.   Cover your mattress in a special dust-proof cover.  Cover your pillow in a special dust-proof cover, or wash the  pillow each week in hot water. Water must be hotter than 130 F (54.4 C) to kill dust mites. Cold or warm water used with detergent and bleach can also be effective.  Wash the sheets and blankets on your bed each week in hot water.  Try not to sleep or lie on cloth-covered cushions.  Call ahead when traveling and ask for a smoke-free hotel room. Bring your own bedding and pillows in case the hotel only supplies feather pillows and down comforters, which may contain dust mites and cause asthma symptoms.  Remove carpets from your bedroom and those laid on concrete, if you can.  Keep stuffed toys out of the bed, or wash the toys weekly in hot water or cooler water with detergent and bleach. Cockroaches: Many people with asthma are allergic to the droppings and remains of cockroaches.   Keep food and garbage in closed containers. Never leave food out.  Use poison baits, traps, powders, gels, or paste (for example, boric acid).  If a spray is used to kill cockroaches, stay out of the room until the odor goes away. Indoor Mold:  Fix leaky faucets, pipes, or other sources of water that have mold around  them.  Clean floors and moldy surfaces with a fungicide or diluted bleach.  Avoid using humidifiers, vaporizers, or swamp coolers. These can spread molds through the air. Pollen and Outdoor Mold:  When pollen or mold spore counts are high, try to keep your windows closed.  Stay indoors with windows closed from late morning to afternoon. Pollen and some mold spore counts are highest at that time.  Ask your health care provider whether you need to take anti-inflammatory medicine or increase your dose of the medicine before your allergy season starts. Other Irritants to Avoid:  Tobacco smoke is an irritant. If you smoke, ask your health care provider how you can quit. Ask family members to quit smoking too. Do not allow smoking in your home or car.  If possible, do not use a wood-burning stove, kerosene heater, or fireplace. Minimize exposure to all sources of smoke, including to incense, candles, fires, and fireworks.  Try to stay away from strong odors and sprays, such as perfume, talcum powder, hair spray, and paints.  Decrease humidity in your home and use an indoor air cleaning device. Reduce indoor humidity to below 60%. Dehumidifiers or central air conditioners can do this.  Decrease house dust exposure by changing furnace and air cooler filters frequently.  Try to have someone else vacuum for you once or twice a week. Stay out of rooms while they are being vacuumed and for a short while afterward.  If you vacuum, use a dust mask from a hardware store, a double-layered or microfilter vacuum cleaner bag, or a vacuum cleaner with a HEPA filter.  Sulfites in foods and beverages can be irritants. Do not drink beer or wine or eat dried fruit, processed potatoes, or shrimp if they cause asthma symptoms.  Cold air can trigger an asthma attack. Cover your nose and mouth with a scarf on cold or windy days.  Several health conditions can make asthma more difficult to manage, including a  runny nose, sinus infections, reflux disease, psychological stress, and sleep apnea. Work with your health care provider to manage these conditions.  Avoid close contact with people who have a respiratory infection such as a cold or the flu, since your asthma symptoms may get worse if you catch the infection. Wash your hands thoroughly after  touching items that may have been handled by people with a respiratory infection.  Get a flu shot every year to protect against the flu virus, which often makes asthma worse for days or weeks. Also get a pneumonia shot if you have not previously had one. Unlike the flu shot, the pneumonia shot does not need to be given yearly. Medicines:  Talk to your health care provider about whether it is safe for you to take aspirin or non-steroidal anti-inflammatory medicines (NSAIDs). In a small number of people with asthma, aspirin and NSAIDs can cause asthma attacks. These medicines must be avoided by people who have known aspirin-sensitive asthma. It is important that people with aspirin-sensitive asthma read labels of all over-the-counter medicines used to treat pain, colds, coughs, and fever.  Beta blockers and ACE inhibitors are other medicines you should discuss with your health care provider. HOW CAN I FIND OUT WHAT I AM ALLERGIC TO? Ask your asthma health care provider about allergy skin testing or blood testing (the RAST test) to identify the allergens to which you are sensitive. If you are found to have allergies, the most important thing to do is to try to avoid exposure to any allergens that you are sensitive to as much as possible. Other treatments for allergies, such as medicines and allergy shots (immunotherapy) are available.  CAN I EXERCISE? Follow your health care provider's advice regarding asthma treatment before exercising. It is important to maintain a regular exercise program, but vigorous exercise, or exercise in cold, humid, or dry environments can  cause asthma attacks, especially for those people who have exercise-induced asthma. Document Released: 10/03/2009 Document Revised: 06/17/2013 Document Reviewed: 04/22/2013 [facility] Patient Information 2014 [place].

## 2014-03-16 NOTE — ED Provider Notes (Signed)
Medical screening examination/treatment/procedure(s) were conducted as a shared visit with non-physician practitioner(s) and myself.  I personally evaluated the patient during the encounter.   EKG Interpretation None      See my full note for further details.   Blanchard Kelch, MD 03/16/14 1242

## 2014-08-13 ENCOUNTER — Other Ambulatory Visit: Payer: Self-pay

## 2014-08-30 ENCOUNTER — Encounter (HOSPITAL_COMMUNITY): Payer: Self-pay | Admitting: Emergency Medicine

## 2014-11-20 ENCOUNTER — Encounter (HOSPITAL_COMMUNITY): Payer: Self-pay | Admitting: *Deleted

## 2014-11-20 ENCOUNTER — Inpatient Hospital Stay (HOSPITAL_COMMUNITY)
Admission: AD | Admit: 2014-11-20 | Discharge: 2014-11-20 | Disposition: A | Discharge status: Home / Self Care | Payer: Self-pay | Source: Ambulatory Visit | Attending: Obstetrics and Gynecology | Admitting: Obstetrics and Gynecology

## 2014-11-20 ENCOUNTER — Inpatient Hospital Stay (HOSPITAL_COMMUNITY): Payer: Self-pay

## 2014-11-20 DIAGNOSIS — N939 Abnormal uterine and vaginal bleeding, unspecified: Secondary | ICD-10-CM | POA: Insufficient documentation

## 2014-11-20 DIAGNOSIS — D251 Intramural leiomyoma of uterus: Secondary | ICD-10-CM | POA: Insufficient documentation

## 2014-11-20 LAB — WET PREP, GENITAL
CLUE CELLS WET PREP: NONE SEEN
TRICH WET PREP: NONE SEEN
YEAST WET PREP: NONE SEEN

## 2014-11-20 LAB — POCT PREGNANCY, URINE: PREG TEST UR: NEGATIVE

## 2014-11-20 LAB — CBC
HEMATOCRIT: 35.4 % — AB (ref 36.0–46.0)
HEMOGLOBIN: 11 g/dL — AB (ref 12.0–15.0)
MCH: 23.6 pg — AB (ref 26.0–34.0)
MCHC: 31.1 g/dL (ref 30.0–36.0)
MCV: 76 fL — ABNORMAL LOW (ref 78.0–100.0)
Platelets: 185 10*3/uL (ref 150–400)
RBC: 4.66 MIL/uL (ref 3.87–5.11)
RDW: 13.5 % (ref 11.5–15.5)
WBC: 7.3 10*3/uL (ref 4.0–10.5)

## 2014-11-20 LAB — URINALYSIS, ROUTINE W REFLEX MICROSCOPIC
BILIRUBIN URINE: NEGATIVE
GLUCOSE, UA: NEGATIVE mg/dL
KETONES UR: NEGATIVE mg/dL
Leukocytes, UA: NEGATIVE
NITRITE: NEGATIVE
Protein, ur: NEGATIVE mg/dL
Specific Gravity, Urine: 1.025 (ref 1.005–1.030)
UROBILINOGEN UA: 1 mg/dL (ref 0.0–1.0)
pH: 6 (ref 5.0–8.0)

## 2014-11-20 LAB — URINE MICROSCOPIC-ADD ON

## 2014-11-20 MED ORDER — MEGESTROL ACETATE 40 MG PO TABS: ORAL_TABLET | ORAL | Status: DC

## 2014-11-20 NOTE — MAU Note (Signed)
Pt states that she has been bleeding since 11/06/2014.  It started out like a period but keeps getting worse.  Is not on any form of birth control but has had her tubes tied.

## 2014-11-20 NOTE — [facility] Provider Note (Signed)
Chief Complaint: Vaginal Bleeding   First Provider Initiated Contact with Patient 11/20/14 1451     SUBJECTIVE HPI: [name] is a 39 y.o. [id number] who presents to maternity admissions reporting vaginal bleeding x 14 days, heavy at times, moderate at other times accompanied by menstrual-like cramping.  She reports she usually has light 3 day periods that are regular.  She has never had irregular bleeding like this.  She does not have a regular Gyn provider at this time.  She denies vaginal itching/burning, urinary symptoms, h/a, dizziness, n/v, or fever/chills.     Past Medical History  Diagnosis Date  . Asthma   . Anemia   . Obesity   . Abdominal pain   . Vomiting   . Diarrhea   . Shortness of breath    Past Surgical History  Procedure Laterality Date  . Cholecystectomy  06/19/11  . Tubal ligation     History   Social History  . Marital Status: Single    Spouse Name: N/A    Number of Children: N/A  . Years of Education: N/A   Occupational History  . Not on file.   Social History Main Topics  . Smoking status: Never Smoker   . Smokeless tobacco: Never Used  . Alcohol Use: No  . Drug Use: No  . Sexual Activity: Not on file   Other Topics Concern  . Not on file   Social History Narrative   No current facility-administered medications on file prior to encounter.   Current Outpatient Prescriptions on File Prior to Encounter  Medication Sig Dispense Refill  . acetaminophen (TYLENOL) 500 MG tablet Take 1,000 mg by mouth every 6 (six) hours as needed. pain    . albuterol (PROVENTIL HFA;VENTOLIN HFA) 108 (90 BASE) MCG/ACT inhaler Inhale 2 puffs into the lungs every 4 (four) hours as needed for wheezing. wheezing 1 Inhaler 0  . albuterol (PROVENTIL HFA;VENTOLIN HFA) 108 (90 BASE) MCG/ACT inhaler Inhale 1-2 puffs into the lungs every 6 (six) hours as needed for wheezing or shortness of breath. 1 Inhaler 0  . albuterol (PROVENTIL) (2.5 MG/3ML) 0.083% nebulizer solution  Take 3 mL (2.5 mg total) by nebulization every 6 (six) hours as needed for wheezing or shortness of breath. wheezing 75 mL 0  . famotidine (PEPCID) 20 MG tablet One at bedtime 30 tablet 2  . mometasone-formoterol (DULERA) 100-5 MCG/ACT AERO Take 2 puffs first thing in am and then another 2 puffs about 12 hours later.    . predniSONE (DELTASONE) 10 MG tablet Take 2 tablets (20 mg total) by mouth daily. 10 tablet 0   No Known Allergies  ROS: Pertinent items in HPI  OBJECTIVE Blood pressure 135/72, pulse 73, temperature 97.6 F (36.4 C), temperature source Oral, resp. rate 18, height 5\' 9"  (1.753 m), weight 109.135 kg (240 lb 9.6 oz). GENERAL: Well-developed, well-nourished female in no acute distress.  HEENT: Normocephalic HEART: normal rate RESP: normal effort ABDOMEN: Soft, non-tender EXTREMITIES: Nontender, no edema NEURO: Alert and oriented Pelvic exam: Cervix pink, visually closed, without lesion, small amount dark red bleeding, vaginal walls and external genitalia normal Bimanual exam: Cervix 0/long/high, firm, anterior, neg CMT, uterus nontender, nonenlarged, adnexa without tenderness, enlargement, or mass  LAB RESULTS Results for orders placed or performed during the hospital encounter of 11/20/14 (from the past 24 hour(s))  Pregnancy, urine POC     Status: None   Collection Time: 11/20/14  2:42 PM  Result Value Ref Range   Preg Test, Ur  NEGATIVE NEGATIVE  Urinalysis, Routine w reflex microscopic     Status: Abnormal   Collection Time: 11/20/14  2:46 PM  Result Value Ref Range   Color, Urine YELLOW YELLOW   APPearance CLEAR CLEAR   Specific Gravity, Urine 1.025 1.005 - 1.030   pH 6.0 5.0 - 8.0   Glucose, UA NEGATIVE NEGATIVE mg/dL   Hgb urine dipstick LARGE (A) NEGATIVE   Bilirubin Urine NEGATIVE NEGATIVE   Ketones, ur NEGATIVE NEGATIVE mg/dL   Protein, ur NEGATIVE NEGATIVE mg/dL   Urobilinogen, UA 1.0 0.0 - 1.0 mg/dL   Nitrite NEGATIVE NEGATIVE   Leukocytes, UA  NEGATIVE NEGATIVE  Urine microscopic-add on     Status: None   Collection Time: 11/20/14  2:46 PM  Result Value Ref Range   Squamous Epithelial / LPF RARE RARE   RBC / HPF TOO NUMEROUS TO COUNT <3 RBC/hpf   Urine-Other MUCOUS PRESENT   Wet prep, genital     Status: Abnormal   Collection Time: 11/20/14  2:58 PM  Result Value Ref Range   Yeast Wet Prep HPF POC NONE SEEN NONE SEEN   Trich, Wet Prep NONE SEEN NONE SEEN   Clue Cells Wet Prep HPF POC NONE SEEN NONE SEEN   WBC, Wet Prep HPF POC RARE (A) NONE SEEN  CBC     Status: Abnormal   Collection Time: 11/20/14  2:59 PM  Result Value Ref Range   WBC 7.3 4.0 - 10.5 K/uL   RBC 4.66 3.87 - 5.11 MIL/uL   Hemoglobin 11.0 (L) 12.0 - 15.0 g/dL   HCT 35.4 (L) 36.0 - 46.0 %   MCV 76.0 (L) 78.0 - 100.0 fL   MCH 23.6 (L) 26.0 - 34.0 pg   MCHC 31.1 30.0 - 36.0 g/dL   RDW 13.5 11.5 - 15.5 %   Platelets 185 150 - 400 K/uL    IMAGING US Transvaginal Non-ob  11/20/2014   CLINICAL DATA:  Abnormal uterine bleeding.  EXAM: TRANSABDOMINAL AND TRANSVAGINAL ULTRASOUND OF PELVIS  TECHNIQUE: Both transabdominal and transvaginal ultrasound examinations of the pelvis were performed. Transabdominal technique was performed for global imaging of the pelvis including uterus, ovaries, adnexal regions, and pelvic cul-de-sac. It was necessary to proceed with endovaginal exam following the transabdominal exam to visualize the uterus and endometrium.  COMPARISON:  None  FINDINGS: Uterus  Measurements: 8.7 x 4.9 x 5.7 cm. There are several intramural fibroids identified. The largest is in the anterior myometrium measuring 1.8 x 1.4 x 1.9 cm. Within the uterine fundus there is a 1.5 x 1.3 x 1.7 cm intramural fibroid.  Endometrium  Thickness: 4 mm.  No focal abnormality visualized.  Right ovary  Measurements: 4.2 x 1.8 x 2.4 cm. Normal appearance/no adnexal mass.  Left ovary  Measurements: 3.4 x 2.0 x 2.2 cm. Normal appearance/no adnexal mass.  Other findings  Small amount of  free fluid noted.  IMPRESSION: 1. No findings to explain abnormal uterine bleeding. If bleeding remains unresponsive to hormonal or medical therapy, sonohysterogram should be considered for focal lesion work-up. (Ref: Radiological Reasoning: Algorithmic Workup of Abnormal Vaginal Bleeding with Endovaginal Sonography and Sonohysterography. AJR 2008; 371:G62-69) 2. Small uterine fibroids.   Electronically Signed   By: Kerby Moors M.D.   On: 11/20/2014 15:46   US Pelvis Complete  11/20/2014   CLINICAL DATA:  Abnormal uterine bleeding.  EXAM: TRANSABDOMINAL AND TRANSVAGINAL ULTRASOUND OF PELVIS  TECHNIQUE: Both transabdominal and transvaginal ultrasound examinations of the pelvis were performed. Transabdominal technique was  performed for global imaging of the pelvis including uterus, ovaries, adnexal regions, and pelvic cul-de-sac. It was necessary to proceed with endovaginal exam following the transabdominal exam to visualize the uterus and endometrium.  COMPARISON:  None  FINDINGS: Uterus  Measurements: 8.7 x 4.9 x 5.7 cm. There are several intramural fibroids identified. The largest is in the anterior myometrium measuring 1.8 x 1.4 x 1.9 cm. Within the uterine fundus there is a 1.5 x 1.3 x 1.7 cm intramural fibroid.  Endometrium  Thickness: 4 mm.  No focal abnormality visualized.  Right ovary  Measurements: 4.2 x 1.8 x 2.4 cm. Normal appearance/no adnexal mass.  Left ovary  Measurements: 3.4 x 2.0 x 2.2 cm. Normal appearance/no adnexal mass.  Other findings  Small amount of free fluid noted.  IMPRESSION: 1. No findings to explain abnormal uterine bleeding. If bleeding remains unresponsive to hormonal or medical therapy, sonohysterogram should be considered for focal lesion work-up. (Ref: Radiological Reasoning: Algorithmic Workup of Abnormal Vaginal Bleeding with Endovaginal Sonography and Sonohysterography. AJR 2008; 272:Z36-64) 2. Small uterine fibroids.   Electronically Signed   By: Kerby Moors M.D.   On:  11/20/2014 15:46    ASSESSMENT 1. Intramural leiomyoma of uterus   2. Abnormal uterine bleeding (AUB)     PLAN Discharge home Megace taper x 1 month F/U in [place] for endometrial biopsy, further assessment of AUB Return to [facility] as needed for emergencies    Medication List    TAKE these medications        acetaminophen 500 MG tablet  Commonly known as:  TYLENOL  Take 1,000 mg by mouth every 6 (six) hours as needed. pain     albuterol (2.5 MG/3ML) 0.083% nebulizer solution  Commonly known as:  PROVENTIL  Take 3 mL (2.5 mg total) by nebulization every 6 (six) hours as needed for wheezing or shortness of breath. wheezing     albuterol 108 (90 BASE) MCG/ACT inhaler  Commonly known as:  PROVENTIL HFA;VENTOLIN HFA  Inhale 2 puffs into the lungs every 4 (four) hours as needed for wheezing. wheezing     albuterol 108 (90 BASE) MCG/ACT inhaler  Commonly known as:  PROVENTIL HFA;VENTOLIN HFA  Inhale 1-2 puffs into the lungs every 6 (six) hours as needed for wheezing or shortness of breath.     famotidine 20 MG tablet  Commonly known as:  PEPCID  One at bedtime     megestrol 40 MG tablet  Commonly known as:  MEGACE  Take 3 tablets (120 mg) per day for 3 days, take 2 tablets (80 mg) per day for 3 days, then take 1 tablet daily.     mometasone-formoterol 100-5 MCG/ACT Aero  Commonly known as:  DULERA  Take 2 puffs first thing in am and then another 2 puffs about 12 hours later.     predniSONE 10 MG tablet  Commonly known as:  DELTASONE  Take 2 tablets (20 mg total) by mouth daily.       Follow-up Information    Follow up with [facility].   Specialty:  Obstetrics and Gynecology   Why:  The clinic will call you with appointment or call the number below.   Contact information:   [place] Certified Nurse-Midwife 11/20/2014  4:24 PM

## 2014-11-20 NOTE — Discharge Instructions (Signed)
Abnormal Uterine Bleeding Abnormal uterine bleeding can affect women at various stages in life, including teenagers, women in their reproductive years, pregnant women, and women who have reached menopause. Several kinds of uterine bleeding are considered abnormal, including:  Bleeding or spotting between periods.   Bleeding after sexual intercourse.   Bleeding that is heavier or more than normal.   Periods that last longer than usual.  Bleeding after menopause.  Many cases of abnormal uterine bleeding are minor and simple to treat, while others are more serious. Any type of abnormal bleeding should be evaluated by your health care provider. Treatment will depend on the cause of the bleeding. HOME CARE INSTRUCTIONS Monitor your condition for any changes. The following actions may help to alleviate any discomfort you are experiencing:  Avoid the use of tampons and douches as directed by your health care provider.  Change your pads frequently. You should get regular pelvic exams and Pap tests. Keep all follow-up appointments for diagnostic tests as directed by your health care provider.  SEEK MEDICAL CARE IF:   Your bleeding lasts more than 1 week.   You feel dizzy at times.  SEEK IMMEDIATE MEDICAL CARE IF:   You pass out.   You are changing pads every 15 to 30 minutes.   You have abdominal pain.  You have a fever.   You become sweaty or weak.   You are passing large blood clots from the vagina.   You start to feel nauseous and vomit. MAKE SURE YOU:   Understand these instructions.  Will watch your condition.  Will get help right away if you are not doing well or get worse. Document Released: 10/15/2005 Document Revised: 10/20/2013 Document Reviewed: 05/14/2013 [facility] Patient Information 2015 [name], [place]. This information is not intended to replace advice given to you by your health care provider. Make sure you discuss any questions you have with your  health care provider. Uterine Fibroid A uterine fibroid is a growth (tumor) that occurs in your uterus. This type of tumor is not cancerous and does not spread out of the uterus. You can have one or many fibroids. Fibroids can vary in size, weight, and where they grow in the uterus. Some can become quite large. Most fibroids do not require medical treatment, but some can cause pain or heavy bleeding during and between periods. CAUSES  A fibroid is the result of a single uterine cell that keeps growing (unregulated), which is different than most cells in the human body. Most cells have a control mechanism that keeps them from reproducing without control.  SIGNS AND SYMPTOMS  Bleeding. Pelvic pain and pressure. Bladder problems due to the size of the fibroid. Infertility and miscarriages depending on the size and location of the fibroid. DIAGNOSIS  Uterine fibroids are diagnosed through a physical exam. Your health care provider may feel the lumpy tumors during a pelvic exam. Ultrasonography may be done to get information regarding size, location, and number of tumors.  TREATMENT  Your health care provider may recommend watchful waiting. This involves getting the fibroid checked by your health care provider to see if it grows or shrinks.  Hormone treatment or an intrauterine device (IUD) may be prescribed.  Surgery may be needed to remove the fibroids (myomectomy) or the uterus (hysterectomy). This depends on your situation. When fibroids interfere with fertility and a woman wants to become pregnant, a health care provider may recommend having the fibroids removed.  HOME CARE INSTRUCTIONS  Home care depends on  how you were treated. In general:  Keep all follow-up appointments with your health care provider.  Only take over-the-counter or prescription medicines as directed by your health care provider. If you were prescribed a hormone treatment, take the hormone medicines exactly as directed. Do  not take aspirin. It can cause bleeding.  Talk to your health care provider about taking iron pills. If your periods are troublesome but not so heavy, lie down with your feet raised slightly above your heart. Place cold packs on your lower abdomen.  If your periods are heavy, write down the number of pads or tampons you use per month. Bring this information to your health care provider.  Include green vegetables in your diet.  SEEK IMMEDIATE MEDICAL CARE IF: You have pelvic pain or cramps not controlled with medicines.  You have a sudden increase in pelvic pain.  You have an increase in bleeding between and during periods.  You have excessive periods and soak tampons or pads in a half hour or less. You feel lightheaded or have fainting episodes. Document Released: 10/12/2000 Document Revised: 08/05/2013 Document Reviewed: 05/14/2013 [facility] Patient Information 2015 [place], [place]. This information is not intended to replace advice given to you by your health care provider. Make sure you discuss any questions you have with your health care provider.

## 2014-11-22 LAB — GC/CHLAMYDIA PROBE AMP (~~LOC~~) NOT AT ARMC
Chlamydia: NEGATIVE
NEISSERIA GONORRHEA: NEGATIVE

## 2014-12-14 ENCOUNTER — Telehealth: Payer: Self-pay

## 2014-12-14 NOTE — Telephone Encounter (Signed)
Pt LM stating that she is scheduled for an appt but she is having to change her pad every 20-30 minutes.  She was put on Megace which stopped the bleeding for a week and she is sometimes feeling faint.  Called pt and verified with patient if she is still taking the Megace.  Pt stated "no, I did not go pick up refill".  I strongly advised pt to go pick up refill and take the megace daily as she was instructed by the provider because that is what the provider has chosen to control your bleeding until her appt scheduled with the clinics.  Pt stated that she was going to get her refill and take tablet once a day.  I advised pt that she continues to saturate pads as such and she becomes pallor, severe fatigue to please go to [facility]U.  Pt agreed with no further questions.

## 2015-01-07 ENCOUNTER — Ambulatory Visit (INDEPENDENT_AMBULATORY_CARE_PROVIDER_SITE_OTHER): Payer: Self-pay | Admitting: Obstetrics & Gynecology

## 2015-01-07 VITALS — BP 126/70 | HR 66 | Ht 68.5 in | Wt 244.6 lb

## 2015-01-07 DIAGNOSIS — N920 Excessive and frequent menstruation with regular cycle: Secondary | ICD-10-CM | POA: Insufficient documentation

## 2015-01-07 LAB — POCT PREGNANCY, URINE: Preg Test, Ur: NEGATIVE

## 2015-01-07 MED ORDER — ETONOGESTREL-ETHINYL ESTRADIOL 0.12-0.015 MG/24HR VA RING: VAGINAL_RING | VAGINAL | Status: DC

## 2015-01-07 NOTE — Patient Instructions (Signed)

## 2015-01-07 NOTE — Progress Notes (Signed)
Patient ID: [name], female   DOB: [birth year], 39 y.o.   MRN: [id number]  Chief Complaint  Patient presents with  . Menorrhagia    HPI [name] is a 39 y.o. female.  Normal menses until 10/2014 when period lasted 2 weeks. Went to [facility]U and prescribed Megace which stopped the bleeding. Interested in hormonal management  HPI  Past Medical History  Diagnosis Date  . Asthma   . Anemia   . Obesity   . Abdominal pain   . Vomiting   . Diarrhea   . Shortness of breath     Past Surgical History  Procedure Laterality Date  . Cholecystectomy  06/19/11  . Tubal ligation      Family History  Problem Relation Age of Onset  . Diabetes Mother 85  . Emphysema Father 69    smoked  . Emphysema Maternal Aunt     smoked  . Lung cancer Maternal Aunt     smoked  . Bladder Cancer Mother   . Heart disease Maternal Grandfather   . Heart disease Maternal Aunt   . Heart disease Sister     Social History History  Substance Use Topics  . Smoking status: Never Smoker   . Smokeless tobacco: Never Used  . Alcohol Use: No    No Known Allergies  Current Outpatient Prescriptions  Medication Sig Dispense Refill  . albuterol (PROVENTIL HFA;VENTOLIN HFA) 108 (90 BASE) MCG/ACT inhaler Inhale 2 puffs into the lungs every 4 (four) hours as needed for wheezing. wheezing 1 Inhaler 0  . albuterol (PROVENTIL HFA;VENTOLIN HFA) 108 (90 BASE) MCG/ACT inhaler Inhale 1-2 puffs into the lungs every 6 (six) hours as needed for wheezing or shortness of breath. 1 Inhaler 0  . megestrol (MEGACE) 40 MG tablet Take 3 tablets (120 mg) per day for 3 days, take 2 tablets (80 mg) per day for 3 days, then take 1 tablet daily. 40 tablet 2  . mometasone-formoterol (DULERA) 100-5 MCG/ACT AERO Take 2 puffs first thing in am and then another 2 puffs about 12 hours later.    Marland Kitchen acetaminophen (TYLENOL) 500 MG tablet Take 1,000 mg by mouth every 6 (six) hours as needed. pain    . albuterol (PROVENTIL) (2.5 MG/3ML)  0.083% nebulizer solution Take 3 mL (2.5 mg total) by nebulization every 6 (six) hours as needed for wheezing or shortness of breath. wheezing 75 mL 0  . etonogestrel-ethinyl estradiol (NUVARING) 0.12-0.015 MG/24HR vaginal ring Insert vaginally and leave in place for 3 consecutive weeks, then remove for 1 week. 1 each 12  . famotidine (PEPCID) 20 MG tablet One at bedtime 30 tablet 2  . predniSONE (DELTASONE) 10 MG tablet Take 2 tablets (20 mg total) by mouth daily. 10 tablet 0   No current facility-administered medications for this visit.    Review of Systems Review of Systems  Constitutional: Negative.   Genitourinary: Positive for vaginal discharge. Negative for vaginal bleeding and pelvic pain.    Blood pressure 126/70, pulse 66, height 5' 8.5" (1.74 m), weight 244 lb 9.6 oz (110.95 kg).  Physical Exam Physical Exam  Constitutional: She is oriented to person, place, and time. She appears well-developed. No distress.  Pulmonary/Chest: Effort normal.  Genitourinary: Vagina normal and uterus normal. No vaginal discharge found.  No mass  Neurological: She is alert and oriented to person, place, and time.  Skin: Skin is warm and dry.  Psychiatric: She has a normal mood and affect. Her behavior is normal.  Data Reviewed CLINICAL DATA: Abnormal uterine bleeding.  EXAM: TRANSABDOMINAL AND TRANSVAGINAL ULTRASOUND OF PELVIS  TECHNIQUE: Both transabdominal and transvaginal ultrasound examinations of the pelvis were performed. Transabdominal technique was performed for global imaging of the pelvis including uterus, ovaries, adnexal regions, and pelvic cul-de-sac. It was necessary to proceed with endovaginal exam following the transabdominal exam to visualize the uterus and endometrium.  COMPARISON: None  FINDINGS: Uterus  Measurements: 8.7 x 4.9 x 5.7 cm. There are several intramural fibroids identified. The largest is in the anterior myometrium measuring 1.8 x 1.4 x  1.9 cm. Within the uterine fundus there is a 1.5 x 1.3 x 1.7 cm intramural fibroid.  Endometrium  Thickness: 4 mm. No focal abnormality visualized.  Right ovary  Measurements: 4.2 x 1.8 x 2.4 cm. Normal appearance/no adnexal mass.  Left ovary  Measurements: 3.4 x 2.0 x 2.2 cm. Normal appearance/no adnexal mass.  Other findings  Small amount of free fluid noted.  IMPRESSION: 1. No findings to explain abnormal uterine bleeding. If bleeding remains unresponsive to hormonal or medical therapy, sonohysterogram should be considered for focal lesion work-up. (Ref: Radiological Reasoning: Algorithmic Workup of Abnormal Vaginal Bleeding with Endovaginal Sonography and Sonohysterography. AJR 2008; 628:M38-17) 2. Small uterine fibroids.   Electronically Signed  By: Kerby Moors M.D.  On: 11/20/2014 15:46  Assessment    Menometrorrhagia, normal endometrial stripe     Plan    Megace until can fill Rx Nuvaring, RTC for annual 3 mo        Gwynne Kemnitz 01/07/2015, 11:51 AM

## 2015-09-20 ENCOUNTER — Emergency Department (HOSPITAL_COMMUNITY)
Admission: EM | Admit: 2015-09-20 | Discharge: 2015-09-20 | Disposition: A | Discharge status: Home / Self Care | Payer: Self-pay | Attending: Emergency Medicine | Admitting: Emergency Medicine

## 2015-09-20 ENCOUNTER — Emergency Department (HOSPITAL_COMMUNITY): Payer: Self-pay

## 2015-09-20 ENCOUNTER — Encounter (HOSPITAL_COMMUNITY): Payer: Self-pay | Admitting: Emergency Medicine

## 2015-09-20 DIAGNOSIS — E669 Obesity, unspecified: Secondary | ICD-10-CM | POA: Insufficient documentation

## 2015-09-20 DIAGNOSIS — Z79899 Other long term (current) drug therapy: Secondary | ICD-10-CM | POA: Insufficient documentation

## 2015-09-20 DIAGNOSIS — J45901 Unspecified asthma with (acute) exacerbation: Secondary | ICD-10-CM | POA: Insufficient documentation

## 2015-09-20 DIAGNOSIS — Z7952 Long term (current) use of systemic steroids: Secondary | ICD-10-CM | POA: Insufficient documentation

## 2015-09-20 DIAGNOSIS — Z862 Personal history of diseases of the blood and blood-forming organs and certain disorders involving the immune mechanism: Secondary | ICD-10-CM | POA: Insufficient documentation

## 2015-09-20 DIAGNOSIS — Z792 Long term (current) use of antibiotics: Secondary | ICD-10-CM | POA: Insufficient documentation

## 2015-09-20 MED ORDER — IPRATROPIUM-ALBUTEROL 0.5-2.5 (3) MG/3ML IN SOLN
RESPIRATORY_TRACT | Status: AC
  Administered 2015-09-20: 1 mL
  Filled 2015-09-20: qty 3

## 2015-09-20 MED ORDER — DEXAMETHASONE SODIUM PHOSPHATE 10 MG/ML IJ SOLN
10.0000 mg | Freq: Once | INTRAMUSCULAR | Status: AC
  Administered 2015-09-20: 10 mg via INTRAMUSCULAR
  Filled 2015-09-20: qty 1

## 2015-09-20 MED ORDER — ALBUTEROL SULFATE (2.5 MG/3ML) 0.083% IN NEBU
5.0000 mg | INHALATION_SOLUTION | Freq: Once | RESPIRATORY_TRACT | Status: AC
  Administered 2015-09-20: 5 mg via RESPIRATORY_TRACT
  Filled 2015-09-20: qty 6

## 2015-09-20 MED ORDER — HYDROCODONE-HOMATROPINE 5-1.5 MG/5ML PO SYRP: 5.0000 mL | ORAL_SOLUTION | Freq: Four times a day (QID) | ORAL | Status: DC | PRN

## 2015-09-20 MED ORDER — DOXYCYCLINE HYCLATE 100 MG PO CAPS: 100.0000 mg | ORAL_CAPSULE | Freq: Two times a day (BID) | ORAL | Status: DC

## 2015-09-20 MED ORDER — ONDANSETRON 4 MG PO TBDP
4.0000 mg | ORAL_TABLET | Freq: Once | ORAL | Status: AC | PRN
  Administered 2015-09-20: 4 mg via ORAL

## 2015-09-20 MED ORDER — ONDANSETRON 4 MG PO TBDP
ORAL_TABLET | ORAL | Status: AC
  Filled 2015-09-20: qty 1

## 2015-09-20 NOTE — Discharge Instructions (Signed)

## 2015-09-20 NOTE — ED Notes (Signed)
Pt states she has been having chills and non-productive cough for 1 week with generalized body aches. 1 hr ago pt started having wheezing with shortness of breath. Pt used home inhaler with no relief.

## 2015-09-20 NOTE — ED Notes (Signed)
Gave patient a sprite  Request from her nurse Jarrett Soho

## 2015-09-20 NOTE — ED Provider Notes (Signed)
CSN: [id number]     Arrival date & time 09/20/15  1356 History   First MD Initiated Contact with Patient 09/20/15 1455     Chief Complaint  Patient presents with  . Shortness of Breath     HPI Pt states she has been having chills and non-productive cough for 1 week with generalized body aches. 1 hr ago pt started having wheezing with shortness of breath. Pt used home inhaler with no relief Past Medical History  Diagnosis Date  . Asthma   . Anemia   . Obesity   . Abdominal pain   . Vomiting   . Diarrhea   . Shortness of breath    Past Surgical History  Procedure Laterality Date  . Cholecystectomy  06/19/11  . Tubal ligation     Family History  Problem Relation Age of Onset  . Diabetes Mother 32  . Emphysema Father 33    smoked  . Emphysema Maternal Aunt     smoked  . Lung cancer Maternal Aunt     smoked  . Bladder Cancer Mother   . Heart disease Maternal Grandfather   . Heart disease Maternal Aunt   . Heart disease Sister    Social History  Substance Use Topics  . Smoking status: Never Smoker   . Smokeless tobacco: Never Used  . Alcohol Use: No   OB History    Gravida Para Term Preterm AB TAB SAB Ectopic Multiple Living   5 4 3 1 1  1   4      Review of Systems  Constitutional: Negative for fever and chills.  All other systems reviewed and are negative.     Allergies  Review of patient's allergies indicates no known allergies.  Home Medications   Prior to Admission medications   Medication Sig Start Date End Date Taking? Authorizing Provider  acetaminophen (TYLENOL) 500 MG tablet Take 1,000 mg by mouth every 6 (six) hours as needed for mild pain, moderate pain or headache. pain   Yes Historical Provider, MD  albuterol (PROVENTIL HFA;VENTOLIN HFA) 108 (90 BASE) MCG/ACT inhaler Inhale 1-2 puffs into the lungs every 6 (six) hours as needed for wheezing or shortness of breath. 03/16/14  Yes Peter Dammen, PA-C  albuterol (PROVENTIL) (2.5 MG/3ML) 0.083%  nebulizer solution Take 3 mL (2.5 mg total) by nebulization every 6 (six) hours as needed for wheezing or shortness of breath. wheezing 06/12/13  Yes Shanker Kristeen Mans, MD  famotidine (PEPCID) 20 MG tablet One at bedtime Patient taking differently: Take 20 mg by mouth at bedtime.  09/03/13  Yes Tanda Rockers, MD  mometasone-formoterol [facility]) 100-5 MCG/ACT AERO Take 2 puffs first thing in am and then another 2 puffs about 12 hours later. 07/30/13  Yes Tanda Rockers, MD  albuterol (PROVENTIL HFA;VENTOLIN HFA) 108 (90 BASE) MCG/ACT inhaler Inhale 2 puffs into the lungs every 4 (four) hours as needed for wheezing. wheezing Patient not taking: Reported on 09/20/2015 06/23/13   Reyne Dumas, MD  doxycycline (VIBRAMYCIN) 100 MG capsule Take 1 capsule (100 mg total) by mouth 2 (two) times daily. 09/20/15   Leonard Schwartz, MD  etonogestrel-ethinyl estradiol (NUVARING) 0.12-0.015 MG/24HR vaginal ring Insert vaginally and leave in place for 3 consecutive weeks, then remove for 1 week. Patient not taking: Reported on 09/20/2015 01/07/15   Woodroe Mode, MD  HYDROcodone-homatropine [facility]) 5-1.5 MG/5ML syrup Take 5 mLs by mouth every 6 (six) hours as needed for cough. 09/20/15   Leonard Schwartz, MD  megestrol (  MEGACE) 40 MG tablet Take 3 tablets (120 mg) per day for 3 days, take 2 tablets (80 mg) per day for 3 days, then take 1 tablet daily. Patient not taking: Reported on 09/20/2015 11/20/14   Lattie Haw A Leftwich-Kirby, CNM  predniSONE (DELTASONE) 10 MG tablet Take 2 tablets (20 mg total) by mouth daily. Patient not taking: Reported on 09/20/2015 03/16/14   Hazel Sams, PA-C   BP 134/73 mmHg  Pulse 108  Resp 20  Ht 5\' 5"  (1.651 m)  Wt 244 lb (110.678 kg)  BMI 40.60 kg/m2  SpO2 93%  LMP 09/20/2015 Physical Exam  Constitutional: She is oriented to person, place, and time. She appears well-developed and well-nourished. No distress.  HENT:  Head: Normocephalic and atraumatic.  Eyes: Pupils are equal, round, and  reactive to light.  Neck: Normal range of motion.  Cardiovascular: Normal rate and intact distal pulses.   Pulmonary/Chest: No respiratory distress. She has wheezes.  Abdominal: Normal appearance. She exhibits no distension.  Musculoskeletal: Normal range of motion.  Neurological: She is alert and oriented to person, place, and time. No cranial nerve deficit.  Skin: Skin is warm and dry. No rash noted.  Psychiatric: She has a normal mood and affect. Her behavior is normal.  Nursing note and vitals reviewed.   ED Course  Procedures (including critical care time) Medications  ipratropium-albuterol (DUONEB) 0.5-2.5 (3) MG/3ML nebulizer solution (1 mL  Given 09/20/15 1402)  ondansetron (ZOFRAN-ODT) disintegrating tablet 4 mg (4 mg Oral Given 09/20/15 1439)  albuterol (PROVENTIL) (2.5 MG/3ML) 0.083% nebulizer solution 5 mg (5 mg Nebulization Given 09/20/15 1507)  dexamethasone (DECADRON) injection 10 mg (10 mg Intramuscular Given 09/20/15 1508)  albuterol (PROVENTIL) (2.5 MG/3ML) 0.083% nebulizer solution 5 mg (5 mg Nebulization Given 09/20/15 1909)    Labs Review Labs Reviewed - No data to display  Imaging Review Dg Chest 2 View  09/20/2015  CLINICAL DATA:  Chest pain, shortness of Breath EXAM: CHEST  2 VIEW COMPARISON:  03/15/2014 FINDINGS: Cardiomediastinal silhouette is stable. Study is limited by patient's large body habitus. No acute infiltrate or pulmonary edema. Bilateral basilar atelectasis again noted. Bony thorax is unremarkable. IMPRESSION: Limited study by patient's large body habitus. No acute infiltrate or pulmonary edema. Bony thorax is stable. Again noted bilateral basilar atelectasis. Electronically Signed   By: Lahoma Crocker M.D.   On: 09/20/2015 16:00   I have personally reviewed and evaluated these images and lab results as part of my medical decision-making.   EKG Interpretation   Date/Time:  Tuesday September 20 2015 14:09:52 EST Ventricular Rate:  109 PR Interval:   128 QRS Duration: 68 QT Interval:  350 QTC Calculation: 471 R Axis:   47 Text Interpretation:  Sinus tachycardia Right atrial enlargement  Borderline ECG Abnormal ekg Confirmed by Josemaria Brining  MD, Ivan Maskell ([id number]) on  09/20/2015 2:57:21 PM     After treatment in the ED the patient feels better and wants to go home. MDM   Final diagnoses:  Asthmatic bronchitis, unspecified asthma severity, with acute exacerbation        Leonard Schwartz, MD 09/20/15 2014

## 2016-07-02 ENCOUNTER — Emergency Department (HOSPITAL_COMMUNITY): Payer: Self-pay

## 2016-07-02 ENCOUNTER — Encounter (HOSPITAL_COMMUNITY): Payer: Self-pay | Admitting: Emergency Medicine

## 2016-07-02 ENCOUNTER — Emergency Department (HOSPITAL_COMMUNITY)
Admission: EM | Admit: 2016-07-02 | Discharge: 2016-07-02 | Disposition: A | Discharge status: Home / Self Care | Payer: Self-pay | Attending: Emergency Medicine | Admitting: Emergency Medicine

## 2016-07-02 DIAGNOSIS — Z79899 Other long term (current) drug therapy: Secondary | ICD-10-CM | POA: Insufficient documentation

## 2016-07-02 DIAGNOSIS — J45909 Unspecified asthma, uncomplicated: Secondary | ICD-10-CM | POA: Insufficient documentation

## 2016-07-02 DIAGNOSIS — M79671 Pain in right foot: Secondary | ICD-10-CM | POA: Insufficient documentation

## 2016-07-02 MED ORDER — INDOMETHACIN 25 MG PO CAPS: 25.0000 mg | ORAL_CAPSULE | Freq: Three times a day (TID) | ORAL | 0 refills | Status: DC | PRN

## 2016-07-02 MED ORDER — HYDROCODONE-ACETAMINOPHEN 5-325 MG PO TABS
1.0000 | ORAL_TABLET | Freq: Once | ORAL | Status: AC
  Administered 2016-07-02: 1 via ORAL
  Filled 2016-07-02: qty 1

## 2016-07-02 MED ORDER — HYDROCODONE-ACETAMINOPHEN 5-325 MG PO TABS: 1.0000 | ORAL_TABLET | Freq: Four times a day (QID) | ORAL | 0 refills | Status: DC | PRN

## 2016-07-02 MED ORDER — KETOROLAC TROMETHAMINE 60 MG/2ML IM SOLN
30.0000 mg | Freq: Once | INTRAMUSCULAR | Status: AC
  Administered 2016-07-02: 30 mg via INTRAMUSCULAR
  Filled 2016-07-02: qty 2

## 2016-07-02 NOTE — Discharge Instructions (Signed)
Your x-ray today is shows no broken bones or dislocations. We are treating you for possible gout. Follow up with your primary care doctor or with Dr. Ninfa Linden. Return here as needed.

## 2016-07-02 NOTE — ED Provider Notes (Signed)
[place] DEPT Provider Note   CSN: [id number] Arrival date & time: 07/02/16  1446   By signing my name below, I, Estanislado Pandy, attest that this documentation has been prepared under the direction and in the presence of [facility] M. Nesse, NP. Electronically Signed: Estanislado Pandy, Scribe. 07/02/2016. 4:20 PM.   History   Chief Complaint Chief Complaint  Patient presents with  . Foot Pain    The history is provided by the patient. No language interpreter was used.  Foot Pain  This is a new problem. Episode onset: 3 days ago. The problem occurs constantly. The problem has been gradually worsening. Nothing relieves the symptoms. Treatments tried: ice, elevation, naproxen, tramadol. The treatment provided no relief.  HPI Comments:  [name] is a 40 y.o. female with PMHx of asthma who presents to the Emergency Department complaining of sudden onset, worsening R foot pain x3 days. Pt reports that pain first began in the center of the arch of her foot, and then radiated to the top of her R foot and now radiates down the L side of her R foot. Pt complains of associated swelling and describe the pain as 10/10. Pt used ice/elevation and took naproxen and tramadol with no relief. Pt denies nausea, vomiting, fever, chills, previous R foot injury, and PMHx of gout.   Past Medical History:  Diagnosis Date  . Abdominal pain   . Anemia   . Asthma   . Diarrhea   . Obesity   . Shortness of breath   . Vomiting     Patient Active Problem List   Diagnosis Date Noted  . Menorrhagia with regular cycle 01/07/2015  . Chronic asthma 07/31/2013  . Acute respiratory failure ([place]) 06/09/2013  . SOB (shortness of breath) 06/09/2013  . Chest pain, pleuritic 06/09/2013  . Asthma with acute exacerbation 01/01/2012  . Anemia 01/01/2012  . Hypokalemia 01/01/2012  . Gallstone pancreatitis 08/02/2011    Past Surgical History:  Procedure Laterality Date  . CHOLECYSTECTOMY  06/19/11  . TUBAL  LIGATION      OB History    Gravida Para Term Preterm AB Living   5 4 3 1 1 4    SAB TAB Ectopic Multiple Live Births   1       4       Home Medications    Prior to Admission medications   Medication Sig Start Date End Date Taking? Authorizing Provider  acetaminophen (TYLENOL) 500 MG tablet Take 1,000 mg by mouth every 6 (six) hours as needed for mild pain, moderate pain or headache. pain    Historical Provider, MD  albuterol (PROVENTIL HFA;VENTOLIN HFA) 108 (90 BASE) MCG/ACT inhaler Inhale 2 puffs into the lungs every 4 (four) hours as needed for wheezing. wheezing Patient not taking: Reported on 09/20/2015 06/23/13   Reyne Dumas, MD  albuterol (PROVENTIL HFA;VENTOLIN HFA) 108 (90 BASE) MCG/ACT inhaler Inhale 1-2 puffs into the lungs every 6 (six) hours as needed for wheezing or shortness of breath. 03/16/14   Hazel Sams, PA-C  albuterol (PROVENTIL) (2.5 MG/3ML) 0.083% nebulizer solution Take 3 mL (2.5 mg total) by nebulization every 6 (six) hours as needed for wheezing or shortness of breath. wheezing 06/12/13   Shanker Kristeen Mans, MD  doxycycline (VIBRAMYCIN) 100 MG capsule Take 1 capsule (100 mg total) by mouth 2 (two) times daily. 09/20/15   Leonard Schwartz, MD  etonogestrel-ethinyl estradiol (NUVARING) 0.12-0.015 MG/24HR vaginal ring Insert vaginally and leave in place for 3 consecutive weeks, then remove  for 1 week. Patient not taking: Reported on 09/20/2015 01/07/15   Woodroe Mode, MD  famotidine (PEPCID) 20 MG tablet One at bedtime Patient taking differently: Take 20 mg by mouth at bedtime.  09/03/13   Tanda Rockers, MD  HYDROcodone-acetaminophen (NORCO) 5-325 MG tablet Take 1 tablet by mouth every 6 (six) hours as needed. 07/02/16   [place], NP  HYDROcodone-homatropine (HYCODAN) 5-1.5 MG/5ML syrup Take 5 mLs by mouth every 6 (six) hours as needed for cough. 09/20/15   Leonard Schwartz, MD  indomethacin (INDOCIN) 25 MG capsule Take 1 capsule (25 mg total) by mouth 3 (three) times  daily as needed. 07/02/16   Audrea Bolte Bunnie Pion, NP  megestrol (MEGACE) 40 MG tablet Take 3 tablets (120 mg) per day for 3 days, take 2 tablets (80 mg) per day for 3 days, then take 1 tablet daily. Patient not taking: Reported on 09/20/2015 11/20/14   Kathie Dike Leftwich-Kirby, CNM  mometasone-formoterol (DULERA) 100-5 MCG/ACT AERO Take 2 puffs first thing in am and then another 2 puffs about 12 hours later. 07/30/13   Tanda Rockers, MD  predniSONE (DELTASONE) 10 MG tablet Take 2 tablets (20 mg total) by mouth daily. Patient not taking: Reported on 09/20/2015 03/16/14   Hazel Sams, PA-C    Family History Family History  Problem Relation Age of Onset  . Diabetes Mother 65  . Bladder Cancer Mother   . Emphysema Father 46    smoked  . Emphysema Maternal Aunt     smoked  . Lung cancer Maternal Aunt     smoked  . Heart disease Maternal Grandfather   . Heart disease Maternal Aunt   . Heart disease Sister     Social History Social History  Substance Use Topics  . Smoking status: Never Smoker  . Smokeless tobacco: Never Used  . Alcohol use No     Allergies   Review of patient's allergies indicates no known allergies.   Review of Systems Review of Systems  Constitutional: Negative for chills and fever.  Gastrointestinal: Negative for nausea and vomiting.  Musculoskeletal: Positive for arthralgias and joint swelling.  All other systems reviewed and are negative.    Physical Exam Updated Vital Signs BP 137/85 (BP Location: Right Arm)   Pulse 71   Temp 98.7 F (37.1 C) (Oral)   Resp 20   LMP 06/25/2016 (Approximate)   SpO2 100%   Physical Exam  Constitutional: She appears well-developed and well-nourished. No distress.  HENT:  Head: Normocephalic and atraumatic.  Eyes: Conjunctivae are normal.  Cardiovascular: Normal rate and intact distal pulses.   Adequate circulation  Pulmonary/Chest: Effort normal.  Abdominal: She exhibits no distension.  Musculoskeletal:  Tenderness with  palpation to dorsum of R foot to big toe. Pain extends to the arch of the R foot.   Neurological: She is alert.  Skin: Skin is warm and dry.  Psychiatric: She has a normal mood and affect.  Nursing note and vitals reviewed.    ED Treatments / Results  DIAGNOSTIC STUDIES:  Oxygen Saturation is 99% on RA, normal by my interpretation.    COORDINATION OF CARE:  4:20 PM Will order x-ray of R foot. Discussed treatment plan with pt at bedside and pt agreed to plan.  Labs (all labs ordered are listed, but only abnormal results are displayed) Labs Reviewed - No data to display  Radiology Dg Foot Complete Right  Result Date: 07/02/2016 CLINICAL DATA:  Right pain for 3 days.  No injury. EXAM: RIGHT FOOT COMPLETE - 3+ VIEW COMPARISON:  None. FINDINGS: No fracture or dislocation. Suggested soft tissue swelling along the top of the foot. IMPRESSION: There appears to be soft tissue swelling along the top of the foot. No underlying fracture or dislocation. Electronically Signed   By: Dorise Bullion III M.D   On: 07/02/2016 16:50    Procedures Procedures (including critical care time)  Medications Ordered in ED Medications  HYDROcodone-acetaminophen (NORCO/VICODIN) 5-325 MG per tablet 1 tablet (1 tablet Oral Given 07/02/16 1724)  ketorolac (TORADOL) injection 30 mg (30 mg Intramuscular Given 07/02/16 1724)     Initial Impression / Assessment and Plan / ED Course  I have reviewed the triage vital signs and the nursing notes.  Pertinent imaging results that were available during my care of the patient were reviewed by me and considered in my medical decision making (see chart for details).  Clinical Course    Final Clinical Impressions(s) / ED Diagnoses  40 y.o. female with right foot pain with possible gout stable for d/c without focal neuro deficits. Will treat with NSAIDS and hydrocodone. She will f/u with ortho or return here for worsening symptoms   Final diagnoses:  Foot pain, right      New Prescriptions Discharge Medication List as of 07/02/2016  5:40 PM    START taking these medications   Details  HYDROcodone-acetaminophen (NORCO) 5-325 MG tablet Take 1 tablet by mouth every 6 (six) hours as needed., Starting Mon 07/02/2016, Print    indomethacin (INDOCIN) 25 MG capsule Take 1 capsule (25 mg total) by mouth 3 (three) times daily as needed., Starting Mon 07/02/2016, Print      I personally performed the services described in this documentation, which was scribed in my presence. The recorded information has been reviewed and is accurate.    [address] [place], [place] 07/04/16 [place], MD 07/04/16 [phone]

## 2016-07-02 NOTE — ED Triage Notes (Signed)
Patient complains of 3 days of right foot pain. desnies trauma, no swelling, no deformity

## 2016-11-18 IMAGING — CR DG FOOT COMPLETE 3+V*R*
3 series · 3 of 3 positions shown · non-contrast
Comparison: None.

CLINICAL DATA: Right pain for 3 days.  No injury.

EXAM:
RIGHT FOOT COMPLETE - 3+ VIEW

[foot ap]
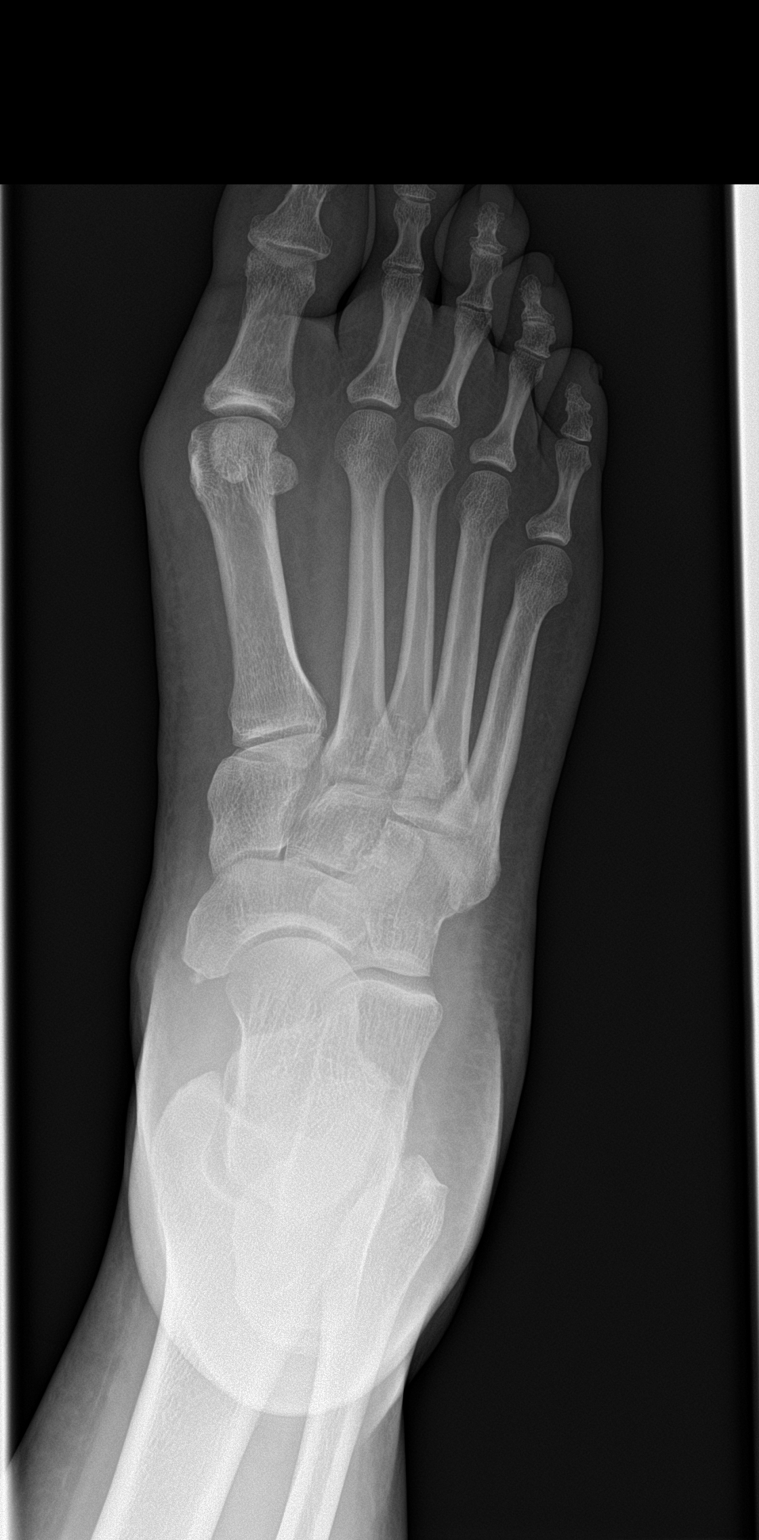

[foot obl]
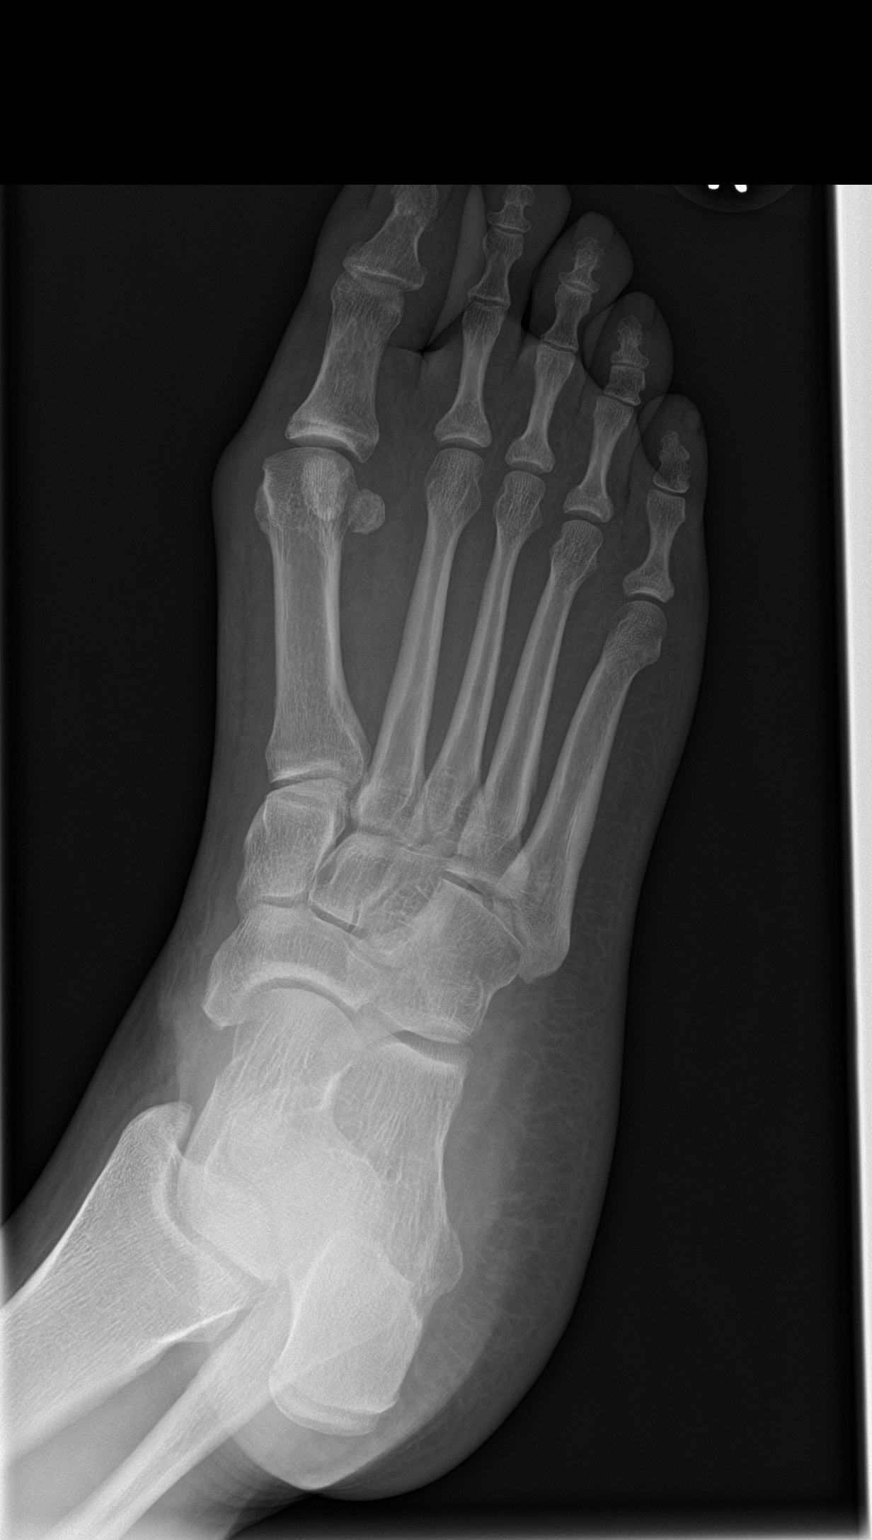

[foot lat]
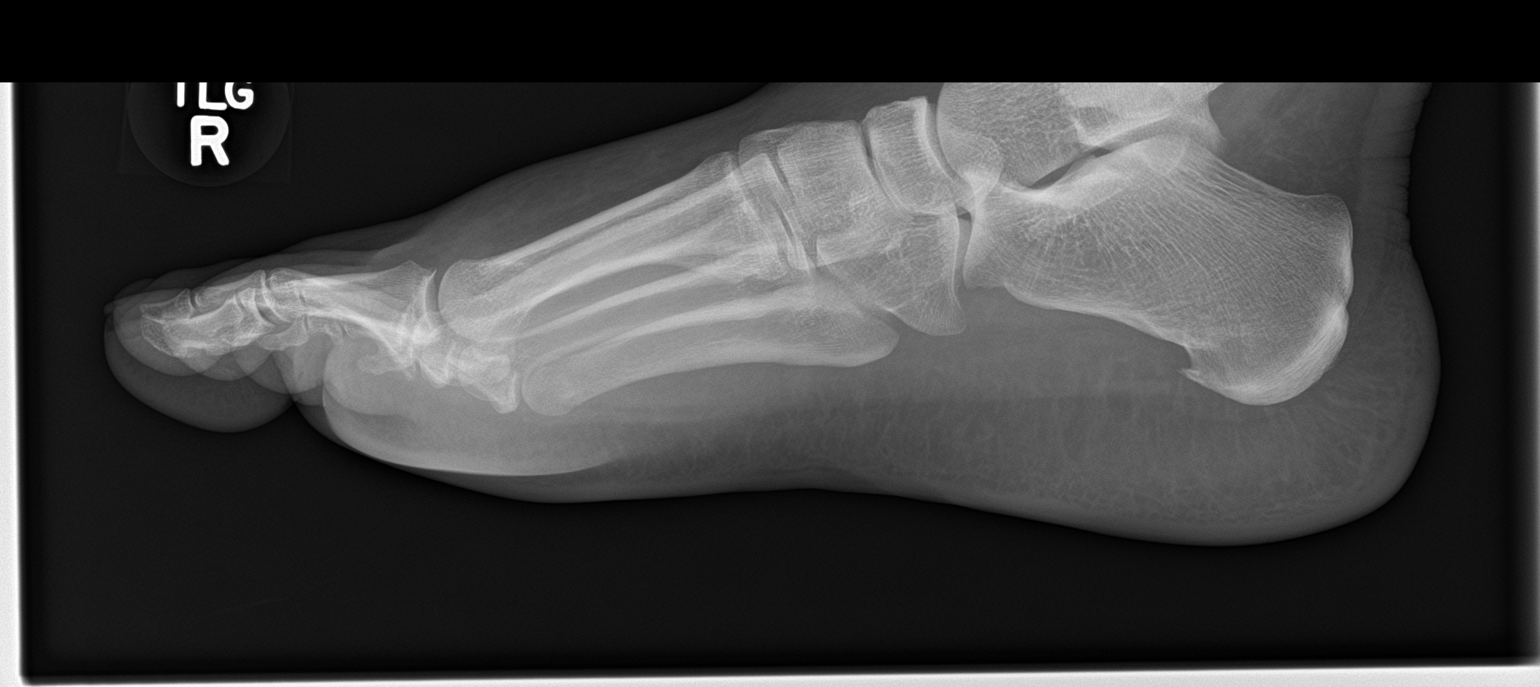

[3 of 3 positions shown; findings below may reference images not displayed]

FINDINGS: No fracture or dislocation. Suggested soft tissue swelling along the
top of the foot.
IMPRESSION: There appears to be soft tissue swelling along the top of the foot.
No underlying fracture or dislocation.

## 2016-11-29 ENCOUNTER — Encounter: Payer: Self-pay | Admitting: Obstetrics & Gynecology

## 2016-11-29 ENCOUNTER — Ambulatory Visit (INDEPENDENT_AMBULATORY_CARE_PROVIDER_SITE_OTHER): Payer: BLUE CROSS/BLUE SHIELD | Admitting: Obstetrics & Gynecology

## 2016-11-29 VITALS — BP 121/58 | HR 79 | Ht 70.0 in | Wt 249.2 lb

## 2016-11-29 DIAGNOSIS — Z3202 Encounter for pregnancy test, result negative: Secondary | ICD-10-CM

## 2016-11-29 DIAGNOSIS — Z1151 Encounter for screening for human papillomavirus (HPV): Secondary | ICD-10-CM | POA: Diagnosis not present

## 2016-11-29 DIAGNOSIS — N924 Excessive bleeding in the premenopausal period: Secondary | ICD-10-CM | POA: Diagnosis not present

## 2016-11-29 DIAGNOSIS — Z124 Encounter for screening for malignant neoplasm of cervix: Secondary | ICD-10-CM

## 2016-11-29 LAB — POCT PREGNANCY, URINE: Preg Test, Ur: NEGATIVE

## 2016-11-29 LAB — CBC
HCT: 30.5 % — ABNORMAL LOW (ref 35.0–45.0)
Hemoglobin: 9.3 g/dL — ABNORMAL LOW (ref 11.7–15.5)
MCH: 23 pg — AB (ref 27.0–33.0)
MCHC: 30.5 g/dL — ABNORMAL LOW (ref 32.0–36.0)
MCV: 75.3 fL — ABNORMAL LOW (ref 80.0–100.0)
PLATELETS: 221 10*3/uL (ref 140–400)
RBC: 4.05 MIL/uL (ref 3.80–5.10)
RDW: 14.4 % (ref 11.0–15.0)
WBC: 6.5 10*3/uL (ref 3.8–10.8)

## 2016-11-29 MED ORDER — MEDROXYPROGESTERONE ACETATE 10 MG PO TABS: 20.0000 mg | ORAL_TABLET | Freq: Every day | ORAL | 2 refills | Status: DC

## 2016-11-29 NOTE — Progress Notes (Signed)
Patient ID: [name], female   DOB: 20-Jul-1976, 41 y.o.   MRN: [id number]  Chief Complaint  Patient presents with  . Menorrhagia    HPI [name] is a 41 y.o. female.  [id number] Patient's last menstrual period was 09/09/2016. S/P BTL 5 years ago. Daily vaginal bleeding since LMP. On no hormone therapy.  HPI  Past Medical History:  Diagnosis Date  . Abdominal pain   . Anemia   . Asthma   . Diarrhea   . Obesity   . Shortness of breath   . Vomiting     Past Surgical History:  Procedure Laterality Date  . CHOLECYSTECTOMY  06/19/11  . TUBAL LIGATION      Family History  Problem Relation Age of Onset  . Diabetes Mother 64  . Bladder Cancer Mother   . Emphysema Father 62    smoked  . Emphysema Maternal Aunt     smoked  . Lung cancer Maternal Aunt     smoked  . Heart disease Maternal Grandfather   . Heart disease Maternal Aunt   . Heart disease Sister     Social History Social History  Substance Use Topics  . Smoking status: Never Smoker  . Smokeless tobacco: Never Used  . Alcohol use No    No Known Allergies  Current Outpatient Prescriptions  Medication Sig Dispense Refill  . acetaminophen (TYLENOL) 500 MG tablet Take 1,000 mg by mouth every 6 (six) hours as needed for mild pain, moderate pain or headache. pain    . albuterol (PROVENTIL HFA;VENTOLIN HFA) 108 (90 BASE) MCG/ACT inhaler Inhale 2 puffs into the lungs every 4 (four) hours as needed for wheezing. wheezing 1 Inhaler 0  . albuterol (PROVENTIL) (2.5 MG/3ML) 0.083% nebulizer solution Take 3 mL (2.5 mg total) by nebulization every 6 (six) hours as needed for wheezing or shortness of breath. wheezing 75 mL 0  . famotidine (PEPCID) 20 MG tablet One at bedtime (Patient taking differently: Take 20 mg by mouth at bedtime. ) 30 tablet 2  . HYDROcodone-acetaminophen (NORCO) 5-325 MG tablet Take 1 tablet by mouth every 6 (six) hours as needed. 15 tablet 0  . indomethacin (INDOCIN) 25 MG capsule Take 1  capsule (25 mg total) by mouth 3 (three) times daily as needed. 30 capsule 0  . mometasone-formoterol (DULERA) 100-5 MCG/ACT AERO Take 2 puffs first thing in am and then another 2 puffs about 12 hours later.     No current facility-administered medications for this visit.     Review of Systems Review of Systems  Constitutional: Negative.   Genitourinary: Positive for pelvic pain (cramps) and vaginal bleeding.  Skin: Negative.   Neurological: Negative.     Blood pressure (!) 121/58, pulse 79, height 5\' 10"  (1.778 m), weight 249 lb 3.2 oz (113 kg), last menstrual period 09/09/2016.  Physical Exam Physical Exam  Constitutional: She appears well-developed. No distress.  Genitourinary: Vagina normal and uterus normal.  Genitourinary Comments: Pap done , blood in vault  Skin: Skin is warm and dry. No pallor.    Data Reviewed CBC    Component Value Date/Time   WBC 7.3 11/20/2014 1459   RBC 4.66 11/20/2014 1459   HGB 11.0 (L) 11/20/2014 1459   HCT 35.4 (L) 11/20/2014 1459   PLT 185 11/20/2014 1459   MCV 76.0 (L) 11/20/2014 1459   MCH 23.6 (L) 11/20/2014 1459   MCHC 31.1 11/20/2014 1459   RDW 13.5 11/20/2014 1459   LYMPHSABS 1.7 03/15/2014 2302  MONOABS 0.2 03/15/2014 2302   EOSABS 0.1 03/15/2014 2302   BASOSABS 0.0 03/15/2014 2302     Assessment    CLINICAL DATA:  Abnormal uterine bleeding.  EXAM: TRANSABDOMINAL AND TRANSVAGINAL ULTRASOUND OF PELVIS  TECHNIQUE: Both transabdominal and transvaginal ultrasound examinations of the pelvis were performed. Transabdominal technique was performed for global imaging of the pelvis including uterus, ovaries, adnexal regions, and pelvic cul-de-sac. It was necessary to proceed with endovaginal exam following the transabdominal exam to visualize the uterus and endometrium.  COMPARISON:  None  FINDINGS: Uterus  Measurements: 8.7 x 4.9 x 5.7 cm. There are several intramural fibroids identified. The largest is in the  anterior myometrium measuring 1.8 x 1.4 x 1.9 cm. Within the uterine fundus there is a 1.5 x 1.3 x 1.7 cm intramural fibroid.  Endometrium  Thickness: 4 mm.  No focal abnormality visualized.  Right ovary  Measurements: 4.2 x 1.8 x 2.4 cm. Normal appearance/no adnexal mass.  Left ovary  Measurements: 3.4 x 2.0 x 2.2 cm. Normal appearance/no adnexal mass.  Other findings  Small amount of free fluid noted.  IMPRESSION: 1. No findings to explain abnormal uterine bleeding. If bleeding remains unresponsive to hormonal or medical therapy, sonohysterogram should be considered for focal lesion work-up. (Ref: Radiological Reasoning: Algorithmic Workup of Abnormal Vaginal Bleeding with Endovaginal Sonography and Sonohysterography. AJR 2008; [id number]) 2. Small uterine fibroids.   Electronically Signed   By: Kerby Moors M.D.   On: 11/20/2014 15:46 DUB, small fibroids on [place] 2 years ago    Plan    Repeat US Provera 20 mg daily RTC after [place]. Discussed Mirena or endometrial ablation       [name] 11/29/2016, 4:07 PM

## 2016-11-29 NOTE — Patient Instructions (Signed)
Endometrial Ablation Endometrial ablation removes the lining of the uterus (endometrium). It is usually a same-day, outpatient treatment. Ablation helps avoid major surgery, such as surgery to remove the cervix and uterus (hysterectomy). After endometrial ablation, you will have little or no menstrual bleeding and may not be able to have children. However, if you are premenopausal, you will need to use a reliable method of birth control following the procedure because of the small chance that pregnancy can occur. There are different reasons to have this procedure. These reasons include:  Heavy periods.  Bleeding that is causing anemia.  Irregular bleeding.  Bleeding fibroids on the lining inside the uterus if they are smaller than 3 centimeters. This procedure may not be possible for you if:   You want to have children in the future.   You have severe cramps with your menstrual period.   You have precancerous or cancerous cells in your uterus.   You were recently pregnant.   You have gone through menopause.   You have had major surgery on your uterus, resulting in thinning of the uterine wall. Surgeries may include:  The removal of one or more uterine fibroids (myomectomy).  A cesarean section with a classic (vertical) incision on your uterus. Ask your health care provider what type of cesarean you had. Sometimes the scar on your skin is different than the scar on your uterus. Even if you have had surgery on your uterus, certain types of ablation may still be safe for you. Talk with your health care provider. LET YOUR HEALTH CARE PROVIDER KNOW ABOUT:  Any allergies you have.  All medicines you are taking, including vitamins, herbs, eye drops, creams, and over-the-counter medicines.  Previous problems you or members of your family have had with the use of anesthetics.  Any blood disorders you have.  Previous surgeries you have had.  Medical conditions you have. RISKS AND  COMPLICATIONS  Generally, this is a safe procedure. However, as with any procedure, complications can occur. Possible complications include:  Perforation of the uterus.  Bleeding.  Infection of the uterus, bladder, or vagina.  Injury to surrounding organs.  An air bubble to the lung (air embolus).  Pregnancy following the procedure.  Failure of the procedure to help the problem, requiring hysterectomy.  Decreased ability to diagnose cancer in the lining of the uterus. BEFORE THE PROCEDURE  The lining of the uterus must be tested to make sure there is no pre-cancerous or cancer cells present.  An ultrasound may be performed to look at the size of the uterus and to check for abnormalities.  Medicines may be given to thin the lining of the uterus. PROCEDURE  During the procedure, your health care provider will use a tool called a resectoscope to help see inside your uterus. There are different ways to remove the lining of your uterus.   Radiofrequency - This method uses a radiofrequency-alternating electric current to remove the lining of the uterus.  Cryotherapy - This method uses extreme cold to freeze the lining of the uterus.  Heated-Free Liquid - This method uses heated salt (saline) solution to remove the lining of the uterus.  Microwave - This method uses high-energy microwaves to heat up the lining of the uterus to remove it.  Thermal balloon - This method involves inserting a catheter with a balloon tip into the uterus. The balloon tip is filled with heated fluid to remove the lining of the uterus. AFTER THE PROCEDURE  After your procedure, do   not have sexual intercourse or insert anything into your vagina until permitted by your health care provider. After the procedure, you may experience:  Cramps.  Vaginal discharge.  Frequent urination. This information is not intended to replace advice given to you by your health care provider. Make sure you discuss any  questions you have with your health care provider. Document Released: 08/24/2004 Document Revised: 07/06/2015 Document Reviewed: 03/18/2013 Elsevier Interactive Patient Education  2017 [employer] Inc[employer]  

## 2016-12-04 ENCOUNTER — Ambulatory Visit (HOSPITAL_COMMUNITY)
Admission: RE | Admit: 2016-12-04 | Discharge: 2016-12-04 | Disposition: A | Discharge status: Home / Self Care | Payer: BLUE CROSS/BLUE SHIELD | Source: Ambulatory Visit | Attending: Obstetrics & Gynecology | Admitting: Obstetrics & Gynecology

## 2016-12-04 DIAGNOSIS — D252 Subserosal leiomyoma of uterus: Secondary | ICD-10-CM | POA: Insufficient documentation

## 2016-12-04 DIAGNOSIS — N939 Abnormal uterine and vaginal bleeding, unspecified: Secondary | ICD-10-CM | POA: Insufficient documentation

## 2016-12-04 DIAGNOSIS — D251 Intramural leiomyoma of uterus: Secondary | ICD-10-CM | POA: Insufficient documentation

## 2016-12-04 DIAGNOSIS — N924 Excessive bleeding in the premenopausal period: Secondary | ICD-10-CM

## 2016-12-04 DIAGNOSIS — N854 Malposition of uterus: Secondary | ICD-10-CM | POA: Diagnosis not present

## 2016-12-04 LAB — CYTOLOGY - PAP
DIAGNOSIS: NEGATIVE
HPV: NOT DETECTED

## 2016-12-26 ENCOUNTER — Telehealth: Payer: Self-pay

## 2016-12-31 ENCOUNTER — Encounter: Payer: Self-pay | Admitting: Obstetrics & Gynecology

## 2016-12-31 ENCOUNTER — Ambulatory Visit (INDEPENDENT_AMBULATORY_CARE_PROVIDER_SITE_OTHER): Payer: BLUE CROSS/BLUE SHIELD | Admitting: Obstetrics & Gynecology

## 2016-12-31 VITALS — BP 120/68 | HR 67 | Ht 70.0 in | Wt 251.3 lb

## 2016-12-31 DIAGNOSIS — N920 Excessive and frequent menstruation with regular cycle: Secondary | ICD-10-CM | POA: Diagnosis not present

## 2016-12-31 NOTE — Patient Instructions (Signed)
Endometrial Biopsy  Endometrial biopsy is a procedure in which a tissue sample is taken from inside the uterus. The sample is taken from the endometrium, which is the lining of the uterus. The tissue sample is then checked under a microscope to see if the tissue is normal or abnormal. This procedure helps to determine where you are in your menstrual cycle and how hormone levels are affecting the lining of the uterus. This procedure may also be used to evaluate uterine bleeding or to diagnose endometrial cancer, endometrial tuberculosis, polyps, or other inflammatory conditions.  Tell a health care provider about:   Any allergies you have.   All medicines you are taking, including vitamins, herbs, eye drops, creams, and over-the-counter medicines.   Any problems you or family members have had with anesthetic medicines.   Any blood disorders you have.   Any surgeries you have had.   Any medical conditions you have.   Whether you are pregnant or may be pregnant.  What are the risks?  Generally, this is a safe procedure. However, problems may occur, including:   Bleeding.   Pelvic infection.   Puncture of the wall of the uterus with the biopsy device (rare).    What happens before the procedure?   Keep a record of your menstrual cycles as told by your health care provider. You may need to schedule your procedure for a specific time in your cycle.   You may want to bring a sanitary pad to wear after the procedure.   Ask your health care provider about:  ? Changing or stopping your regular medicines. This is especially important if you are taking diabetes medicines or blood thinners.  ? Taking medicines such as aspirin and ibuprofen. These medicines can thin your blood. Do not take these medicines before your procedure if your health care provider instructs you not to.   Plan to have someone take you home from the hospital or clinic.  What happens during the procedure?   To lower your risk of  infection:  ? Your health care team will wash or sanitize their hands.   You will lie on an exam table with your feet and legs supported as in a pelvic exam.   Your health care provider will insert an instrument (speculum) into your vagina to see your cervix.   Your cervix will be cleansed with an antiseptic solution.   A medicine (local anesthetic) will be used to numb the cervix.   A forceps instrument (tenaculum) will be used to hold your cervix steady for the biopsy.   A thin, rod-like instrument (uterine sound) will be inserted through your cervix to determine the length of your uterus and the location where the biopsy sample will be removed.   A thin, flexible tube (catheter) will be inserted through your cervix and into the uterus. The catheter will be used to collect the biopsy sample from your endometrial tissue.   The catheter and speculum will then be removed, and the tissue sample will be sent to a lab for examination.  What happens after the procedure?   You will rest in a recovery area until you are ready to go home.   You may have mild cramping and a small amount of vaginal bleeding. This is normal.   It is up to you to get the results of your procedure. Ask your health care provider, or the department that is doing the procedure, when your results will be ready.  Summary     Endometrial biopsy is a procedure in which a tissue sample is taken from the endometrium, which is the lining of the uterus.   This procedure may help to diagnose menstrual cycle problems, abnormal bleeding, or other conditions affecting the endometrium.   Before the procedure, keep a record of your menstrual cycles as told by your health care provider.   The tissue sample that is removed will be checked under a microscope to see if it is normal or abnormal.  This information is not intended to replace advice given to you by your health care provider. Make sure you discuss any questions you have with your health care  provider.  Document Released: 02/15/2005 Document Revised: 10/31/2016 Document Reviewed: 10/31/2016  Elsevier Interactive Patient Education  2017 [employer] Inc[employer]

## 2016-12-31 NOTE — Progress Notes (Signed)
Subjective:     Patient ID: [name], female   DOB: 02/15/1976, 41 y.o.   MRN: [id number] F/u for hormonal treatment of vaginal bleeding [id number] No LMP recorded (approximate). No vaginal bleeding while using provera as prescribed.States she does not want and IUD, would prefer hysterectomy but will consider ablation  Past Medical History:  Diagnosis Date  . Abdominal pain   . Anemia   . Asthma   . Diarrhea   . Obesity   . Shortness of breath   . Vomiting    Past Surgical History:  Procedure Laterality Date  . CHOLECYSTECTOMY  06/19/11  . TUBAL LIGATION     No Known Allergies  Review of Systems  Respiratory: Negative.   Gastrointestinal: Negative.   Genitourinary: Positive for menstrual problem. Negative for vaginal bleeding and vaginal discharge.       Objective:   Physical Exam  Constitutional: She appears well-developed. No distress.  Cardiovascular: Normal rate.   Pulmonary/Chest: Effort normal.  Psychiatric: She has a normal mood and affect. Her behavior is normal.       Assessment:     DUB responded to provera    Plan:     RTC for endometrial biopsy as preparation for possible ablation vs TVH  Woodroe Mode, MD 12/31/2016

## 2017-01-21 ENCOUNTER — Ambulatory Visit (INDEPENDENT_AMBULATORY_CARE_PROVIDER_SITE_OTHER): Payer: BLUE CROSS/BLUE SHIELD | Admitting: Obstetrics & Gynecology

## 2017-01-21 ENCOUNTER — Other Ambulatory Visit (HOSPITAL_COMMUNITY)
Admission: RE | Admit: 2017-01-21 | Discharge: 2017-01-21 | Disposition: A | Discharge status: Home / Self Care | Payer: BLUE CROSS/BLUE SHIELD | Source: Ambulatory Visit | Attending: Obstetrics & Gynecology | Admitting: Obstetrics & Gynecology

## 2017-01-21 ENCOUNTER — Encounter: Payer: Self-pay | Admitting: Obstetrics & Gynecology

## 2017-01-21 VITALS — BP 124/69 | HR 82 | Ht 70.0 in | Wt 254.0 lb

## 2017-01-21 DIAGNOSIS — N858 Other specified noninflammatory disorders of uterus: Secondary | ICD-10-CM | POA: Diagnosis not present

## 2017-01-21 DIAGNOSIS — N920 Excessive and frequent menstruation with regular cycle: Secondary | ICD-10-CM

## 2017-01-21 LAB — POCT PREGNANCY, URINE: Preg Test, Ur: NEGATIVE

## 2017-01-21 NOTE — Progress Notes (Signed)
   Patient given informed consent, signed copy in the chart, time out was performed. Appropriate time out taken. . The patient was placed in the lithotomy position and the cervix brought into view with sterile speculum.  Portio of cervix cleansed x 2 with betadine swabs.  A tenaculum was placed in the anterior lip of the cervix.  The uterus was sounded for depth of 9 cm. A pipelle was introduced to into the uterus, suction created,  and an endometrial sample was obtained. All equipment was removed and accounted for.  The patient tolerated the procedure well.    Patient given post procedure instructions. The patient will return in 3 weeks for results and discuss further management. She will continue to tale Provera daily.  Woodroe Mode, MD 01/21/2017

## 2017-02-07 NOTE — Telephone Encounter (Signed)
Enter in error

## 2017-03-13 ENCOUNTER — Encounter: Payer: Self-pay | Admitting: Family Medicine

## 2017-03-13 ENCOUNTER — Encounter: Payer: Self-pay | Admitting: Obstetrics & Gynecology

## 2017-03-13 ENCOUNTER — Ambulatory Visit (INDEPENDENT_AMBULATORY_CARE_PROVIDER_SITE_OTHER): Payer: BLUE CROSS/BLUE SHIELD | Admitting: Obstetrics & Gynecology

## 2017-03-13 VITALS — BP 117/71 | HR 84 | Wt 251.6 lb

## 2017-03-13 DIAGNOSIS — N920 Excessive and frequent menstruation with regular cycle: Secondary | ICD-10-CM | POA: Diagnosis not present

## 2017-03-13 MED ORDER — LEUPROLIDE ACETATE 3.75 MG IM KIT
11.2500 mg | PACK | Freq: Once | INTRAMUSCULAR | Status: AC
  Administered 2017-03-13: 11.25 mg via INTRAMUSCULAR

## 2017-03-13 NOTE — Patient Instructions (Signed)
Vaginal Hysterectomy A vaginal hysterectomy is a procedure to remove all or part of the uterus through a small incision in the vagina. In this procedure, your health care provider may remove your entire uterus, including the lower end (cervix). You may need a vaginal hysterectomy to treat:  Uterine fibroids.  A condition that causes the lining of the uterus to grow in other areas (endometriosis).  Problems with pelvic support.  Cancer of the cervix, ovaries, uterus, or tissue that lines the uterus (endometrium).  Excessive (dysfunctional) uterine bleeding. When removing your uterus, your health care provider may also remove the organs that produce eggs (ovaries) and the tubes that carry eggs to your uterus (fallopian tubes). After a vaginal hysterectomy, you will no longer be able to have a baby. You will also no longer get your menstrual period. Tell a health care provider about:  Any allergies you have.  All medicines you are taking, including vitamins, herbs, eye drops, creams, and over-the-counter medicines.  Any problems you or family members have had with anesthetic medicines.  Any blood disorders you have.  Any surgeries you have had.  Any medical conditions you have.  Whether you are pregnant or may be pregnant. What are the risks? Generally, this is a safe procedure. However, problems may occur, including:  Bleeding.  Infection.  A blood clot that forms in your leg and travels to your lungs (pulmonary embolism).  Damage to surrounding organs.  Pain during sex. What happens before the procedure?  Ask your health care provider what organs will be removed during surgery.  Ask your health care provider about:  Changing or stopping your regular medicines. This is especially important if you are taking diabetes medicines or blood thinners.  Taking medicines such as aspirin and ibuprofen. These medicines can thin your blood. Do not take these medicines before your  procedure if your health care provider instructs you not to.  Follow instructions from your health care provider about eating or drinking restrictions.  Do not use any tobacco products, such as cigarettes, chewing tobacco, and e-cigarettes. If you need help quitting, ask your health care provider.  Plan to have someone take you home after discharge from the hospital. What happens during the procedure?  To reduce your risk of infection:  Your health care team will wash or sanitize their hands.  Your skin will be washed with soap.  An IV tube will be inserted into one of your veins.  You may be given antibiotic medicine to help prevent infection.  You will be given one or more of the following:  A medicine to help you relax (sedative).  A medicine to numb the area (local anesthetic).  A medicine to make you fall asleep (general anesthetic).  A medicine that is injected into an area of your body to numb everything beyond the injection site (regional anesthetic).  Your surgeon will make an incision in your vagina.  Your surgeon will locate and remove all or part of your uterus.  Your ovaries and fallopian tubes may be removed at the same time.  The incision will be closed with stitches (sutures) that dissolve over time. The procedure may vary among health care providers and hospitals. What happens after the procedure?  Your blood pressure, heart rate, breathing rate, and blood oxygen level will be monitored often until the medicines you were given have worn off.  You will be encouraged to get up and walk around after a few hours to help prevent complications.  You may have IV tubes in place for a few days.  You will be given pain medicine as needed.  Do not drive for 24 hours if you were given a sedative. This information is not intended to replace advice given to you by your health care provider. Make sure you discuss any questions you have with your health care  provider. Document Released: 02/06/2016 Document Revised: 03/22/2016 Document Reviewed: 10/30/2015 Elsevier Interactive Patient Education  2017 [employer].

## 2017-03-13 NOTE — Progress Notes (Signed)
Patient ID: [name], female   DOB: 1975/11/16, 41 y.o.   MRN: [id number]  Cc: heavy vaginal bleeding  HPI [name] is a 41 y.o. female.  [id number] Patient's last menstrual period was 03/13/2017. Bleeding recurred 8 days ago and is heavy despite taking Megace. She is ready to schedule hysterectomy, declines ablation. HPI  Past Medical History:  Diagnosis Date  . Abdominal pain   . Anemia   . Asthma   . Diarrhea   . Obesity   . Shortness of breath   . Vomiting     Past Surgical History:  Procedure Laterality Date  . CHOLECYSTECTOMY  06/19/11  . TUBAL LIGATION      Family History  Problem Relation Age of Onset  . Diabetes Mother 78  . Bladder Cancer Mother   . Emphysema Father 87       smoked  . Emphysema Maternal Aunt        smoked  . Lung cancer Maternal Aunt        smoked  . Heart disease Maternal Grandfather   . Heart disease Maternal Aunt   . Heart disease Sister     Social History Social History  Substance Use Topics  . Smoking status: Never Smoker  . Smokeless tobacco: Never Used  . Alcohol use No    No Known Allergies  Current Outpatient Prescriptions  Medication Sig Dispense Refill  . albuterol (PROVENTIL HFA;VENTOLIN HFA) 108 (90 BASE) MCG/ACT inhaler Inhale 2 puffs into the lungs every 4 (four) hours as needed for wheezing. wheezing 1 Inhaler 0  . famotidine (PEPCID) 20 MG tablet One at bedtime (Patient taking differently: Take 20 mg by mouth at bedtime. ) 30 tablet 2  . medroxyPROGESTERone (PROVERA) 10 MG tablet Take 2 tablets (20 mg total) by mouth daily. 30 tablet 2  . mometasone-formoterol (DULERA) 100-5 MCG/ACT AERO Take 2 puffs first thing in am and then another 2 puffs about 12 hours later.    Marland Kitchen acetaminophen (TYLENOL) 500 MG tablet Take 1,000 mg by mouth every 6 (six) hours as needed for mild pain, moderate pain or headache. pain    . albuterol (PROVENTIL) (2.5 MG/3ML) 0.083% nebulizer solution Take 3 mL (2.5 mg total) by  nebulization every 6 (six) hours as needed for wheezing or shortness of breath. wheezing (Patient not taking: Reported on 03/13/2017) 75 mL 0  . HYDROcodone-acetaminophen (NORCO) 5-325 MG tablet Take 1 tablet by mouth every 6 (six) hours as needed. (Patient not taking: Reported on 03/13/2017) 15 tablet 0  . indomethacin (INDOCIN) 25 MG capsule Take 1 capsule (25 mg total) by mouth 3 (three) times daily as needed. (Patient not taking: Reported on 03/13/2017) 30 capsule 0   Current Facility-Administered Medications  Medication Dose Route Frequency Provider Last Rate Last Dose  . leuprolide (LUPRON) injection 11.25 mg  11.25 mg Intramuscular Once Woodroe Mode, MD        Review of Systems Review of Systems  Constitutional: Negative.   Respiratory: Negative.   Gastrointestinal: Negative.   Genitourinary: Positive for menstrual problem and vaginal bleeding.    Blood pressure 117/71, pulse 84, weight 251 lb 9.6 oz (114.1 kg), last menstrual period 03/13/2017.  Physical Exam Physical Exam  Constitutional: She appears well-developed. No distress.  HENT:  Head: Normocephalic and atraumatic.  Neck: Normal range of motion. Neck supple.  Pulmonary/Chest: Effort normal.  Skin: No pallor.  Psychiatric: She has a normal mood and affect. Her behavior is normal.  Vitals reviewed.  Data Reviewed Endometrial biopsy and pap benign [place] reviewed CBC    Component Value Date/Time   WBC 6.5 11/29/2016 1616   RBC 4.05 11/29/2016 1616   HGB 9.3 (L) 11/29/2016 1616   HCT 30.5 (L) 11/29/2016 1616   PLT 221 11/29/2016 1616   MCV 75.3 (L) 11/29/2016 1616   MCH 23.0 (L) 11/29/2016 1616   MCHC 30.5 (L) 11/29/2016 1616   RDW 14.4 11/29/2016 1616   LYMPHSABS 1.7 03/15/2014 2302   MONOABS 0.2 03/15/2014 2302   EOSABS 0.1 03/15/2014 2302   BASOSABS 0.0 03/15/2014 2302     Assessment    Menorrhagia unresponsive to progestin therapy She is sterilized and does not want to conceive again, ready to  proceed with hysterectomy, TVH offered    Plan    Patient desires surgical management with TVH.  The risks of surgery were discussed in detail with the patient including but not limited to: bleeding which may require transfusion or reoperation; infection which may require prolonged hospitalization or re-hospitalization and antibiotic therapy; injury to bowel, bladder, ureters and major vessels or other surrounding organs; need for additional procedures including laparotomy; thromboembolic phenomenon, incisional problems and other postoperative or anesthesia complications.  Patient was told that the likelihood that her condition and symptoms will be treated effectively with this surgical management was very high; the postoperative expectations were also discussed in detail. The patient also understands the alternative treatment options which were discussed in full. All questions were answered.  She was told that she will be contacted by our surgical scheduler regarding the time and date of her surgery; routine preoperative instructions of having nothing to eat or drink after midnight on the day prior to surgery and also coming to the hospital 1.5 hours prior to her time of surgery were also emphasized.  She was told she may be called for a preoperative appointment about a week prior to surgery and will be given further preoperative instructions at that visit. Printed patient education handouts about the procedure were given to the patient to review at home.         [name] 03/13/2017, 1:32 PM

## 2017-05-03 ENCOUNTER — Other Ambulatory Visit: Payer: Self-pay | Admitting: Obstetrics & Gynecology

## 2017-05-08 ENCOUNTER — Telehealth: Payer: Self-pay | Admitting: *Deleted

## 2017-05-08 NOTE — Telephone Encounter (Signed)
Pt left message yesterday stating that she needs a refill of Megace. Her [facility] had requested the refill and has not had a response. After reviewing pt EMR I called and left a message stating that a prescription was sent to her pharmacy 2 days ago for a different medication (Provera) than that which was requested because in May she reported that her problem was not helped by the requested medication (Megace). Please obtain the medication and if it is not helping, call back with a new message.

## 2018-01-01 ENCOUNTER — Encounter (HOSPITAL_COMMUNITY): Payer: Self-pay | Admitting: Emergency Medicine

## 2018-01-01 ENCOUNTER — Ambulatory Visit (HOSPITAL_COMMUNITY)
Admission: EM | Admit: 2018-01-01 | Discharge: 2018-01-01 | Disposition: A | Discharge status: Home / Self Care | Payer: BLUE CROSS/BLUE SHIELD | Attending: Family Medicine | Admitting: Family Medicine

## 2018-01-01 DIAGNOSIS — M779 Enthesopathy, unspecified: Secondary | ICD-10-CM

## 2018-01-01 MED ORDER — DEXAMETHASONE SODIUM PHOSPHATE 10 MG/ML IJ SOLN
10.0000 mg | Freq: Once | INTRAMUSCULAR | Status: AC
  Administered 2018-01-01: 10 mg via INTRAMUSCULAR

## 2018-01-01 MED ORDER — DEXAMETHASONE SODIUM PHOSPHATE 10 MG/ML IJ SOLN
INTRAMUSCULAR | Status: AC
  Filled 2018-01-01: qty 1

## 2018-01-01 MED ORDER — MELOXICAM 7.5 MG PO TABS: 7.5000 mg | ORAL_TABLET | Freq: Every day | ORAL | 0 refills | Status: DC

## 2018-01-01 NOTE — ED Triage Notes (Signed)
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

## 2018-01-01 NOTE — ED Provider Notes (Signed)
[place]    CSN: [id number] Arrival date & time: 01/01/18  1215     History   Chief Complaint Chief Complaint  Patient presents with  . Hand Pain    HPI [name] is a 42 y.o. female.   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  States she has soreness after working for the past few days.  Woke up this morning with increased pain. Pain radiating from right hand up to the shoulder and neck.  Denies any decreased range of motion.  Denies injury.  Intermittent numbness and tingling of the fingers.  Tried ibuprofen 800 mg 2 doses, topical medications, heat compress without relief.  Denies history of diabetes.       Past Medical History:  Diagnosis Date  . Abdominal pain   . Anemia   . Asthma   . Diarrhea   . Obesity   . Shortness of breath   . Vomiting     Patient Active Problem List   Diagnosis Date Noted  . Menorrhagia with regular cycle 01/07/2015  . Chronic asthma 07/31/2013    Past Surgical History:  Procedure Laterality Date  . CHOLECYSTECTOMY  06/19/11  . TUBAL LIGATION      OB History    Gravida Para Term Preterm AB Living   5 4 3 1 1 4    SAB TAB Ectopic Multiple Live Births   1       4       Home Medications    Prior to Admission medications   Medication Sig Start Date End Date Taking? Authorizing Provider  acetaminophen (TYLENOL) 500 MG tablet Take 1,000 mg by mouth every 6 (six) hours as needed for mild pain, moderate pain or headache. pain    [provider]  albuterol (PROVENTIL HFA;VENTOLIN HFA) 108 (90 BASE) MCG/ACT inhaler Inhale 2 puffs into the lungs every 4 (four) hours as needed for wheezing. wheezing 06/23/13   Reyne Dumas, MD  albuterol (PROVENTIL) (2.5 MG/3ML) 0.083% nebulizer solution Take 3 mL (2.5 mg total) by nebulization every 6 (six) hours as needed for wheezing or shortness of breath. wheezing Patient not taking: Reported on  03/13/2017 06/12/13   Jonetta Osgood, MD  famotidine (PEPCID) 20 MG tablet One at bedtime Patient taking differently: Take 20 mg by mouth at bedtime.  09/03/13   Tanda Rockers, MD  HYDROcodone-acetaminophen (NORCO) 5-325 MG tablet Take 1 tablet by mouth every 6 (six) hours as needed. Patient not taking: Reported on 03/13/2017 07/02/16   Ashley Murrain, NP  indomethacin (INDOCIN) 25 MG capsule Take 1 capsule (25 mg total) by mouth 3 (three) times daily as needed. Patient not taking: Reported on 03/13/2017 07/02/16   Ashley Murrain, NP  medroxyPROGESTERone (PROVERA) 10 MG tablet TAKE TWO TABLETS BY MOUTH ONCE DAILY 05/06/17   Woodroe Mode, MD  meloxicam (MOBIC) 7.5 MG tablet Take 1 tablet (7.5 mg total) by mouth daily. 01/01/18   Tasia Catchings, Amy V, PA-C  mometasone-formoterol (DULERA) 100-5 MCG/ACT AERO Take 2 puffs first thing in am and then another 2 puffs about 12 hours later. 07/30/13   Tanda Rockers, MD    Family History Family History  Problem Relation Age of Onset  . Diabetes Mother 65  . Bladder Cancer Mother   . Emphysema Father 53       smoked  . Emphysema Maternal Aunt  smoked  . Lung cancer Maternal Aunt        smoked  . Heart disease Maternal Grandfather   . Heart disease Maternal Aunt   . Heart disease Sister     Social History Social History   Tobacco Use  . Smoking status: Never Smoker  . Smokeless tobacco: Never Used  Substance Use Topics  . Alcohol use: No  . Drug use: No     Allergies   Patient has no known allergies.   Review of Systems Review of Systems  Reason unable to perform ROS: See HPI as above.     Physical Exam Triage Vital Signs ED Triage Vitals [01/01/18 1303]  Enc Vitals Group     BP 132/68     Pulse Rate 72     Resp 18     Temp 98.1 F (36.7 C)     Temp Source Oral     SpO2 100 %     Weight      Height      Head Circumference      Peak Flow      Pain Score 8     Pain Loc      Pain Edu?      Excl. in [place]?    No data  found.  Updated Vital Signs BP 132/68 (BP Location: Right Arm)   Pulse 72   Temp 98.1 F (36.7 C) (Oral)   Resp 18   SpO2 100%   Physical Exam  Constitutional: She is oriented to person, place, and time. She appears well-developed and well-nourished. No distress.  HENT:  Head: Normocephalic and atraumatic.  Eyes: Conjunctivae are normal. Pupils are equal, round, and reactive to light.  Cardiovascular: Normal rate, regular rhythm and normal heart sounds. Exam reveals no gallop and no friction rub.  No murmur heard. Pulmonary/Chest: Effort normal and breath sounds normal. She has no wheezes. She has no rales.  Musculoskeletal:  No tenderness on palpation of the spinous processes.  Diffuse tenderness to light touch of right neck, trapezius muscle, upper arm, wrist, hand. Most tender at right first MTP. Full range of motion. Grip strength decreased due to pain of hand. Sensation intact and equal bilaterally. Radial pulses 2+ and equal bilaterally. Capillary refill less than 2 seconds.   Given patient with diffuse tenderness to light touch, deferred Phalen's, Tinel's, Finkelstein.   Neurological: She is alert and oriented to person, place, and time.  Skin: Skin is warm and dry.    UC Treatments / Results  Labs (all labs ordered are listed, but only abnormal results are displayed) Labs Reviewed - No data to display  EKG  EKG Interpretation None       Radiology No results found.  Procedures Procedures (including critical care time)  Medications Ordered in UC Medications  dexamethasone (DECADRON) injection 10 mg (10 mg Intramuscular Given 01/01/18 1403)     Initial Impression / Assessment and Plan / UC Course  I have reviewed the triage vital signs and the nursing notes.  Pertinent labs & imaging results that were available during my care of the patient were reviewed by me and considered in my medical decision making (see chart for details).    Will treat for tendinitis  given recent increase in activity.  Decadron injection in office today.  Mobic as directed.  Ice compress, elevation, wrist splint during activity.  Patient to follow-up with orthopedics for further evaluation  if symptoms not improving.  Final Clinical Impressions(s) / UC  Diagnoses   Final diagnoses:  Tendinitis    ED Discharge Orders        Ordered    meloxicam (MOBIC) 7.5 MG tablet  Daily     01/01/18 1354        Ok Edwards, PA-C 01/01/18 1409

## 2018-01-01 NOTE — Discharge Instructions (Signed)
Decadron injection in office today to help with inflammation. Mobic as directed to help with pain and inflammation.  Ice compress.  Wrist splint during activity.  This can take up to 3-4 weeks to completely resolve, but should be feeling better each week.  Follow-up with PCP/orthopedics if symptoms not improving.

## 2018-06-19 ENCOUNTER — Other Ambulatory Visit: Payer: Self-pay | Admitting: Obstetrics & Gynecology

## 2018-08-24 ENCOUNTER — Inpatient Hospital Stay (HOSPITAL_COMMUNITY)
Admission: EM | Admit: 2018-08-24 | Discharge: 2018-08-28 | DRG: 202 | Disposition: A | Discharge status: Home / Self Care | Payer: BLUE CROSS/BLUE SHIELD | Attending: Internal Medicine | Admitting: Internal Medicine

## 2018-08-24 ENCOUNTER — Encounter (HOSPITAL_COMMUNITY): Payer: Self-pay | Admitting: Emergency Medicine

## 2018-08-24 ENCOUNTER — Emergency Department (HOSPITAL_COMMUNITY): Payer: BLUE CROSS/BLUE SHIELD

## 2018-08-24 DIAGNOSIS — J209 Acute bronchitis, unspecified: Secondary | ICD-10-CM | POA: Diagnosis present

## 2018-08-24 DIAGNOSIS — E876 Hypokalemia: Secondary | ICD-10-CM | POA: Diagnosis present

## 2018-08-24 DIAGNOSIS — E669 Obesity, unspecified: Secondary | ICD-10-CM | POA: Diagnosis present

## 2018-08-24 DIAGNOSIS — Z79899 Other long term (current) drug therapy: Secondary | ICD-10-CM

## 2018-08-24 DIAGNOSIS — Z79891 Long term (current) use of opiate analgesic: Secondary | ICD-10-CM

## 2018-08-24 DIAGNOSIS — T380X5A Adverse effect of glucocorticoids and synthetic analogues, initial encounter: Secondary | ICD-10-CM | POA: Diagnosis present

## 2018-08-24 DIAGNOSIS — R51 Headache: Secondary | ICD-10-CM | POA: Diagnosis present

## 2018-08-24 DIAGNOSIS — D500 Iron deficiency anemia secondary to blood loss (chronic): Secondary | ICD-10-CM | POA: Diagnosis present

## 2018-08-24 DIAGNOSIS — J4541 Moderate persistent asthma with (acute) exacerbation: Secondary | ICD-10-CM | POA: Diagnosis not present

## 2018-08-24 DIAGNOSIS — Z801 Family history of malignant neoplasm of trachea, bronchus and lung: Secondary | ICD-10-CM

## 2018-08-24 DIAGNOSIS — Z8249 Family history of ischemic heart disease and other diseases of the circulatory system: Secondary | ICD-10-CM

## 2018-08-24 DIAGNOSIS — Z23 Encounter for immunization: Secondary | ICD-10-CM

## 2018-08-24 DIAGNOSIS — Z825 Family history of asthma and other chronic lower respiratory diseases: Secondary | ICD-10-CM

## 2018-08-24 DIAGNOSIS — Z6841 Body Mass Index (BMI) 40.0 and over, adult: Secondary | ICD-10-CM

## 2018-08-24 DIAGNOSIS — Z791 Long term (current) use of non-steroidal anti-inflammatories (NSAID): Secondary | ICD-10-CM

## 2018-08-24 DIAGNOSIS — Z9049 Acquired absence of other specified parts of digestive tract: Secondary | ICD-10-CM

## 2018-08-24 DIAGNOSIS — Z833 Family history of diabetes mellitus: Secondary | ICD-10-CM

## 2018-08-24 DIAGNOSIS — R0602 Shortness of breath: Secondary | ICD-10-CM | POA: Diagnosis not present

## 2018-08-24 DIAGNOSIS — J181 Lobar pneumonia, unspecified organism: Secondary | ICD-10-CM | POA: Diagnosis present

## 2018-08-24 DIAGNOSIS — Z9851 Tubal ligation status: Secondary | ICD-10-CM

## 2018-08-24 DIAGNOSIS — J45901 Unspecified asthma with (acute) exacerbation: Secondary | ICD-10-CM | POA: Diagnosis present

## 2018-08-24 DIAGNOSIS — Z8052 Family history of malignant neoplasm of bladder: Secondary | ICD-10-CM

## 2018-08-24 DIAGNOSIS — R0902 Hypoxemia: Secondary | ICD-10-CM | POA: Diagnosis present

## 2018-08-24 LAB — CBC WITH DIFFERENTIAL/PLATELET
Abs Immature Granulocytes: 0.02 10*3/uL (ref 0.00–0.07)
BASOS PCT: 0 %
Basophils Absolute: 0 10*3/uL (ref 0.0–0.1)
EOS ABS: 0.1 10*3/uL (ref 0.0–0.5)
EOS PCT: 1 %
HEMATOCRIT: 39.1 % (ref 36.0–46.0)
Hemoglobin: 11.1 g/dL — ABNORMAL LOW (ref 12.0–15.0)
Immature Granulocytes: 0 %
LYMPHS ABS: 4 10*3/uL (ref 0.7–4.0)
Lymphocytes Relative: 43 %
MCH: 22.4 pg — AB (ref 26.0–34.0)
MCHC: 28.4 g/dL — ABNORMAL LOW (ref 30.0–36.0)
MCV: 78.8 fL — ABNORMAL LOW (ref 80.0–100.0)
MONO ABS: 0.9 10*3/uL (ref 0.1–1.0)
MONOS PCT: 10 %
Neutro Abs: 4.3 10*3/uL (ref 1.7–7.7)
Neutrophils Relative %: 46 %
PLATELETS: 224 10*3/uL (ref 150–400)
RBC: 4.96 MIL/uL (ref 3.87–5.11)
RDW: 13.1 % (ref 11.5–15.5)
WBC: 9.3 10*3/uL (ref 4.0–10.5)
nRBC: 0 % (ref 0.0–0.2)

## 2018-08-24 LAB — CREATININE, SERUM
Creatinine, Ser: 1 mg/dL (ref 0.44–1.00)
GFR calc non Af Amer: >60 mL/min (ref >60)

## 2018-08-24 LAB — BASIC METABOLIC PANEL
Anion gap: 15 (ref 5–15)
BUN: 10 mg/dL (ref 6–20)
CALCIUM: 9.1 mg/dL (ref 8.9–10.3)
CO2: 20 mmol/L — AB (ref 22–32)
Chloride: 104 mmol/L (ref 98–111)
Creatinine, Ser: 0.89 mg/dL (ref 0.44–1.00)
GFR calc Af Amer: >60 mL/min (ref >60)
GLUCOSE: 104 mg/dL — AB (ref 70–99)
Potassium: 3.3 mmol/L — ABNORMAL LOW (ref 3.5–5.1)
Sodium: 139 mmol/L (ref 135–145)

## 2018-08-24 LAB — CBC
HEMATOCRIT: 34.6 % — AB (ref 36.0–46.0)
Hemoglobin: 10.3 g/dL — ABNORMAL LOW (ref 12.0–15.0)
MCH: 22.9 pg — AB (ref 26.0–34.0)
MCHC: 29.8 g/dL — ABNORMAL LOW (ref 30.0–36.0)
MCV: 76.9 fL — AB (ref 80.0–100.0)
PLATELETS: 229 10*3/uL (ref 150–400)
RBC: 4.5 MIL/uL (ref 3.87–5.11)
RDW: 13.1 % (ref 11.5–15.5)
WBC: 9.2 10*3/uL (ref 4.0–10.5)
nRBC: 0 % (ref 0.0–0.2)

## 2018-08-24 LAB — HIV ANTIBODY (ROUTINE TESTING W REFLEX): HIV SCREEN 4TH GENERATION: NONREACTIVE

## 2018-08-24 LAB — I-STAT BETA HCG BLOOD, ED (MC, WL, AP ONLY): I-stat hCG, quantitative: <5 m[IU]/mL (ref <5)

## 2018-08-24 MED ORDER — ALBUTEROL (5 MG/ML) CONTINUOUS INHALATION SOLN
INHALATION_SOLUTION | RESPIRATORY_TRACT | Status: AC
  Administered 2018-08-24: 10 mg/h via RESPIRATORY_TRACT
  Filled 2018-08-24: qty 20

## 2018-08-24 MED ORDER — MAGNESIUM SULFATE 2 GM/50ML IV SOLN
2.0000 g | Freq: Once | INTRAVENOUS | Status: AC
  Administered 2018-08-24: 2 g via INTRAVENOUS
  Filled 2018-08-24: qty 50

## 2018-08-24 MED ORDER — ALBUTEROL SULFATE (2.5 MG/3ML) 0.083% IN NEBU: 2.5000 mg | INHALATION_SOLUTION | Freq: Four times a day (QID) | RESPIRATORY_TRACT | Status: DC

## 2018-08-24 MED ORDER — IPRATROPIUM-ALBUTEROL 0.5-2.5 (3) MG/3ML IN SOLN
3.0000 mL | Freq: Four times a day (QID) | RESPIRATORY_TRACT | Status: DC
  Administered 2018-08-24 – 2018-08-26 (×5): 3 mL via RESPIRATORY_TRACT
  Filled 2018-08-24 (×7): qty 3

## 2018-08-24 MED ORDER — ALBUTEROL SULFATE (2.5 MG/3ML) 0.083% IN NEBU
INHALATION_SOLUTION | RESPIRATORY_TRACT | Status: AC
  Administered 2018-08-24: 02:00:00
  Filled 2018-08-24: qty 6

## 2018-08-24 MED ORDER — ONDANSETRON HCL 4 MG/2ML IJ SOLN
4.0000 mg | Freq: Once | INTRAMUSCULAR | Status: AC
  Administered 2018-08-24: 4 mg via INTRAVENOUS
  Filled 2018-08-24: qty 2

## 2018-08-24 MED ORDER — PNEUMOCOCCAL VAC POLYVALENT 25 MCG/0.5ML IJ INJ
0.5000 mL | INJECTION | INTRAMUSCULAR | Status: AC
  Administered 2018-08-25: 0.5 mL via INTRAMUSCULAR
  Filled 2018-08-24: qty 0.5

## 2018-08-24 MED ORDER — FLUTICASONE FUROATE-VILANTEROL 200-25 MCG/INH IN AEPB
1.0000 | INHALATION_SPRAY | Freq: Every day | RESPIRATORY_TRACT | Status: DC
  Administered 2018-08-25 – 2018-08-28 (×4): 1 via RESPIRATORY_TRACT
  Filled 2018-08-24: qty 28

## 2018-08-24 MED ORDER — METHYLPREDNISOLONE SODIUM SUCC 125 MG IJ SOLR
60.0000 mg | Freq: Four times a day (QID) | INTRAMUSCULAR | Status: DC
  Administered 2018-08-24 – 2018-08-27 (×11): 60 mg via INTRAVENOUS
  Filled 2018-08-24 (×11): qty 2

## 2018-08-24 MED ORDER — GUAIFENESIN-DM 100-10 MG/5ML PO SYRP
5.0000 mL | ORAL_SOLUTION | ORAL | Status: DC | PRN
  Administered 2018-08-24 – 2018-08-27 (×2): 5 mL via ORAL
  Filled 2018-08-24 (×2): qty 5

## 2018-08-24 MED ORDER — ENOXAPARIN SODIUM 40 MG/0.4ML ~~LOC~~ SOLN
40.0000 mg | Freq: Every day | SUBCUTANEOUS | Status: DC
  Administered 2018-08-24 – 2018-08-28 (×5): 40 mg via SUBCUTANEOUS
  Filled 2018-08-24 (×5): qty 0.4

## 2018-08-24 MED ORDER — FAMOTIDINE IN NACL 20-0.9 MG/50ML-% IV SOLN
20.0000 mg | Freq: Once | INTRAVENOUS | Status: AC
  Administered 2018-08-24: 20 mg via INTRAVENOUS
  Filled 2018-08-24: qty 50

## 2018-08-24 MED ORDER — POTASSIUM CHLORIDE 10 MEQ/100ML IV SOLN
10.0000 meq | INTRAVENOUS | Status: AC
  Administered 2018-08-24 (×2): 10 meq via INTRAVENOUS
  Filled 2018-08-24 (×2): qty 100

## 2018-08-24 MED ORDER — MEDROXYPROGESTERONE ACETATE 10 MG PO TABS
20.0000 mg | ORAL_TABLET | Freq: Every day | ORAL | Status: DC
  Administered 2018-08-24: 20 mg via ORAL
  Filled 2018-08-24: qty 2

## 2018-08-24 MED ORDER — IBUPROFEN 400 MG PO TABS: 400.0000 mg | ORAL_TABLET | Freq: Four times a day (QID) | ORAL | Status: DC | PRN

## 2018-08-24 MED ORDER — ALBUTEROL SULFATE (2.5 MG/3ML) 0.083% IN NEBU
2.5000 mg | INHALATION_SOLUTION | Freq: Once | RESPIRATORY_TRACT | Status: AC
  Administered 2018-08-24: 2.5 mg via RESPIRATORY_TRACT
  Filled 2018-08-24: qty 3

## 2018-08-24 MED ORDER — ALBUTEROL (5 MG/ML) CONTINUOUS INHALATION SOLN
10.0000 mg/h | INHALATION_SOLUTION | RESPIRATORY_TRACT | Status: DC
  Administered 2018-08-24: 10 mg/h via RESPIRATORY_TRACT

## 2018-08-24 MED ORDER — ZOLPIDEM TARTRATE 5 MG PO TABS
5.0000 mg | ORAL_TABLET | Freq: Every evening | ORAL | Status: DC | PRN
  Administered 2018-08-24 – 2018-08-27 (×4): 5 mg via ORAL
  Filled 2018-08-24 (×4): qty 1

## 2018-08-24 MED ORDER — ALBUTEROL SULFATE (2.5 MG/3ML) 0.083% IN NEBU
2.5000 mg | INHALATION_SOLUTION | RESPIRATORY_TRACT | Status: DC
  Administered 2018-08-24 (×3): 2.5 mg via RESPIRATORY_TRACT
  Filled 2018-08-24 (×3): qty 3

## 2018-08-24 MED ORDER — ACETAMINOPHEN 325 MG PO TABS
650.0000 mg | ORAL_TABLET | Freq: Four times a day (QID) | ORAL | Status: DC | PRN
  Filled 2018-08-24: qty 2

## 2018-08-24 MED ORDER — PREDNISONE 50 MG PO TABS
50.0000 mg | ORAL_TABLET | Freq: Every day | ORAL | Status: DC
  Administered 2018-08-24: 50 mg via ORAL
  Filled 2018-08-24: qty 3

## 2018-08-24 MED ORDER — IPRATROPIUM-ALBUTEROL 0.5-2.5 (3) MG/3ML IN SOLN: 3.0000 mL | Freq: Four times a day (QID) | RESPIRATORY_TRACT | Status: DC | PRN

## 2018-08-24 MED ORDER — ONDANSETRON HCL 4 MG/2ML IJ SOLN
4.0000 mg | Freq: Four times a day (QID) | INTRAMUSCULAR | Status: DC | PRN
  Administered 2018-08-26 – 2018-08-27 (×2): 4 mg via INTRAVENOUS
  Filled 2018-08-24 (×2): qty 2

## 2018-08-24 MED ORDER — LORAZEPAM 2 MG/ML IJ SOLN
0.5000 mg | Freq: Once | INTRAMUSCULAR | Status: AC
  Administered 2018-08-24: 0.5 mg via INTRAVENOUS
  Filled 2018-08-24: qty 1

## 2018-08-24 MED ORDER — INFLUENZA VAC SPLIT QUAD 0.5 ML IM SUSY
0.5000 mL | PREFILLED_SYRINGE | INTRAMUSCULAR | Status: DC
  Filled 2018-08-24: qty 0.5

## 2018-08-24 MED ORDER — METHYLPREDNISOLONE SODIUM SUCC 125 MG IJ SOLR
125.0000 mg | Freq: Once | INTRAMUSCULAR | Status: AC
  Administered 2018-08-24: 125 mg via INTRAVENOUS
  Filled 2018-08-24: qty 2
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
## 2018-08-24 NOTE — ED Notes (Signed)
Family at bedside. 

## 2018-08-24 NOTE — ED Triage Notes (Signed)
Pt presents with constant cough, minimal lung sounds bilat, some wheezing; tachypnea; pt unable to speak/speaks in short phrases; unable to get a story as to what happened d/t excessive cough

## 2018-08-24 NOTE — ED Notes (Signed)
Pt given meal tray- also taken to RR in wheelchair, after returning to bed pt having increased sob with wheezing. Able to slow breathing once back in bed. Sat's 98%.  Family at bedside.

## 2018-08-24 NOTE — Progress Notes (Signed)
  [name] is a 42 y.o. female with asthma (intermittent, per family) who presents with acute SOB and wheezing after being at a house party where there was smoking indoors. Symptoms began acutely starting around 1AM with wheezing and coughing, associated chest tightness was admitted for acute asthma exacerbation.   Plan   1. Change to IV solumedrol 60 every 6 hours.  Duo nebs every 6 hours.  Motrin for headache.  Monitor another 24 hours.  Requiring 2lit of [place] of oxygen.    Hosie Poisson, MD [phone]

## 2018-08-24 NOTE — ED Notes (Signed)
Report given to Bee rn

## 2018-08-25 DIAGNOSIS — Z791 Long term (current) use of non-steroidal anti-inflammatories (NSAID): Secondary | ICD-10-CM | POA: Diagnosis not present

## 2018-08-25 DIAGNOSIS — J4541 Moderate persistent asthma with (acute) exacerbation: Secondary | ICD-10-CM | POA: Diagnosis present

## 2018-08-25 DIAGNOSIS — Z8052 Family history of malignant neoplasm of bladder: Secondary | ICD-10-CM | POA: Diagnosis not present

## 2018-08-25 DIAGNOSIS — I340 Nonrheumatic mitral (valve) insufficiency: Secondary | ICD-10-CM | POA: Diagnosis not present

## 2018-08-25 DIAGNOSIS — E669 Obesity, unspecified: Secondary | ICD-10-CM | POA: Diagnosis present

## 2018-08-25 DIAGNOSIS — Z801 Family history of malignant neoplasm of trachea, bronchus and lung: Secondary | ICD-10-CM | POA: Diagnosis not present

## 2018-08-25 DIAGNOSIS — Z9049 Acquired absence of other specified parts of digestive tract: Secondary | ICD-10-CM | POA: Diagnosis not present

## 2018-08-25 DIAGNOSIS — Z8249 Family history of ischemic heart disease and other diseases of the circulatory system: Secondary | ICD-10-CM | POA: Diagnosis not present

## 2018-08-25 DIAGNOSIS — Z825 Family history of asthma and other chronic lower respiratory diseases: Secondary | ICD-10-CM | POA: Diagnosis not present

## 2018-08-25 DIAGNOSIS — Z9851 Tubal ligation status: Secondary | ICD-10-CM | POA: Diagnosis not present

## 2018-08-25 DIAGNOSIS — Z79891 Long term (current) use of opiate analgesic: Secondary | ICD-10-CM | POA: Diagnosis not present

## 2018-08-25 DIAGNOSIS — J209 Acute bronchitis, unspecified: Secondary | ICD-10-CM | POA: Diagnosis present

## 2018-08-25 DIAGNOSIS — Z79899 Other long term (current) drug therapy: Secondary | ICD-10-CM | POA: Diagnosis not present

## 2018-08-25 DIAGNOSIS — Z6841 Body Mass Index (BMI) 40.0 and over, adult: Secondary | ICD-10-CM | POA: Diagnosis not present

## 2018-08-25 DIAGNOSIS — D500 Iron deficiency anemia secondary to blood loss (chronic): Secondary | ICD-10-CM | POA: Diagnosis present

## 2018-08-25 DIAGNOSIS — T380X5A Adverse effect of glucocorticoids and synthetic analogues, initial encounter: Secondary | ICD-10-CM | POA: Diagnosis present

## 2018-08-25 DIAGNOSIS — Z833 Family history of diabetes mellitus: Secondary | ICD-10-CM | POA: Diagnosis not present

## 2018-08-25 DIAGNOSIS — R51 Headache: Secondary | ICD-10-CM | POA: Diagnosis present

## 2018-08-25 DIAGNOSIS — R0602 Shortness of breath: Secondary | ICD-10-CM | POA: Diagnosis present

## 2018-08-25 DIAGNOSIS — R0902 Hypoxemia: Secondary | ICD-10-CM | POA: Diagnosis present

## 2018-08-25 DIAGNOSIS — E876 Hypokalemia: Secondary | ICD-10-CM | POA: Diagnosis present

## 2018-08-25 DIAGNOSIS — J181 Lobar pneumonia, unspecified organism: Secondary | ICD-10-CM | POA: Diagnosis present

## 2018-08-25 DIAGNOSIS — Z23 Encounter for immunization: Secondary | ICD-10-CM | POA: Diagnosis not present

## 2018-08-25 LAB — D-DIMER, QUANTITATIVE: D-Dimer, Quant: 0.33 ug/mL-FEU (ref 0.00–0.50)

## 2018-08-25 LAB — BASIC METABOLIC PANEL
Anion gap: 6 (ref 5–15)
BUN: 15 mg/dL (ref 6–20)
CALCIUM: 9.1 mg/dL (ref 8.9–10.3)
CO2: 22 mmol/L (ref 22–32)
Chloride: 111 mmol/L (ref 98–111)
Creatinine, Ser: 0.76 mg/dL (ref 0.44–1.00)
GLUCOSE: 155 mg/dL — AB (ref 70–99)
Potassium: 4.5 mmol/L (ref 3.5–5.1)
Sodium: 139 mmol/L (ref 135–145)

## 2018-08-25 LAB — FERRITIN: Ferritin: 25 ng/mL (ref 11–307)

## 2018-08-25 LAB — IRON AND TIBC
Iron: 78 ug/dL (ref 28–170)
SATURATION RATIOS: 22 % (ref 10.4–31.8)
TIBC: 350 ug/dL (ref 250–450)
UIBC: 272 ug/dL

## 2018-08-25 LAB — CBC
HCT: 33.9 % — ABNORMAL LOW (ref 36.0–46.0)
Hemoglobin: 10.2 g/dL — ABNORMAL LOW (ref 12.0–15.0)
MCH: 22.9 pg — ABNORMAL LOW (ref 26.0–34.0)
MCHC: 30.1 g/dL (ref 30.0–36.0)
MCV: 76.2 fL — AB (ref 80.0–100.0)
NRBC: 0 % (ref 0.0–0.2)
PLATELETS: 234 10*3/uL (ref 150–400)
RBC: 4.45 MIL/uL (ref 3.87–5.11)
RDW: 13.2 % (ref 11.5–15.5)
WBC: 19.6 10*3/uL — ABNORMAL HIGH (ref 4.0–10.5)

## 2018-08-25 LAB — RETICULOCYTES
Immature Retic Fract: 23.7 % — ABNORMAL HIGH (ref 2.3–15.9)
RBC.: 4.73 MIL/uL (ref 3.87–5.11)
Retic Count, Absolute: 89.4 10*3/uL (ref 19.0–186.0)
Retic Ct Pct: 1.9 % (ref 0.4–3.1)

## 2018-08-25 LAB — INFLUENZA PANEL BY PCR (TYPE A & B)
Influenza A By PCR: NEGATIVE
Influenza B By PCR: NEGATIVE

## 2018-08-25 LAB — FOLATE: Folate: 11.4 ng/mL (ref >5.9)

## 2018-08-25 LAB — VITAMIN B12: Vitamin B-12: 423 pg/mL (ref 180–914)

## 2018-08-25 MED ORDER — DOCUSATE SODIUM 100 MG PO CAPS
100.0000 mg | ORAL_CAPSULE | Freq: Two times a day (BID) | ORAL | Status: DC
  Administered 2018-08-25 – 2018-08-28 (×6): 100 mg via ORAL
  Filled 2018-08-25 (×6): qty 1

## 2018-08-25 MED ORDER — BENZONATATE 100 MG PO CAPS: 200.0000 mg | ORAL_CAPSULE | Freq: Three times a day (TID) | ORAL | Status: DC | PRN

## 2018-08-25 MED ORDER — AZITHROMYCIN 500 MG PO TABS
500.0000 mg | ORAL_TABLET | Freq: Every day | ORAL | Status: DC
  Administered 2018-08-25 – 2018-08-27 (×3): 500 mg via ORAL
  Filled 2018-08-25 (×3): qty 1

## 2018-08-25 MED ORDER — LORAZEPAM 1 MG PO TABS
1.0000 mg | ORAL_TABLET | Freq: Three times a day (TID) | ORAL | Status: DC | PRN
  Administered 2018-08-25 – 2018-08-26 (×2): 1 mg via ORAL
  Filled 2018-08-25 (×2): qty 1

## 2018-08-25 NOTE — Progress Notes (Signed)
PROGRESS NOTE    [name]  PPI:[id number] DOB: [birth year] DOA: 08/24/2018 PCP: Patient, No Pcp Per    Brief Narrative:   [name] a 42 y.o.femalewith asthma (intermittent, per family) who presents with acute SOB and wheezing after being at a house party where there was smoking indoors. Symptoms began acutely starting around 1AM with wheezing and coughing, associated chest tightness was admitted for acute asthma exacerbation.   Assessment & Plan:   Active Problems:   Asthma exacerbation   Acute asthma exacerbation:  - started on IV solumedrol 60 mg every 6 hours, duonebs every 6 hours.  She continues to require 2 lit  [place] oxygen to keep sats greater than 90%. RN reported that they tried to wean her off the oxygen and patient couldn't tolerate and she became hypoxic. Will continue IV solumedrol, and add d dimer to the labs.  If elevated , will check CT angio of the chest to rule out PE.  On exam today, pt continues to have bilateral diffuse both exp and insp wheezing and coughing while talking.  Azithromycin for acute bronchitis. Check for influenza.    leukocytosis from steroids.    Hypokalemia replaced.    Microcytic anemia:  Anemia panel will be sent for further evaluation. Monitor H&H   DVT prophylaxis: lovenox.  Code Status: full code.  Family Communication: family at bedside.  Disposition Plan: pending clinical improvement.  Consultants:   None.   Procedures: none.  Antimicrobials: azithromycin.   Subjective: She is anxious, teary, and still very sob, coughing while talking and diffusely wheezing.   Objective: Vitals:   08/24/18 1617 08/24/18 2101 08/25/18 0539 08/25/18 0824  BP:  (!) 141/79 122/78   Pulse:  (!) 105 74 89  Resp:    18  Temp:  99 F (37.2 C) 98.8 F (37.1 C)   TempSrc:      SpO2: 95% 99% 100% 100%  Weight:      Height:       No intake or output data in the 24 hours ending 08/25/18 1310 Filed Weights   08/24/18  1607  Weight: 114.4 kg    Examination:  General exam: Appears mild distress  Respiratory system: bilateral wheezing heard both insp and exp and anteriorly and posteriorly.  Cardiovascular system: S1 & S2 heard, tachycardic, No pedal edema. Gastrointestinal system: Abdomen is nondistended, soft and nontender. No organomegaly or masses felt. Normal bowel sounds heard. Central nervous system: Alert and oriented. No focal neurological deficits. Extremities: Symmetric 5 x 5 power. Skin: No rashes, lesions or ulcers Psychiatry: anxious and teary.     Data Reviewed: I have personally reviewed following labs and imaging studies  CBC: Recent Labs  Lab 08/24/18 0200 08/24/18 0450 08/25/18 0430  WBC 9.3 9.2 19.6*  NEUTROABS 4.3  --   --   HGB 11.1* 10.3* 10.2*  HCT 39.1 34.6* 33.9*  MCV 78.8* 76.9* 76.2*  PLT 224 229 063   Basic Metabolic Panel: Recent Labs  Lab 08/24/18 0200 08/24/18 0450 08/25/18 0430  NA 139  --  139  K 3.3*  --  4.5  CL 104  --  111  CO2 20*  --  22  GLUCOSE 104*  --  155*  BUN 10  --  15  CREATININE 0.89 1.00 0.76  CALCIUM 9.1  --  9.1   GFR: Estimated Creatinine Clearance: 117.5 mL/min (by C-G formula based on SCr of 0.76 mg/dL). Liver Function Tests: No results for input(s): AST,  ALT, ALKPHOS, BILITOT, PROT, ALBUMIN in the last 168 hours. No results for input(s): LIPASE, AMYLASE in the last 168 hours. No results for input(s): AMMONIA in the last 168 hours. Coagulation Profile: No results for input(s): INR, PROTIME in the last 168 hours. Cardiac Enzymes: No results for input(s): CKTOTAL, CKMB, CKMBINDEX, TROPONINI in the last 168 hours. BNP (last 3 results) No results for input(s): PROBNP in the last 8760 hours. HbA1C: No results for input(s): HGBA1C in the last 72 hours. CBG: No results for input(s): GLUCAP in the last 168 hours. Lipid Profile: No results for input(s): CHOL, HDL, LDLCALC, TRIG, CHOLHDL, LDLDIRECT in the last 72  hours. Thyroid Function Tests: No results for input(s): TSH, T4TOTAL, FREET4, T3FREE, THYROIDAB in the last 72 hours. Anemia Panel: No results for input(s): VITAMINB12, FOLATE, FERRITIN, TIBC, IRON, RETICCTPCT in the last 72 hours. Sepsis Labs: No results for input(s): PROCALCITON, LATICACIDVEN in the last 168 hours.  No results found for this or any previous visit (from the past 240 hour(s)).       Radiology Studies: Dg Chest Port 1 View  Result Date: 08/24/2018 CLINICAL DATA:  42 year old female with shortness of breath. EXAM: PORTABLE CHEST 1 VIEW COMPARISON:  Chest radiograph dated 08/31/2015 FINDINGS: The heart size and mediastinal contours are within normal limits. Both lungs are clear. The visualized skeletal structures are unremarkable. IMPRESSION: No active disease. Electronically Signed   By: Anner Crete M.D.   On: 08/24/2018 02:19        Scheduled Meds: . enoxaparin (LOVENOX) injection  40 mg Subcutaneous Daily  . fluticasone furoate-vilanterol  1 puff Inhalation Daily  . Influenza vac split quadrivalent PF  0.5 mL Intramuscular Tomorrow-1000  . ipratropium-albuterol  3 mL Nebulization Q6H  . methylPREDNISolone (SOLU-MEDROL) injection  60 mg Intravenous Q6H   Continuous Infusions:   LOS: 0 days    Time spent: 35 minutes    Hosie Poisson, MD [facility] Pager [phone]  If 7PM-7AM, please contact night-coverage [email or web address] Password [email or web address] 08/25/2018, 1:10 PM

## 2018-08-25 NOTE — Care Management Note (Signed)
Case Management Note  Patient Details  Name: KEMARA QUIGLEY MRN: [id number] Date of Birth: July 28, 1976  Subjective/Objective:   Asthma Exacerbation                Action/Plan: Patient is independent prior to admission; PCP is Dr Doristine Section Bonsu; has private insurance with BCBS with prescription drug coverage; pharmacy of choice is [facility] on [address]; CM will continue to follow for progression of care.  Expected Discharge Date:    possibly 08/30/2018              Expected Discharge Plan:  Home/Self Care  Discharge planning Services  CM Consult  Status of Service:  In process, will continue to follow  Sherrilyn Rist [phone] 08/25/2018, 10:02 AM

## 2018-08-26 ENCOUNTER — Encounter (HOSPITAL_COMMUNITY): Payer: Self-pay | Admitting: General Practice

## 2018-08-26 ENCOUNTER — Other Ambulatory Visit: Payer: Self-pay

## 2018-08-26 LAB — CBC
HCT: 33.2 % — ABNORMAL LOW (ref 36.0–46.0)
HEMOGLOBIN: 10 g/dL — AB (ref 12.0–15.0)
MCH: 23.1 pg — ABNORMAL LOW (ref 26.0–34.0)
MCHC: 30.1 g/dL (ref 30.0–36.0)
MCV: 76.7 fL — ABNORMAL LOW (ref 80.0–100.0)
NRBC: 0 % (ref 0.0–0.2)
Platelets: 244 10*3/uL (ref 150–400)
RBC: 4.33 MIL/uL (ref 3.87–5.11)
RDW: 13.2 % (ref 11.5–15.5)
WBC: 19.2 10*3/uL — AB (ref 4.0–10.5)

## 2018-08-26 MED ORDER — AZITHROMYCIN 500 MG PO TABS: 500.0000 mg | ORAL_TABLET | Freq: Every day | ORAL | 0 refills | Status: DC

## 2018-08-26 MED ORDER — IPRATROPIUM-ALBUTEROL 0.5-2.5 (3) MG/3ML IN SOLN: 3.0000 mL | Freq: Four times a day (QID) | RESPIRATORY_TRACT | 2 refills | Status: DC | PRN

## 2018-08-26 MED ORDER — GUAIFENESIN-DM 100-10 MG/5ML PO SYRP: 5.0000 mL | ORAL_SOLUTION | ORAL | 0 refills | Status: DC | PRN

## 2018-08-26 MED ORDER — IPRATROPIUM-ALBUTEROL 0.5-2.5 (3) MG/3ML IN SOLN
3.0000 mL | Freq: Two times a day (BID) | RESPIRATORY_TRACT | Status: DC
  Administered 2018-08-26 – 2018-08-28 (×4): 3 mL via RESPIRATORY_TRACT
  Filled 2018-08-26 (×4): qty 3

## 2018-08-26 MED ORDER — PREDNISONE 20 MG PO TABS: ORAL_TABLET | ORAL | 0 refills | Status: DC

## 2018-08-26 MED ORDER — BENZONATATE 200 MG PO CAPS: 200.0000 mg | ORAL_CAPSULE | Freq: Three times a day (TID) | ORAL | 0 refills | Status: DC | PRN

## 2018-08-26 MED FILL — predniSONE 20 MG TABS: 20 | 9 days supply | Qty: 18 | Fill #0

## 2018-08-26 NOTE — Progress Notes (Addendum)
Discharge Planning: NCM spoke to pt and she has neb machine at home. Pt may need oxygen for home. Unit RN attempting to wean. Please see previous NCM notes. Arranged appt with Dr. Cristi Loron, PCP on Sep 03, 2018 at 230 pm. Pt will need a note for work. Jonnie Finner RN CCM Case Mgmt phone [phone]

## 2018-08-26 NOTE — Progress Notes (Signed)
Upon weaning patient off O2, patient became tachypneic in the 40's and heart rate jumped to 140's-150's. Patient placed back on 1L [place]. Spoke with Dr. Karleen Hampshire and instructed to attempt weaning again around 1200 for possible discharge later today.

## 2018-08-26 NOTE — Progress Notes (Signed)
Patient sitting on edge of bed with increased WOB and inability to catch her breath. Patient was on 1L [place] but had to increase to 2L [place]. Will continue to monitor.

## 2018-08-26 NOTE — Progress Notes (Signed)
Removed both IV assess per pt c/o of pain and discomfort on site. Pt tolerated well with site clean, dry with no redness. New Iv site place by IV team.

## 2018-08-26 NOTE — Progress Notes (Signed)
PROGRESS NOTE    [name]  QMG:[id number] DOB: 04/11/1976 DOA: 08/24/2018 PCP: Audley Hose, MD    Brief Narrative:   [name] a 42 y.o.femalewith asthma (intermittent, per family) who presents with acute SOB and wheezing after being at a house party where there was smoking indoors. Symptoms began acutely starting around 1AM with wheezing and coughing, associated chest tightness was admitted for acute asthma exacerbation.   Assessment & Plan:   Active Problems:   Asthma exacerbation   Acute asthma exacerbation:  - started on IV solumedrol 60 mg every 6 hours, duonebs every 6 hours.  She continues to require 2 lit  [place] oxygen to keep sats greater than 90%.  We tried to wean her off the oxygen but patient could tolerate and her sats dropped again.  We will try to slowly come down to 1 L of nasal cannula to see if she can tolerate.  For now continue with IV steroids Solu-Medrol.  D-dimer obtained its within normal limits.  On exam today, pt wheezing has improved but she is not back to baseline. Azithromycin for acute bronchitis.  Plans a PCR is negative   leukocytosis from steroids.    Hypokalemia replaced.    Microcytic anemia:  Anemia panel will be sent for further evaluation. anemia panel shows normal folate vitamin B12 IM levels and ferritin.  Globin stable around 10.   DVT prophylaxis: lovenox.  Code Status: full code.  Family Communication: family at bedside.  Disposition Plan: pending clinical improvement.  Consultants:   None.   Procedures: none.  Antimicrobials: azithromycin.   Subjective: [name] is more calm today but continues to cough and has scattered wheezing bilaterally.  Objective: Vitals:   08/26/18 0939 08/26/18 0952 08/26/18 1136 08/26/18 1406  BP:    112/67  Pulse:    82  Resp:    18  Temp:    98.9 F (37.2 C)  TempSrc:      SpO2: (!) 86% 91% 98% 100%  Weight:      Height:        Intake/Output Summary (Last 24  hours) at 08/26/2018 1415 Last data filed at 08/26/2018 1013 Gross per 24 hour  Intake 300 ml  Output -  Net 300 ml   Filed Weights   08/24/18 1607  Weight: 114.4 kg    Examination:  General exam: Not in any kind of distress but coughing while she is talking Respiratory system: Wheezing has improved air entry better than yesterday.  No rhonchi Cardiovascular system: S1 & S2 heard, regular rate rhythm no pedal edema. Gastrointestinal system: Abdomen is soft, nontender, nondistended with good bowel sounds  Central nervous system: Alert and oriented.  Nonfocal Extremities: Symmetric 5 x 5 power.  No cyanosis or clubbing, no pedal edema Skin: No rashes, lesions or ulcers Psychiatry: Mood appropriate    Data Reviewed: I have personally reviewed following labs and imaging studies  CBC: Recent Labs  Lab 08/24/18 0200 08/24/18 0450 08/25/18 0430 08/26/18 0514  WBC 9.3 9.2 19.6* 19.2*  NEUTROABS 4.3  --   --   --   HGB 11.1* 10.3* 10.2* 10.0*  HCT 39.1 34.6* 33.9* 33.2*  MCV 78.8* 76.9* 76.2* 76.7*  PLT 224 229 234 326   Basic Metabolic Panel: Recent Labs  Lab 08/24/18 0200 08/24/18 0450 08/25/18 0430  NA 139  --  139  K 3.3*  --  4.5  CL 104  --  111  CO2 20*  --  22  GLUCOSE 104*  --  155*  BUN 10  --  15  CREATININE 0.89 1.00 0.76  CALCIUM 9.1  --  9.1   GFR: Estimated Creatinine Clearance: 117.5 mL/min (by C-G formula based on SCr of 0.76 mg/dL). Liver Function Tests: No results for input(s): AST, ALT, ALKPHOS, BILITOT, PROT, ALBUMIN in the last 168 hours. No results for input(s): LIPASE, AMYLASE in the last 168 hours. No results for input(s): AMMONIA in the last 168 hours. Coagulation Profile: No results for input(s): INR, PROTIME in the last 168 hours. Cardiac Enzymes: No results for input(s): CKTOTAL, CKMB, CKMBINDEX, TROPONINI in the last 168 hours. BNP (last 3 results) No results for input(s): PROBNP in the last 8760 hours. HbA1C: No results for  input(s): HGBA1C in the last 72 hours. CBG: No results for input(s): GLUCAP in the last 168 hours. Lipid Profile: No results for input(s): CHOL, HDL, LDLCALC, TRIG, CHOLHDL, LDLDIRECT in the last 72 hours. Thyroid Function Tests: No results for input(s): TSH, T4TOTAL, FREET4, T3FREE, THYROIDAB in the last 72 hours. Anemia Panel: Recent Labs    08/25/18 1430  VITAMINB12 423  FOLATE 11.4  FERRITIN 25  TIBC 350  IRON 78  RETICCTPCT 1.9   Sepsis Labs: No results for input(s): PROCALCITON, LATICACIDVEN in the last 168 hours.  No results found for this or any previous visit (from the past 240 hour(s)).       Radiology Studies: No results found.      Scheduled Meds: . azithromycin  500 mg Oral Daily  . docusate sodium  100 mg Oral BID  . enoxaparin (LOVENOX) injection  40 mg Subcutaneous Daily  . fluticasone furoate-vilanterol  1 puff Inhalation Daily  . Influenza vac split quadrivalent PF  0.5 mL Intramuscular Tomorrow-1000  . ipratropium-albuterol  3 mL Nebulization BID  . methylPREDNISolone (SOLU-MEDROL) injection  60 mg Intravenous Q6H   Continuous Infusions:   LOS: 1 day    Time spent: 28 minutes    Hosie Poisson, MD [facility] Pager [phone]  If 7PM-7AM, please contact night-coverage [email or web address] Password [email or web address] 08/26/2018, 2:15 PM

## 2018-08-27 ENCOUNTER — Inpatient Hospital Stay (HOSPITAL_COMMUNITY): Payer: BLUE CROSS/BLUE SHIELD

## 2018-08-27 DIAGNOSIS — I340 Nonrheumatic mitral (valve) insufficiency: Secondary | ICD-10-CM

## 2018-08-27 DIAGNOSIS — J181 Lobar pneumonia, unspecified organism: Secondary | ICD-10-CM

## 2018-08-27 LAB — ECHOCARDIOGRAM COMPLETE
Height: 66.5 in
Weight: 4035.3 oz

## 2018-08-27 MED ORDER — HYDROCORTISONE 1 % EX CREA
TOPICAL_CREAM | Freq: Two times a day (BID) | CUTANEOUS | Status: DC | PRN
  Administered 2018-08-28: 05:00:00 via TOPICAL
  Filled 2018-08-27: qty 28

## 2018-08-27 MED ORDER — PERFLUTREN LIPID MICROSPHERE
1.0000 mL | INTRAVENOUS | Status: AC | PRN
  Administered 2018-08-27: 10 mL via INTRAVENOUS
  Filled 2018-08-27: qty 10

## 2018-08-27 MED ORDER — POLYETHYLENE GLYCOL 3350 17 G PO PACK
17.0000 g | PACK | Freq: Two times a day (BID) | ORAL | Status: DC
  Administered 2018-08-28 (×2): 17 g via ORAL
  Filled 2018-08-27 (×2): qty 1

## 2018-08-27 MED ORDER — AZITHROMYCIN 500 MG PO TABS
500.0000 mg | ORAL_TABLET | Freq: Every day | ORAL | Status: DC
  Administered 2018-08-28: 500 mg via ORAL
  Filled 2018-08-27: qty 1

## 2018-08-27 MED ORDER — SODIUM CHLORIDE 0.9 % IV SOLN
2.0000 g | INTRAVENOUS | Status: DC
  Administered 2018-08-28: 2 g via INTRAVENOUS
  Filled 2018-08-27 (×2): qty 20

## 2018-08-27 MED ORDER — METHYLPREDNISOLONE SODIUM SUCC 125 MG IJ SOLR
60.0000 mg | INTRAMUSCULAR | Status: DC
  Administered 2018-08-28: 60 mg via INTRAVENOUS
  Filled 2018-08-27: qty 2

## 2018-08-27 NOTE — Progress Notes (Signed)
PROGRESS NOTE    [name]  TDV:[id number] DOB: [birth year] DOA: 08/24/2018 PCP: Audley Hose, MD    Brief Narrative:   [name] a 42 y.o.femalewith asthma (intermittent, per family) who presents with acute SOB and wheezing after being at a house party where there was smoking indoors. Symptoms began acutely starting around 1AM with wheezing and coughing, associated chest tightness was admitted for acute asthma exacerbation.   Assessment & Plan:   Active Problems:   Asthma exacerbation   Acute asthma exacerbation with superimposed  Bilateral pneumonia.   - started on IV solumedrol 60 mg every 6 hours, duonebs every 6 hours her wheezing has improved and started weaning her oxygen, since She continues to require 2 lit  [place] oxygen to keep sats greater than 90%.We will try to slowly come down to 1 L of nasal cannula to see if she can tolerate.  D-dimer obtained its within normal limits. Repeat CXR showed possible pneumonia. Started her on iv rocephin. Influenza PCR is negative.    leukocytosis from steroids.    Hypokalemia replaced.    Microcytic anemia:  Anemia panel will be sent for further evaluation. anemia panel shows normal folate vitamin B12 IM levels and ferritin.  HemoGlobin stable around 10.   DVT prophylaxis: lovenox.  Code Status: full code.  Family Communication: none at bedside.  Disposition Plan: pending clinical improvement.  Consultants:   None.   Procedures: none.  Antimicrobials: azithromycin.   Subjective: Does nt appear to feel better. Still on 2 lit of [place] Oxygen.   Objective: Vitals:   08/26/18 2112 08/26/18 2132 08/27/18 0559 08/27/18 0746  BP: 121/76  136/64   Pulse: 90  (!) 48   Resp: 16  16   Temp: 98.9 F (37.2 C)  98.2 F (36.8 C)   TempSrc: Oral  Oral   SpO2: 100% 100% 100% 99%  Weight:      Height:        Intake/Output Summary (Last 24 hours) at 08/27/2018 1301 Last data filed at 08/26/2018 1534 Gross per  24 hour  Intake 200 ml  Output -  Net 200 ml   Filed Weights   08/24/18 1607  Weight: 114.4 kg    Examination:  General exam: not in distress on 2 lit of [place] oxygen.  Respiratory system: Wheezing has improved, diminished air entry at bases.  Cardiovascular system: S1 & S2 heard, regular rate rhythm no pedal edema. Gastrointestinal system: Abdomen is soft NT ND BS+  Central nervous system: Alert and oriented.  Nonfocal Extremities: Symmetric 5 x 5 power.  No cyanosis or clubbing, no pedal edema Skin: No rashes, lesions or ulcers Psychiatry: Mood appropriate    Data Reviewed: I have personally reviewed following labs and imaging studies  CBC: Recent Labs  Lab 08/24/18 0200 08/24/18 0450 08/25/18 0430 08/26/18 0514  WBC 9.3 9.2 19.6* 19.2*  NEUTROABS 4.3  --   --   --   HGB 11.1* 10.3* 10.2* 10.0*  HCT 39.1 34.6* 33.9* 33.2*  MCV 78.8* 76.9* 76.2* 76.7*  PLT 224 229 234 062   Basic Metabolic Panel: Recent Labs  Lab 08/24/18 0200 08/24/18 0450 08/25/18 0430  NA 139  --  139  K 3.3*  --  4.5  CL 104  --  111  CO2 20*  --  22  GLUCOSE 104*  --  155*  BUN 10  --  15  CREATININE 0.89 1.00 0.76  CALCIUM 9.1  --  9.1  GFR: Estimated Creatinine Clearance: 117.5 mL/min (by C-G formula based on SCr of 0.76 mg/dL). Liver Function Tests: No results for input(s): AST, ALT, ALKPHOS, BILITOT, PROT, ALBUMIN in the last 168 hours. No results for input(s): LIPASE, AMYLASE in the last 168 hours. No results for input(s): AMMONIA in the last 168 hours. Coagulation Profile: No results for input(s): INR, PROTIME in the last 168 hours. Cardiac Enzymes: No results for input(s): CKTOTAL, CKMB, CKMBINDEX, TROPONINI in the last 168 hours. BNP (last 3 results) No results for input(s): PROBNP in the last 8760 hours. HbA1C: No results for input(s): HGBA1C in the last 72 hours. CBG: No results for input(s): GLUCAP in the last 168 hours. Lipid Profile: No results for input(s):  CHOL, HDL, LDLCALC, TRIG, CHOLHDL, LDLDIRECT in the last 72 hours. Thyroid Function Tests: No results for input(s): TSH, T4TOTAL, FREET4, T3FREE, THYROIDAB in the last 72 hours. Anemia Panel: Recent Labs    08/25/18 1430  VITAMINB12 423  FOLATE 11.4  FERRITIN 25  TIBC 350  IRON 78  RETICCTPCT 1.9   Sepsis Labs: No results for input(s): PROCALCITON, LATICACIDVEN in the last 168 hours.  No results found for this or any previous visit (from the past 240 hour(s)).       Radiology Studies: Dg Chest Port 1 View  Result Date: 08/27/2018 CLINICAL DATA:  Shortness of breath; history of asthma, nonsmoker. EXAM: PORTABLE CHEST 1 VIEW COMPARISON:  Portable chest x-ray of August 24, 2018. FINDINGS: The lungs are adequately inflated. There are increased lung markings in the right infrahilar and left infrahilar regions today which have appeared since yesterday's study. There is no pleural effusion or pneumothorax. The heart is top-normal in size. The pulmonary vascularity is normal. IMPRESSION: Interval development of subsegmental atelectasis or early pneumonia in the infrahilar regions bilaterally. If the patient can tolerate the procedure, a PA and lateral chest x-ray would be useful. Electronically Signed   By: David  Martinique M.D.   On: 08/27/2018 12:18        Scheduled Meds: . azithromycin  500 mg Oral Daily  . docusate sodium  100 mg Oral BID  . enoxaparin (LOVENOX) injection  40 mg Subcutaneous Daily  . fluticasone furoate-vilanterol  1 puff Inhalation Daily  . Influenza vac split quadrivalent PF  0.5 mL Intramuscular Tomorrow-1000  . ipratropium-albuterol  3 mL Nebulization BID  . [START ON 08/28/2018] methylPREDNISolone (SOLU-MEDROL) injection  60 mg Intravenous Q24H   Continuous Infusions: . [START ON 08/28/2018] cefTRIAXone (ROCEPHIN)  IV       LOS: 2 days    Time spent: 29 minutes    Hosie Poisson, MD [facility] Pager [phone]  If 7PM-7AM, please  contact night-coverage [email or web address] Password [email or web address] 08/27/2018, 1:01 PM

## 2018-08-27 NOTE — Procedures (Signed)
Echo attempted. Patient sitting with garbage can at side of bed spitting up and/or vomiting. Patient requested that later attempt be made.

## 2018-08-28 LAB — CBC
HEMATOCRIT: 31.8 % — AB (ref 36.0–46.0)
Hemoglobin: 9.6 g/dL — ABNORMAL LOW (ref 12.0–15.0)
MCH: 23.2 pg — ABNORMAL LOW (ref 26.0–34.0)
MCHC: 30.2 g/dL (ref 30.0–36.0)
MCV: 77 fL — AB (ref 80.0–100.0)
NRBC: 0.2 % (ref 0.0–0.2)
PLATELETS: 206 10*3/uL (ref 150–400)
RBC: 4.13 MIL/uL (ref 3.87–5.11)
RDW: 13 % (ref 11.5–15.5)
WBC: 13.1 10*3/uL — ABNORMAL HIGH (ref 4.0–10.5)

## 2018-08-28 LAB — BASIC METABOLIC PANEL
ANION GAP: 9 (ref 5–15)
BUN: 18 mg/dL (ref 6–20)
CO2: 21 mmol/L — ABNORMAL LOW (ref 22–32)
CREATININE: 0.88 mg/dL (ref 0.44–1.00)
Calcium: 7.8 mg/dL — ABNORMAL LOW (ref 8.9–10.3)
Chloride: 109 mmol/L (ref 98–111)
GFR calc Af Amer: >60 mL/min (ref >60)
Glucose, Bld: 118 mg/dL — ABNORMAL HIGH (ref 70–99)
POTASSIUM: 3.9 mmol/L (ref 3.5–5.1)
Sodium: 139 mmol/L (ref 135–145)

## 2018-08-28 MED ORDER — HYDROCORTISONE 1 % EX CREA: TOPICAL_CREAM | Freq: Two times a day (BID) | CUTANEOUS | 0 refills | Status: DC | PRN

## 2018-08-28 MED ORDER — AMOXICILLIN-POT CLAVULANATE 875-125 MG PO TABS: 1.0000 | ORAL_TABLET | Freq: Two times a day (BID) | ORAL | 0 refills | Status: AC

## 2018-08-28 MED FILL — Perflutren Lipid Microsphere IV Susp 1.1 MG/ML: INTRAVENOUS | Qty: 10 | Status: AC

## 2018-08-28 NOTE — Plan of Care (Signed)

## 2018-08-28 NOTE — Care Plan (Signed)
SATURATION QUALIFICATIONS: (This note is used to comply with regulatory documentation for home oxygen)  Patient Saturations on Room Air at Rest = 100%  Patient Saturations on Room Air while Ambulating = 75%  Patient Saturations on 2 Liters of oxygen while Ambulating = 88%  Please briefly explain why patient needs home oxygen: Desaturations with ambulation.

## 2018-08-28 NOTE — Care Management Note (Signed)
Case Management Note  Patient Details  Name: [name] MRN: [id number] Date of Birth: 31-Jan-1976  Subjective/Objective:                Asthma excerbation   [name] (Sister)       [phone]           Action/Plan: Transition to home today with oxygen provided per [facility]. Portable tank will be delivered to bedside prior to d/c for transportation to home. Pt has transportation to home.  Expected Discharge Date:  08/28/18               Expected Discharge Plan:  Home/Self Care  In-House Referral:  NA  Discharge planning Services  CM Consult  Post Acute Care Choice:  NA Choice offered to:  Patient  DME Arranged:  Oxygen DME Agency:  [facility]  HH Arranged:  NA HH Agency:  NA  Status of Service:  Completed, signed off  If discussed at [place] of Stay Meetings, dates discussed:    Additional Comments:  Sharin Mons, RN 08/28/2018, 12:06 PM

## 2018-09-03 NOTE — Discharge Summary (Signed)
Physician Discharge Summary  [name] GBT:[id number] DOB: 03/17/1976 DOA: 08/24/2018  PCP: Audley Hose, MD  Admit date: 08/24/2018 Discharge date: 08/28/2018  Admitted From:Home.  Disposition:  Home.   Recommendations for Outpatient Follow-up:  1. Follow up with PCP in 1-2 weeks 2. Please obtain BMP/CBC in one week 3. Please follow up with cardiology in 1 to 2 weeks for diastolic dysfunction on Echo.   Home Health:yes.  Equipment/Devices: 2 lit [place] [employer]xygen.   Discharge Condition:stable.  CODE STATUS: full code.  Diet recommendation: Heart Healthy  Brief/Interim Summary: [name] a 42 y.o.femalewith asthma (intermittent, per family) who presents with acute SOB and wheezing after being at a house party where there was smoking indoors. Symptoms began acutely starting around 1AM with wheezing and coughing, associated chest tightnesswas admitted for acute asthma exacerbation.  Discharge Diagnoses:  Active Problems:   Asthma exacerbation   Lobar pneumonia ([place])  Acute asthma exacerbation with superimposed  Bilateral pneumonia.   - started on IV solumedrol 60 mg every 6 hours, duonebs every 6 hours her wheezing has improved and started weaning her oxygen, since She continues to require 2 lit  [place] oxygen to keep sats greater than 90%.  D-dimer obtained its within normal limits. Repeat CXR showed possible pneumonia.  Added IV rocephin to the zithromax. Influenza PCR is negative. discharged her on oral antibiotics to complete the course.  Recommend getting a CXR IN 4 weeks to evaluate for resolution of the pneumonia.     leukocytosis from steroids.    Hypokalemia replaced.    Microcytic anemia:  Anemia panel  sent for further evaluation. anemia panel shows normal folate vitamin B12 IM levels and ferritin.  HemoGlobin stable around 10. Recommend outpatient follow up.    Discharge Instructions  Discharge Instructions    Diet - low sodium heart healthy    Complete by:  As directed    Diet - low sodium heart healthy   Complete by:  As directed    Discharge instructions   Complete by:  As directed    Follow up with PCP in one week.   Discharge instructions   Complete by:  As directed    Follow up with PCP IN ONE WEEK.  please follow up with cardiology as recommended for diastolic heart failure.     Allergies as of 08/28/2018   No Known Allergies     Medication List    STOP taking these medications   HYDROcodone-acetaminophen 5-325 MG tablet Commonly known as:  NORCO/VICODIN   ibuprofen 200 MG tablet Commonly known as:  ADVIL,MOTRIN   indomethacin 25 MG capsule Commonly known as:  INDOCIN   medroxyPROGESTERone 10 MG tablet Commonly known as:  PROVERA   meloxicam 7.5 MG tablet Commonly known as:  MOBIC     TAKE these medications   albuterol 108 (90 Base) MCG/ACT inhaler Commonly known as:  PROVENTIL HFA;VENTOLIN HFA Inhale 2 puffs into the lungs every 4 (four) hours as needed for wheezing. wheezing What changed:    additional instructions  Another medication with the same name was removed. Continue taking this medication, and follow the directions you see here.   benzonatate 200 MG capsule Commonly known as:  TESSALON Take 1 capsule (200 mg total) by mouth 3 (three) times daily as needed for cough.   famotidine 20 MG tablet Commonly known as:  PEPCID One at bedtime What changed:    how much to take  how to take this  when to take  this  reasons to take this  additional instructions   guaiFENesin-dextromethorphan 100-10 MG/5ML syrup Commonly known as:  ROBITUSSIN DM Take 5 mLs by mouth every 4 (four) hours as needed for cough.   hydrocortisone cream 1 % Apply topically 2 (two) times daily as needed for itching.   ipratropium-albuterol 0.5-2.5 (3) MG/3ML Soln Commonly known as:  DUONEB Take 3 mLs by nebulization every 6 (six) hours as needed.   mometasone-formoterol 100-5 MCG/ACT Aero Commonly  known as:  DULERA Take 2 puffs first thing in am and then another 2 puffs about 12 hours later.   predniSONE 20 MG tablet Commonly known as:  DELTASONE Prednisone 60 mg daily for 3 days followed by  Prednisone 40 mg daily for 3 days followed by  Prednisone 20 mg daily for 3 days.     ASK your doctor about these medications   amoxicillin-clavulanate 875-125 MG tablet Commonly known as:  AUGMENTIN Take 1 tablet by mouth every 12 (twelve) hours for 5 days. Ask about: Should I take this medication?      Follow-up Information    Bakare, Larey Dresser, MD Follow up.   Specialty:  Internal Medicine Why:  appointment November 6. 2019 at 2:30 pm. please keep appt or reschedule (per office appt are limited for next week) Contact information: [place] [address] [phone]        [facility] Follow up in 2 week(s).   Specialty:  Cardiology Why:  diastolic heart failure  Contact information: [address] [place] Follow up.   Why:  home oxygen arranged Contact information: [address] [place] [address] [phone]          No Known Allergies  Consultations:  None.    Procedures/Studies: Dg Chest Port 1 View  Result Date: 08/27/2018 CLINICAL DATA:  Shortness of breath; history of asthma, nonsmoker. EXAM: PORTABLE CHEST 1 VIEW COMPARISON:  Portable chest x-ray of August 24, 2018. FINDINGS: The lungs are adequately inflated. There are increased lung markings in the right infrahilar and left infrahilar regions today which have appeared since yesterday's study. There is no pleural effusion or pneumothorax. The heart is top-normal in size. The pulmonary vascularity is normal. IMPRESSION: Interval development of subsegmental atelectasis or early pneumonia in the infrahilar regions bilaterally. If the patient can  tolerate the procedure, a PA and lateral chest x-ray would be useful. Electronically Signed   By: David  Martinique M.D.   On: 08/27/2018 12:18   Dg Chest Port 1 View  Result Date: 08/24/2018 CLINICAL DATA:  42 year old female with shortness of breath. EXAM: PORTABLE CHEST 1 VIEW COMPARISON:  Chest radiograph dated 08/31/2015 FINDINGS: The heart size and mediastinal contours are within normal limits. Both lungs are clear. The visualized skeletal structures are unremarkable. IMPRESSION: No active disease. Electronically Signed   By: Anner Crete M.D.   On: 08/24/2018 02:19       Subjective: Breathing better, no wheezing heard. Cough has improved.   Discharge Exam: Vitals:   08/28/18 0801 08/28/18 1030  BP:    Pulse:    Resp:    Temp:    SpO2: 97% (!) 75%   Vitals:   08/27/18 2159 08/28/18 0558 08/28/18 0801 08/28/18 1030  BP: 128/75 121/73    Pulse: 78 (!) 55    Resp:      Temp:  97.8 F (36.6 C) 98 F (36.7 C)    TempSrc:      SpO2: 100% 96% 97% (!) 75%  Weight:      Height:        General: Pt is alert, awake, not in acute distress Cardiovascular: RRR, S1/S2 +, no rubs, no gallops Respiratory: CTA bilaterally, no wheezing, no rhonchi Abdominal: Soft, NT, ND, bowel sounds + Extremities: no edema, no cyanosis    The results of significant diagnostics from this hospitalization (including imaging, microbiology, ancillary and laboratory) are listed below for reference.     Microbiology: No results found for this or any previous visit (from the past 240 hour(s)).   Labs: BNP (last 3 results) No results for input(s): BNP in the last 8760 hours. Basic Metabolic Panel: Recent Labs  Lab 08/28/18 0408  NA 139  K 3.9  CL 109  CO2 21*  GLUCOSE 118*  BUN 18  CREATININE 0.88  CALCIUM 7.8*   Liver Function Tests: No results for input(s): AST, ALT, ALKPHOS, BILITOT, PROT, ALBUMIN in the last 168 hours. No results for input(s): LIPASE, AMYLASE in the last 168  hours. No results for input(s): AMMONIA in the last 168 hours. CBC: Recent Labs  Lab 08/28/18 0408  WBC 13.1*  HGB 9.6*  HCT 31.8*  MCV 77.0*  PLT 206   Cardiac Enzymes: No results for input(s): CKTOTAL, CKMB, CKMBINDEX, TROPONINI in the last 168 hours. BNP: Invalid input(s): POCBNP CBG: No results for input(s): GLUCAP in the last 168 hours. D-Dimer No results for input(s): DDIMER in the last 72 hours. Hgb A1c No results for input(s): HGBA1C in the last 72 hours. Lipid Profile No results for input(s): CHOL, HDL, LDLCALC, TRIG, CHOLHDL, LDLDIRECT in the last 72 hours. Thyroid function studies No results for input(s): TSH, T4TOTAL, T3FREE, THYROIDAB in the last 72 hours.  Invalid input(s): FREET3 Anemia work up No results for input(s): VITAMINB12, FOLATE, FERRITIN, TIBC, IRON, RETICCTPCT in the last 72 hours. Urinalysis    Component Value Date/Time   COLORURINE YELLOW 11/20/2014 [place] 11/20/2014 1446   LABSPEC 1.025 11/20/2014 1446   PHURINE 6.0 11/20/2014 1446   GLUCOSEU NEGATIVE 11/20/2014 1446   HGBUR LARGE (A) 11/20/2014 1446   BILIRUBINUR NEGATIVE 11/20/2014 1446   KETONESUR NEGATIVE 11/20/2014 1446   PROTEINUR NEGATIVE 11/20/2014 1446   UROBILINOGEN 1.0 11/20/2014 1446   NITRITE NEGATIVE 11/20/2014 1446   LEUKOCYTESUR NEGATIVE 11/20/2014 1446   Sepsis Labs Invalid input(s): PROCALCITONIN,  WBC,  LACTICIDVEN Microbiology No results found for this or any previous visit (from the past 240 hour(s)).   Time coordinating discharge: 32 minutes  SIGNED:   Hosie Poisson, MD  [facility] 09/03/2018, 7:49 AM Pager   If 7PM-7AM, please contact night-coverage [email or web address] Password [email or web address]

## 2018-09-05 ENCOUNTER — Other Ambulatory Visit: Payer: Self-pay | Admitting: Internal Medicine

## 2018-09-05 DIAGNOSIS — J9601 Acute respiratory failure with hypoxia: Secondary | ICD-10-CM

## 2018-09-08 ENCOUNTER — Ambulatory Visit
Admission: RE | Admit: 2018-09-08 | Discharge: 2018-09-08 | Disposition: A | Discharge status: Home / Self Care | Payer: BLUE CROSS/BLUE SHIELD | Source: Ambulatory Visit | Attending: Internal Medicine | Admitting: Internal Medicine

## 2018-09-08 ENCOUNTER — Encounter: Payer: Self-pay | Admitting: Internal Medicine

## 2018-09-08 DIAGNOSIS — J9601 Acute respiratory failure with hypoxia: Secondary | ICD-10-CM

## 2018-09-08 MED ORDER — IOPAMIDOL (ISOVUE-370) INJECTION 76%
75.0000 mL | Freq: Once | INTRAVENOUS | Status: AC | PRN
  Administered 2018-09-08: 75 mL via INTRAVENOUS

## 2018-09-12 ENCOUNTER — Ambulatory Visit: Payer: BLUE CROSS/BLUE SHIELD | Admitting: Cardiology

## 2018-09-16 ENCOUNTER — Ambulatory Visit (INDEPENDENT_AMBULATORY_CARE_PROVIDER_SITE_OTHER): Payer: BLUE CROSS/BLUE SHIELD | Admitting: Internal Medicine

## 2018-09-16 ENCOUNTER — Ambulatory Visit: Payer: BLUE CROSS/BLUE SHIELD | Admitting: Cardiovascular Disease

## 2018-09-16 ENCOUNTER — Encounter: Payer: Self-pay | Admitting: Internal Medicine

## 2018-09-16 VITALS — BP 114/82 | HR 94 | Ht 67.0 in | Wt 255.0 lb

## 2018-09-16 DIAGNOSIS — R002 Palpitations: Secondary | ICD-10-CM

## 2018-09-16 DIAGNOSIS — R0600 Dyspnea, unspecified: Secondary | ICD-10-CM

## 2018-09-16 DIAGNOSIS — J45909 Unspecified asthma, uncomplicated: Secondary | ICD-10-CM

## 2018-09-16 DIAGNOSIS — R079 Chest pain, unspecified: Secondary | ICD-10-CM | POA: Diagnosis not present

## 2018-09-16 MED ORDER — ASPIRIN 81 MG PO TBEC: 81.0000 mg | DELAYED_RELEASE_TABLET | Freq: Every day | ORAL | Status: DC

## 2018-09-16 MED ORDER — FUROSEMIDE 20 MG PO TABS: 20.0000 mg | ORAL_TABLET | Freq: Every day | ORAL | 5 refills | Status: DC

## 2018-09-16 MED ORDER — METOPROLOL TARTRATE 100 MG PO TABS: ORAL_TABLET | ORAL | 0 refills | Status: DC

## 2018-09-16 NOTE — Progress Notes (Signed)
Cardiology Office Note:    Date:  10/05/2018   ID:  [name], DOB [birth year], MRN [id number]  PCP:  Audley Hose, MD  Cardiologist:  No primary care provider on file.  Electrophysiologist:  None   Referring MD: Audley Hose, MD   Chest pain, shortness of breath, palpitations, anemia  History of Present Illness:    [name] is a 42 y.o. female who presents with chest pain, shortness of breath, palpitations, and dizziness.  She is had palpitations, sometimes daily sometimes 2-3 times per week.  She has had chest discomfort since June where she will get a chest pain and dizziness that radiates to her back.  She also experiences a shortness of breath especially on exertion.  She experiences palpitations separate from the chest pain and the shortness of breath.  With her third child she had preeclampsia.  She is a never smoker.  Her family history is significant for her father who had an MI at age 46, grandparents with hypertension and heart disease, brother with MI at age 63, and aunt with sudden cardiac death at age 65 and a family history of diabetes mellitus and hypertension.  She had previously been placed on metoprolol but felt weird while taking this medication so she is no longer on it.  Past Medical History:  Diagnosis Date  . Abdominal pain   . Anemia   . Asthma   . Diarrhea   . Obesity   . Shortness of breath   . Vomiting     Past Surgical History:  Procedure Laterality Date  . CHOLECYSTECTOMY  06/19/11  . TUBAL LIGATION      Current Medications: Current Meds  Medication Sig  . albuterol (PROVENTIL HFA;VENTOLIN HFA) 108 (90 BASE) MCG/ACT inhaler Inhale 2 puffs into the lungs every 4 (four) hours as needed for wheezing. wheezing (Patient taking differently: Inhale 2 puffs into the lungs every 4 (four) hours as needed for wheezing. )  . aspirin 81 MG EC tablet Take 1 tablet (81 mg total) by mouth daily.  . benzonatate (TESSALON) 200 MG capsule  Take 1 capsule (200 mg total) by mouth 3 (three) times daily as needed for cough.  . famotidine (PEPCID) 20 MG tablet One at bedtime (Patient taking differently: Take 20 mg by mouth daily as needed for heartburn or indigestion. )  . guaiFENesin-dextromethorphan (ROBITUSSIN DM) 100-10 MG/5ML syrup Take 5 mLs by mouth every 4 (four) hours as needed for cough.  . hydrocortisone cream 1 % Apply topically 2 (two) times daily as needed for itching.  Marland Kitchen ipratropium-albuterol (DUONEB) 0.5-2.5 (3) MG/3ML SOLN Take 3 mLs by nebulization every 6 (six) hours as needed.  . mometasone-formoterol (DULERA) 100-5 MCG/ACT AERO Take 2 puffs first thing in am and then another 2 puffs about 12 hours later.  . nitroGLYCERIN (NITROSTAT) 0.4 MG SL tablet DIS 1 T UNT Q 5 MIN PRF CHEST PAIN FOR 3 DOSES  . OXYGEN Inhale into the lungs.  . predniSONE (DELTASONE) 20 MG tablet Prednisone 60 mg daily for 3 days followed by  Prednisone 40 mg daily for 3 days followed by  Prednisone 20 mg daily for 3 days.  . [DISCONTINUED] aspirin 325 MG EC tablet aspirin 325 mg tablet,delayed release  Take 1 tablet every day by oral route.     Allergies:   Patient has no known allergies.   Social History   Socioeconomic History  . Marital status: Single    Spouse name: Not on file  .  Number of children: Not on file  . Years of education: Not on file  . Highest education level: Not on file  Occupational History  . Not on file  Social Needs  . Financial resource strain: Not on file  . Food insecurity:    Worry: Not on file    Inability: Not on file  . Transportation needs:    Medical: Not on file    Non-medical: Not on file  Tobacco Use  . Smoking status: Never Smoker  . Smokeless tobacco: Never Used  Substance and Sexual Activity  . Alcohol use: No  . Drug use: No  . Sexual activity: Not Currently    Birth control/protection: None  Lifestyle  . Physical activity:    Days per week: Not on file    Minutes per session: Not  on file  . Stress: Not on file  Relationships  . Social connections:    Talks on phone: Not on file    Gets together: Not on file    Attends religious service: Not on file    Active member of club or organization: Not on file    Attends meetings of clubs or organizations: Not on file    Relationship status: Not on file  Other Topics Concern  . Not on file  Social History Narrative  . Not on file     Family History: The patient's family history includes Bladder Cancer in her mother; Diabetes (age of onset: 74) in her mother; Emphysema in her maternal aunt; Emphysema (age of onset: 38) in her father; Heart disease in her maternal aunt, maternal grandfather, and sister; Lung cancer in her maternal aunt.  ROS:   Please see the history of present illness.    All other systems reviewed and are negative.  EKGs/Labs/Other Studies Reviewed:    The following studies were reviewed today:  EKG:  EKG is ordered today.  The ekg ordered today demonstrates normal sinus rhythm ventricular rate 94 bpm.  Recent Labs: 08/28/2018: BUN 18; Creatinine, Ser 0.88; Hemoglobin 9.6; Platelets 206; Potassium 3.9; Sodium 139 09/16/2018: TSH 1.260  Recent Lipid Panel    Component Value Date/Time   CHOL 150 01/23/2008 1936   TRIG 54 01/23/2008 1936   HDL 69 01/23/2008 1936   CHOLHDL 2.2 Ratio 01/23/2008 1936   VLDL 11 01/23/2008 1936   LDLCALC 70 01/23/2008 1936    Physical Exam:    VS:  BP 114/82   Pulse 94   Ht 5\' 7"  (1.702 m)   Wt 255 lb (115.7 kg)   BMI 39.94 kg/m     Wt Readings from Last 3 Encounters:  09/16/18 255 lb (115.7 kg)  08/24/18 252 lb 3.3 oz (114.4 kg)  03/13/17 251 lb 9.6 oz (114.1 kg)     Constitutional: No acute distress Eyes: pupils equally round and reactive to light, sclera non-icteric, normal conjunctiva and lids ENMT: normal dentition, moist mucous membranes Cardiovascular: regular rhythm, normal rate, no murmurs. S1 and S2 normal. Radial pulses normal  bilaterally. No jugular venous distention.  Respiratory: clear to auscultation bilaterally GI : normal bowel sounds, soft and nontender. No distention.   MSK: extremities warm, well perfused. No edema.  NEURO: grossly nonfocal exam, moves all extremities. PSYCH: alert and oriented x 3, normal mood and affect.   ASSESSMENT:    1. Chest pain, unspecified type   2. Palpitations   3. Chronic asthma   4. Dyspnea, unspecified type    PLAN:    For her palpitations  we will obtain a TSH and a 24-hour Holter monitor.  To exclude obstructive coronary disease as a source of her chest pain and shortness of breath with exertion, we will obtain a CT coronary angiogram.  With her young age a definitive coronary anatomy study would be very helpful to guide further management.  We will perform FFR if indicated.  I will see her back in 4 to 6 weeks after all testing is complete to reevaluate her symptoms.  Medication Adjustments/Labs and Tests Ordered: Current medicines are reviewed at length with the patient today.  Concerns regarding medicines are outlined above.  Orders Placed This Encounter  Procedures  . CT CORONARY MORPH W/CTA COR W/SCORE W/CA W/CM &/OR WO/CM  . CT CORONARY FRACTIONAL FLOW RESERVE DATA PREP  . CT CORONARY FRACTIONAL FLOW RESERVE FLUID ANALYSIS  . TSH  . Basic metabolic panel  . HOLTER MONITOR - 24 HOUR  . EKG 12-Lead   Meds ordered this encounter  Medications  . furosemide (LASIX) 20 MG tablet    Sig: Take 1 tablet (20 mg total) by mouth daily.    Dispense:  30 tablet    Refill:  5  . aspirin 81 MG EC tablet    Sig: Take 1 tablet (81 mg total) by mouth daily.  . metoprolol tartrate (LOPRESSOR) 100 MG tablet    Sig: Take 1 tablet (100mg ) 2 hours prior to your Coronary CTA    Dispense:  1 tablet    Refill:  0    Patient Instructions  Medication Instructions:  Your physician has recommended you make the following change in your medication:  1) DECREASE Asprin to 81mg   daily 2) START Lasix 20mg  daily  If you need a refill on your cardiac medications before your next appointment, please call your pharmacy.   Lab work: Tsh today  Your physician recommends that you return for lab work 1-2 days prior to your Coronary CTA  If you have labs (blood work) drawn today and your tests are completely normal, you will receive your results only by: Marland Kitchen MyChart Message (if you have MyChart) OR . A paper copy in the mail If you have any lab test that is abnormal or we need to change your treatment, we will call you to review the results.  Testing/Procedures: Your physician has recommended that you wear a holter monitor. Holter monitors are medical devices that record the heart's electrical activity. Doctors most often use these monitors to diagnose arrhythmias. Arrhythmias are problems with the speed or rhythm of the heartbeat. The monitor is a small, portable device. You can wear one while you do your normal daily activities. This is usually used to diagnose what is causing palpitations/syncope (passing out).  Your physician has requested that you have cardiac CT. Cardiac computed tomography (CT) is a painless test that uses an x-ray machine to take clear, detailed pictures of your heart. For further information please visit HugeFiesta.tn. Please follow instruction sheet as given.     Follow-Up: At [facility] you and your health needs are our priority.  As part of our continuing mission to provide you with exceptional heart care, we have created designated Provider Care Teams.  These Care Teams include your primary Cardiologist (physician) and Advanced Practice Providers (APPs -  Physician Assistants and Nurse Practitioners) who all work together to provide you with the care you need, when you need it. You will need a follow up appointment in 4-6 weeks.   You may see Dr.Vivyan Biggers  or one of the following Advanced Practice Providers on your designated Care Team:     Rosaria Ferries, PA-C . Jory Sims, DNP, ANP  Any Other Special Instructions Will Be Listed Below (If Applicable). Please arrive at the [facility] main entrance of [facility] at TBD AM (30-45 minutes prior to test start time)  [facility] [place], [place] [address] [phone]  Proceed to the [facility] Radiology Department (First Floor).  Please follow these instructions carefully (unless otherwise directed):    On the Night Before the Test: . Be sure to Drink plenty of water. . Do not consume any caffeinated/decaffeinated beverages or chocolate 12 hours prior to your test. . Do not take any antihistamines 12 hours prior to your test.  On the Day of the Test: . Drink plenty of water. Do not drink any water within one hour of the test. . Do not eat any food 4 hours prior to the test. . You may take your regular medications prior to the test.  . Take metoprolol (Lopressor) two hours prior to test. . HOLD Furosemide morning of the test.        After the Test: . Drink plenty of water. . After receiving IV contrast, you may experience a mild flushed feeling. This is normal. . On occasion, you may experience a mild rash up to 24 hours after the test. This is not dangerous. If this occurs, you can take Benadryl 25 mg and increase your fluid intake. . If you experience trouble breathing, this can be serious. If it is severe call 911 IMMEDIATELY. If it is mild, please call our office. . If you take any of these medications: Glipizide/Metformin, Avandament, Glucavance, please do not take 48 hours after completing test.       Signed, Elouise Munroe, MD  [place]

## 2018-09-16 NOTE — Patient Instructions (Signed)
Medication Instructions:  Your physician has recommended you make the following change in your medication:  1) DECREASE Asprin to 81mg  daily 2) START Lasix 20mg  daily  If you need a refill on your cardiac medications before your next appointment, please call your pharmacy.   Lab work: Tsh today  Your physician recommends that you return for lab work 1-2 days prior to your Coronary CTA  If you have labs (blood work) drawn today and your tests are completely normal, you will receive your results only by: Marland Kitchen MyChart Message (if you have MyChart) OR . A paper copy in the mail If you have any lab test that is abnormal or we need to change your treatment, we will call you to review the results.  Testing/Procedures: Your physician has recommended that you wear a holter monitor. Holter monitors are medical devices that record the heart's electrical activity. Doctors most often use these monitors to diagnose arrhythmias. Arrhythmias are problems with the speed or rhythm of the heartbeat. The monitor is a small, portable device. You can wear one while you do your normal daily activities. This is usually used to diagnose what is causing palpitations/syncope (passing out).  Your physician has requested that you have cardiac CT. Cardiac computed tomography (CT) is a painless test that uses an x-ray machine to take clear, detailed pictures of your heart. For further information please visit [employer]. Please follow instruction sheet as given.     Follow-Up: At [facility], you and your health needs are our priority.  As part of our continuing mission to provide you with exceptional heart care, we have created designated Provider Care Teams.  These Care Teams include your primary Cardiologist (physician) and Advanced Practice Providers (APPs -  Physician Assistants and Nurse Practitioners) who all work together to provide you with the care you need, when you need it. You will need a follow up  appointment in 4-6 weeks.   You may see Dr.Acharya  or one of the following Advanced Practice Providers on your designated Care Team:   Rosaria Ferries, PA-C . Jory Sims, DNP, ANP  Any Other Special Instructions Will Be Listed Below (If Applicable). Please arrive at the [facility] main entrance of [facility] at TBD AM (30-45 minutes prior to test start time)  [facility] [place], [place] [address] [phone]  Proceed to the [facility] Radiology Department (First Floor).  Please follow these instructions carefully (unless otherwise directed):    On the Night Before the Test: . Be sure to Drink plenty of water. . Do not consume any caffeinated/decaffeinated beverages or chocolate 12 hours prior to your test. . Do not take any antihistamines 12 hours prior to your test.  On the Day of the Test: . Drink plenty of water. Do not drink any water within one hour of the test. . Do not eat any food 4 hours prior to the test. . You may take your regular medications prior to the test.  . Take metoprolol (Lopressor) two hours prior to test. . HOLD Furosemide morning of the test.        After the Test: . Drink plenty of water. . After receiving IV contrast, you may experience a mild flushed feeling. This is normal. . On occasion, you may experience a mild rash up to 24 hours after the test. This is not dangerous. If this occurs, you can take Benadryl 25 mg and increase your fluid intake. . If you experience  trouble breathing, this can be serious. If it is severe call 911 IMMEDIATELY. If it is mild, please call our office. . If you take any of these medications: Glipizide/Metformin, Avandament, Glucavance, please do not take 48 hours after completing test.

## 2018-09-17 LAB — TSH: TSH: 1.26 u[IU]/mL (ref 0.450–4.500)

## 2018-09-23 ENCOUNTER — Ambulatory Visit: Payer: BLUE CROSS/BLUE SHIELD | Admitting: Cardiovascular Disease

## 2018-09-24 ENCOUNTER — Ambulatory Visit (INDEPENDENT_AMBULATORY_CARE_PROVIDER_SITE_OTHER): Payer: BLUE CROSS/BLUE SHIELD

## 2018-09-24 DIAGNOSIS — R002 Palpitations: Secondary | ICD-10-CM | POA: Diagnosis not present

## 2018-10-09 ENCOUNTER — Other Ambulatory Visit (HOSPITAL_COMMUNITY): Payer: Self-pay | Admitting: Internal Medicine

## 2018-10-09 ENCOUNTER — Ambulatory Visit (HOSPITAL_COMMUNITY)
Admission: RE | Admit: 2018-10-09 | Discharge: 2018-10-09 | Disposition: A | Discharge status: Home / Self Care | Payer: BLUE CROSS/BLUE SHIELD | Source: Ambulatory Visit | Attending: Internal Medicine | Admitting: Internal Medicine

## 2018-10-09 ENCOUNTER — Encounter (HOSPITAL_COMMUNITY): Payer: Self-pay

## 2018-10-09 ENCOUNTER — Other Ambulatory Visit: Payer: Self-pay | Admitting: Internal Medicine

## 2018-10-09 ENCOUNTER — Ambulatory Visit (HOSPITAL_COMMUNITY): Admission: RE | Admit: 2018-10-09 | Payer: BLUE CROSS/BLUE SHIELD | Source: Ambulatory Visit

## 2018-10-09 DIAGNOSIS — I878 Other specified disorders of veins: Secondary | ICD-10-CM

## 2018-10-09 DIAGNOSIS — R079 Chest pain, unspecified: Secondary | ICD-10-CM | POA: Insufficient documentation

## 2018-10-09 LAB — BASIC METABOLIC PANEL
BUN / CREAT RATIO: 12 (ref 9–23)
BUN: 8 mg/dL (ref 6–24)
CO2: 23 mmol/L (ref 20–29)
CREATININE: 0.69 mg/dL (ref 0.57–1.00)
Calcium: 9.2 mg/dL (ref 8.7–10.2)
Chloride: 104 mmol/L (ref 96–106)
GFR, EST AFRICAN AMERICAN: 123 mL/min/{1.73_m2} (ref >59)
GFR, EST NON AFRICAN AMERICAN: 107 mL/min/{1.73_m2} (ref >59)
Glucose: 68 mg/dL (ref 65–99)
Potassium: 4.2 mmol/L (ref 3.5–5.2)
Sodium: 140 mmol/L (ref 134–144)

## 2018-10-09 NOTE — Progress Notes (Signed)
Nurse unable to obtain IV and IV able to on second attempt. IV sluggish per nurse and CT.  Patient rescheduled for December 26th and IV start in IR at 67 and CT at 1130. Dr Margaretann Loveless notified and patient verbalized understanding of plan.

## 2018-10-10 ENCOUNTER — Other Ambulatory Visit: Payer: Self-pay

## 2018-10-10 MED ORDER — METOPROLOL TARTRATE 100 MG PO TABS: ORAL_TABLET | ORAL | 0 refills | Status: DC

## 2018-10-10 NOTE — Telephone Encounter (Signed)
Pt Coronary CT will need to be rescheduled. Rx for the one time dose of Metoprolol 100mg  sent to pt pharmacy.

## 2018-10-15 ENCOUNTER — Encounter: Payer: Self-pay | Admitting: Internal Medicine

## 2018-10-15 ENCOUNTER — Ambulatory Visit (INDEPENDENT_AMBULATORY_CARE_PROVIDER_SITE_OTHER): Payer: BLUE CROSS/BLUE SHIELD | Admitting: Internal Medicine

## 2018-10-15 VITALS — BP 102/70 | HR 71 | Ht 68.0 in | Wt 258.4 lb

## 2018-10-15 DIAGNOSIS — J45909 Unspecified asthma, uncomplicated: Secondary | ICD-10-CM

## 2018-10-15 DIAGNOSIS — R079 Chest pain, unspecified: Secondary | ICD-10-CM

## 2018-10-15 DIAGNOSIS — R002 Palpitations: Secondary | ICD-10-CM | POA: Diagnosis not present

## 2018-10-15 NOTE — Patient Instructions (Signed)
Medication Instructions:  Your physician recommends that you continue on your current medications as directed. Please refer to the Current Medication list given to you today.  If you need a refill on your cardiac medications before your next appointment, please call your pharmacy.   Lab work: None ordered If you have labs (blood work) drawn today and your tests are completely normal, you will receive your results only by: Marland Kitchen MyChart Message (if you have MyChart) OR . A paper copy in the mail If you have any lab test that is abnormal or we need to change your treatment, we will call you to review the results.  Testing/Procedures: None ordered  Follow-Up: At [facility], you and your health needs are our priority.  As part of our continuing mission to provide you with exceptional heart care, we have created designated Provider Care Teams.  These Care Teams include your primary Cardiologist (physician) and Advanced Practice Providers (APPs -  Physician Assistants and Nurse Practitioners) who all work together to provide you with the care you need, when you need it. You will need a follow up appointment in 3 months.    You may see Dr.Acharya or one of the following Advanced Practice Providers on your designated Care Team:   Rosaria Ferries, PA-C . Jory Sims, DNP, ANP  Any Other Special Instructions Will Be Listed Below (If Applicable).

## 2018-10-15 NOTE — Progress Notes (Signed)
Cardiology Office Note:    Date:  10/15/2018   ID:  [name], DOB Apr 15, 1976, MRN [id number]  PCP:  Audley Hose, MD  Cardiologist:  No primary care provider on file.  Electrophysiologist:  None   Referring MD: Audley Hose, MD   Chest discomfort, palpitations.  History of Present Illness:    [name] is a 42 y.o. female with a hx of anemia, asthma, and dyspnea.  I saw her in follow-up on September 16, 2018 after hospitalization for an acute asthma exacerbation with superimposed bilateral pneumonia, precipitated by exposure to cigarette smoke.  She continues to have chest discomfort and palpitations.  She is not sleeping and has discussed this with her primary care provider.  For palpitations we checked a Holter monitor as well as a TSH.  Her TSH was within the normal limits, and her Holter monitor showed infrequent ventricular and supraventricular ectopy.  She did not record any symptoms in her diary on Holter, but tells me she did have palpitations during the monitor.  For her chest discomfort we had scheduled a CT coronary angiogram with FFR.  However they were unable to get an IV during her CT scan, and it was rescheduled.  I have encouraged her to hydrate adequately the day of her CT scan so that venous access may be more readily available.  She was able to discontinue the use of her home supplemental oxygen.  The patient denies dyspnea at rest or with exertion, palpitations, PND, orthopnea, or leg swelling. Denies syncope or presyncope. Denies dizziness or lightheadedness.  Past Medical History:  Diagnosis Date  . Abdominal pain   . Anemia   . Asthma   . Diarrhea   . Obesity   . Shortness of breath   . Vomiting     Past Surgical History:  Procedure Laterality Date  . CHOLECYSTECTOMY  06/19/11  . TUBAL LIGATION      Current Medications: Current Meds  Medication Sig  . albuterol (PROVENTIL HFA;VENTOLIN HFA) 108 (90 BASE) MCG/ACT inhaler Inhale 2  puffs into the lungs every 4 (four) hours as needed for wheezing. wheezing (Patient taking differently: Inhale 2 puffs into the lungs every 4 (four) hours as needed for wheezing. )  . aspirin 81 MG EC tablet Take 1 tablet (81 mg total) by mouth daily.  . benzonatate (TESSALON) 200 MG capsule Take 1 capsule (200 mg total) by mouth 3 (three) times daily as needed for cough.  . famotidine (PEPCID) 20 MG tablet One at bedtime (Patient taking differently: Take 20 mg by mouth daily as needed for heartburn or indigestion. )  . furosemide (LASIX) 20 MG tablet Take 1 tablet (20 mg total) by mouth daily.  Marland Kitchen guaiFENesin-dextromethorphan (ROBITUSSIN DM) 100-10 MG/5ML syrup Take 5 mLs by mouth every 4 (four) hours as needed for cough.  . hydrocortisone cream 1 % Apply topically 2 (two) times daily as needed for itching.  Marland Kitchen ipratropium-albuterol (DUONEB) 0.5-2.5 (3) MG/3ML SOLN Take 3 mLs by nebulization every 6 (six) hours as needed.  . metoprolol tartrate (LOPRESSOR) 100 MG tablet Take 1 tablet (100mg ) 2 hours prior to your Coronary CTA  . mometasone-formoterol (DULERA) 100-5 MCG/ACT AERO Take 2 puffs first thing in am and then another 2 puffs about 12 hours later.  . nitroGLYCERIN (NITROSTAT) 0.4 MG SL tablet DIS 1 T UNT Q 5 MIN PRF CHEST PAIN FOR 3 DOSES  . OXYGEN Inhale into the lungs.  . predniSONE (DELTASONE) 20 MG tablet Prednisone  60 mg daily for 3 days followed by  Prednisone 40 mg daily for 3 days followed by  Prednisone 20 mg daily for 3 days.     Allergies:   Patient has no known allergies.   Social History   Socioeconomic History  . Marital status: Single    Spouse name: Not on file  . Number of children: Not on file  . Years of education: Not on file  . Highest education level: Not on file  Occupational History  . Not on file  Social Needs  . Financial resource strain: Not on file  . Food insecurity:    Worry: Not on file    Inability: Not on file  . Transportation needs:     Medical: Not on file    Non-medical: Not on file  Tobacco Use  . Smoking status: Never Smoker  . Smokeless tobacco: Never Used  Substance and Sexual Activity  . Alcohol use: No  . Drug use: No  . Sexual activity: Not Currently    Birth control/protection: None  Lifestyle  . Physical activity:    Days per week: Not on file    Minutes per session: Not on file  . Stress: Not on file  Relationships  . Social connections:    Talks on phone: Not on file    Gets together: Not on file    Attends religious service: Not on file    Active member of club or organization: Not on file    Attends meetings of clubs or organizations: Not on file    Relationship status: Not on file  Other Topics Concern  . Not on file  Social History Narrative  . Not on file     Family History: The patient's family history includes Bladder Cancer in her mother; Diabetes (age of onset: 54) in her mother; Emphysema in her maternal aunt; Emphysema (age of onset: 80) in her father; Heart disease in her maternal aunt, maternal grandfather, and sister; Lung cancer in her maternal aunt.  ROS:   Please see the history of present illness.    All other systems reviewed and are negative.  EKGs/Labs/Other Studies Reviewed:    The following studies were reviewed today:  EKG: Not performed  Recent Labs: 08/28/2018: Hemoglobin 9.6; Platelets 206 09/16/2018: TSH 1.260 10/08/2018: BUN 8; Creatinine, Ser 0.69; Potassium 4.2; Sodium 140  Recent Lipid Panel    Component Value Date/Time   CHOL 150 01/23/2008 1936   TRIG 54 01/23/2008 1936   HDL 69 01/23/2008 1936   CHOLHDL 2.2 Ratio 01/23/2008 1936   VLDL 11 01/23/2008 1936   LDLCALC 70 01/23/2008 1936    Physical Exam:    VS:  BP 102/70   Pulse 71   Ht 5\' 8"  (1.727 m)   Wt 258 lb 6.4 oz (117.2 kg)   BMI 39.29 kg/m     Wt Readings from Last 3 Encounters:  10/15/18 258 lb 6.4 oz (117.2 kg)  09/16/18 255 lb (115.7 kg)  08/24/18 252 lb 3.3 oz (114.4 kg)      Constitutional: No acute distress Eyes: pupils equally round and reactive to light, sclera non-icteric, normal conjunctiva and lids ENMT: normal dentition, moist mucous membranes Cardiovascular: regular rhythm, normal rate, no murmurs. S1 and S2 normal. Radial pulses normal bilaterally. No jugular venous distention.  Respiratory: clear to auscultation bilaterally GI : normal bowel sounds, soft and nontender. No distention.   MSK: extremities warm, well perfused. No edema.  NEURO: grossly nonfocal exam, moves  all extremities. PSYCH: alert and oriented x 3, normal mood and affect.   ASSESSMENT:    1. Chest pain, unspecified type   2. Palpitations   3. Chronic asthma    PLAN:    Her Holter monitor was reassuring that her palpitations are nonmalignant, however her CT scan has not been performed yet.  I think it will be important to rule out obstructive coronary disease or anomalous coronary artery course in the source of her chest discomfort.  I will see her back in 3 months time and we will contact her after her test have been completed.  Medication Adjustments/Labs and Tests Ordered: Current medicines are reviewed at length with the patient today.  Concerns regarding medicines are outlined above.  No orders of the defined types were placed in this encounter.  No orders of the defined types were placed in this encounter.   Patient Instructions  Medication Instructions:  Your physician recommends that you continue on your current medications as directed. Please refer to the Current Medication list given to you today.  If you need a refill on your cardiac medications before your next appointment, please call your pharmacy.   Lab work: None ordered If you have labs (blood work) drawn today and your tests are completely normal, you will receive your results only by: Marland Kitchen MyChart Message (if you have MyChart) OR . A paper copy in the mail If you have any lab test that is abnormal or  we need to change your treatment, we will call you to review the results.  Testing/Procedures: None ordered  Follow-Up: At [facility], you and your health needs are our priority.  As part of our continuing mission to provide you with exceptional heart care, we have created designated Provider Care Teams.  These Care Teams include your primary Cardiologist (physician) and Advanced Practice Providers (APPs -  Physician Assistants and Nurse Practitioners) who all work together to provide you with the care you need, when you need it. You will need a follow up appointment in 3 months.    You may see Dr.Makayla Confer or one of the following Advanced Practice Providers on your designated Care Team:   Rosaria Ferries, PA-C . Jory Sims, DNP, ANP  Any Other Special Instructions Will Be Listed Below (If Applicable).       Signed, Elouise Munroe, MD  10/15/2018 2:32 PM    [facility]

## 2018-10-20 ENCOUNTER — Telehealth (HOSPITAL_COMMUNITY): Payer: Self-pay | Admitting: Emergency Medicine

## 2018-10-20 NOTE — Telephone Encounter (Signed)
Reaching out to patient to offer assistance regarding upcoming cardiac imaging study; unable to reach patient, mailbox full and unable to leave message; will attempt again before scheduled appt date Marchia Bond RN Navigator Cardiac Imaging [phone]

## 2018-10-23 ENCOUNTER — Ambulatory Visit (HOSPITAL_COMMUNITY)
Admission: RE | Admit: 2018-10-23 | Discharge: 2018-10-23 | Disposition: A | Discharge status: Home / Self Care | Payer: BLUE CROSS/BLUE SHIELD | Source: Ambulatory Visit | Attending: Internal Medicine | Admitting: Internal Medicine

## 2018-10-23 ENCOUNTER — Encounter (HOSPITAL_COMMUNITY): Payer: Self-pay | Admitting: Interventional Radiology

## 2018-10-23 ENCOUNTER — Other Ambulatory Visit (HOSPITAL_COMMUNITY): Payer: Self-pay | Admitting: Internal Medicine

## 2018-10-23 ENCOUNTER — Telehealth: Payer: Self-pay | Admitting: Internal Medicine

## 2018-10-23 DIAGNOSIS — R079 Chest pain, unspecified: Secondary | ICD-10-CM

## 2018-10-23 DIAGNOSIS — I878 Other specified disorders of veins: Secondary | ICD-10-CM | POA: Diagnosis not present

## 2018-10-23 HISTORY — PX: IR US GUIDE VASC ACCESS RIGHT: IMG2390

## 2018-10-23 HISTORY — PX: IR RADIOLOGY PERIPHERAL GUIDED IV START: IMG5598

## 2018-10-23 MED ORDER — NITROGLYCERIN 0.4 MG SL SUBL
SUBLINGUAL_TABLET | SUBLINGUAL | Status: AC
  Filled 2018-10-23: qty 2

## 2018-10-23 MED ORDER — LIDOCAINE HCL 1 % IJ SOLN
INTRAMUSCULAR | Status: AC
  Filled 2018-10-23: qty 20

## 2018-10-23 MED ORDER — NITROGLYCERIN 0.4 MG SL SUBL
0.8000 mg | SUBLINGUAL_TABLET | Freq: Once | SUBLINGUAL | Status: AC
  Administered 2018-10-23: 0.8 mg via SUBLINGUAL
  Filled 2018-10-23: qty 25

## 2018-10-23 MED ORDER — LIDOCAINE HCL 1 % IJ SOLN
INTRAMUSCULAR | Status: DC | PRN
  Administered 2018-10-23: 2 mL

## 2018-10-23 MED ORDER — IOPAMIDOL (ISOVUE-370) INJECTION 76%
100.0000 mL | Freq: Once | INTRAVENOUS | Status: AC | PRN
  Administered 2018-10-23: 100 mL via INTRAVENOUS

## 2018-10-23 NOTE — Telephone Encounter (Signed)
Spoke with Valarie Merino from [facility] who states he was calling to give CT coronary report impression.  IMPRESSION: Ill-defined nodular thickening in the peripheral aspect of the LEFT lower lobe. Favor small focus of infection or inflammation. Recommend follow-up CT in 6-8 weeks.  Will route to MD.

## 2018-10-23 NOTE — Telephone Encounter (Signed)
MD made aware of Impression.

## 2018-10-27 ENCOUNTER — Telehealth: Payer: Self-pay

## 2018-10-27 DIAGNOSIS — J189 Pneumonia, unspecified organism: Secondary | ICD-10-CM

## 2018-10-27 NOTE — Telephone Encounter (Signed)
-----   Message from Elouise Munroe, MD sent at 10/27/2018 12:11 PM EST ----- No CAD and no coronary calcium.  There is a small area of possible inflammation or infection in the lungs. While this is not acutely worrisome, the radiologist recommends repeat imaging in 6-8 weeks.   Please arrange for a CT Chest without contrast to follow up the "Ill-defined nodular thickening in the peripheral aspect of the LEFT lower lobe. Favor small focus of infection or inflammation. Recommend follow-up CT in 6-8 weeks."

## 2018-10-27 NOTE — Telephone Encounter (Signed)
We have arranged a follow up scan at the recommendation of radiology.

## 2018-10-27 NOTE — Telephone Encounter (Signed)
Called to give pt Cor CT results and Dr.Acharya's recommendation. Unable to lmom, pt voicemail is full. Will attempt again.

## 2018-10-28 NOTE — Telephone Encounter (Signed)
Spoke with pt. Pt aware of Cor CTA results with verbal understanding. Pt aware of the Radiologist recommendation for the pt to have a Chest CT wo/ contrast in 6-8 weeks. Pt is agreeable with plan. Adv pt that I will place the order and forward the message to scheduling to call her to schedule. Pt verbalized understanding. Pt will f/u with Dr.Acharya as planned in March 2020.

## 2018-10-28 NOTE — Telephone Encounter (Signed)
New Message   Pt returning call for nurse about ct results

## 2018-10-28 NOTE — Telephone Encounter (Signed)
-----   Message from Elouise Munroe, MD sent at 10/27/2018 12:11 PM EST ----- No CAD and no coronary calcium.  There is a small area of possible inflammation or infection in the lungs. While this is not acutely worrisome, the radiologist recommends repeat imaging in 6-8 weeks.   Please arrange for a CT Chest without contrast to follow up the "Ill-defined nodular thickening in the peripheral aspect of the LEFT lower lobe. Favor small focus of infection or inflammation. Recommend follow-up CT in 6-8 weeks."

## 2018-10-30 NOTE — Telephone Encounter (Signed)
Spoke with patient regarding Chest CT without contrast-----scheduled for Monday 12/29/18 at 9:30am--pt aware this is  Schedule for Monday 12/29/18 at 9:30am at our [facility] location [phone])

## 2018-12-29 ENCOUNTER — Other Ambulatory Visit: Payer: BLUE CROSS/BLUE SHIELD

## 2019-01-19 ENCOUNTER — Ambulatory Visit: Payer: BLUE CROSS/BLUE SHIELD | Admitting: Internal Medicine

## 2019-01-21 ENCOUNTER — Other Ambulatory Visit: Payer: Self-pay

## 2019-01-21 ENCOUNTER — Ambulatory Visit (INDEPENDENT_AMBULATORY_CARE_PROVIDER_SITE_OTHER): Payer: BLUE CROSS/BLUE SHIELD

## 2019-01-21 ENCOUNTER — Ambulatory Visit (HOSPITAL_COMMUNITY)
Admission: EM | Admit: 2019-01-21 | Discharge: 2019-01-21 | Disposition: A | Discharge status: Home / Self Care | Payer: BLUE CROSS/BLUE SHIELD | Attending: Emergency Medicine | Admitting: Emergency Medicine

## 2019-01-21 DIAGNOSIS — R0602 Shortness of breath: Secondary | ICD-10-CM | POA: Diagnosis not present

## 2019-01-21 DIAGNOSIS — R0789 Other chest pain: Secondary | ICD-10-CM | POA: Diagnosis not present

## 2019-01-21 DIAGNOSIS — J4541 Moderate persistent asthma with (acute) exacerbation: Secondary | ICD-10-CM

## 2019-01-21 MED ORDER — ALBUTEROL SULFATE HFA 108 (90 BASE) MCG/ACT IN AERS
2.0000 | INHALATION_SPRAY | Freq: Once | RESPIRATORY_TRACT | Status: AC
  Administered 2019-01-21: 2 via RESPIRATORY_TRACT

## 2019-01-21 MED ORDER — IPRATROPIUM-ALBUTEROL 0.5-2.5 (3) MG/3ML IN SOLN: 3.0000 mL | Freq: Four times a day (QID) | RESPIRATORY_TRACT | 0 refills | Status: DC | PRN

## 2019-01-21 MED ORDER — METHYLPREDNISOLONE SODIUM SUCC 125 MG IJ SOLR
INTRAMUSCULAR | Status: AC
  Filled 2019-01-21: qty 2

## 2019-01-21 MED ORDER — ALBUTEROL SULFATE HFA 108 (90 BASE) MCG/ACT IN AERS
INHALATION_SPRAY | RESPIRATORY_TRACT | Status: AC
  Filled 2019-01-21: qty 6.7

## 2019-01-21 MED ORDER — METHYLPREDNISOLONE SODIUM SUCC 125 MG IJ SOLR
125.0000 mg | Freq: Once | INTRAMUSCULAR | Status: AC
  Administered 2019-01-21: 125 mg via INTRAMUSCULAR

## 2019-01-21 MED ORDER — PREDNISONE 50 MG PO TABS: 50.0000 mg | ORAL_TABLET | Freq: Every day | ORAL | 0 refills | Status: AC

## 2019-01-21 NOTE — ED Triage Notes (Signed)
ASthma flare-up, audible wheezing, no fever.

## 2019-01-21 NOTE — ED Provider Notes (Addendum)
[place]    CSN: [id number] Arrival date & time: 01/21/19  6314     History   Chief Complaint Chief Complaint  Patient presents with  . Shortness of Breath    HPI [name] is a 43 y.o. female history of asthma presenting today for evaluation of asthma exacerbation.  Patient states that beginning last night she started to develop increased shortness of breath and cough.  She has had some slight discomfort in her chest over the past couple days that worsens with coughing.  Denies history of hypertension, diabetes or smoking.  Denies previous DVT/PE.  Denies leg pain or leg swelling.  Symptoms do feel similar to asthma exacerbation, but does note that she had pneumonia in December.  She is on [facility], but is out of her albuterol inhaler and nebulizers.  Last course of steroids was in January.  Denies associated URI symptoms of congestion, sore throat. Denies any recent travel, denies any known exposure to COVID-19. Denies exposure to contacts with similar symptoms. Denies fevers, chills.   HPI  Past Medical History:  Diagnosis Date  . Abdominal pain   . Anemia   . Asthma   . Diarrhea   . Obesity   . Shortness of breath   . Vomiting     Patient Active Problem List   Diagnosis Date Noted  . Lobar pneumonia ([place]) 08/27/2018  . Asthma exacerbation 08/24/2018  . Menorrhagia with regular cycle 01/07/2015  . Chronic asthma 07/31/2013    Past Surgical History:  Procedure Laterality Date  . CHOLECYSTECTOMY  06/19/11  . IR RADIOLOGY PERIPHERAL GUIDED IV START  10/23/2018  . IR US GUIDE VASC ACCESS RIGHT  10/23/2018  . TUBAL LIGATION      OB History    Gravida  5   Para  4   Term  3   Preterm  1   AB  1   Living  4     SAB  1   TAB      Ectopic      Multiple      Live Births  4            Home Medications    Prior to Admission medications   Medication Sig Start Date End Date Taking? Authorizing Provider  albuterol (PROVENTIL  HFA;VENTOLIN HFA) 108 (90 BASE) MCG/ACT inhaler Inhale 2 puffs into the lungs every 4 (four) hours as needed for wheezing. wheezing Patient taking differently: Inhale 2 puffs into the lungs every 4 (four) hours as needed for wheezing.  06/23/13   Reyne Dumas, MD  aspirin 81 MG EC tablet Take 1 tablet (81 mg total) by mouth daily. 09/16/18   Elouise Munroe, MD  benzonatate (TESSALON) 200 MG capsule Take 1 capsule (200 mg total) by mouth 3 (three) times daily as needed for cough. 08/26/18   Hosie Poisson, MD  famotidine (PEPCID) 20 MG tablet One at bedtime Patient taking differently: Take 20 mg by mouth daily as needed for heartburn or indigestion.  09/03/13   Tanda Rockers, MD  furosemide (LASIX) 20 MG tablet Take 1 tablet (20 mg total) by mouth daily. 09/16/18 12/15/18  Elouise Munroe, MD  guaiFENesin-dextromethorphan (ROBITUSSIN DM) 100-10 MG/5ML syrup Take 5 mLs by mouth every 4 (four) hours as needed for cough. 08/26/18   Hosie Poisson, MD  hydrocortisone cream 1 % Apply topically 2 (two) times daily as needed for itching. 08/28/18   Hosie Poisson, MD  ipratropium-albuterol (DUONEB) 0.5-2.5 (  3) MG/3ML SOLN Take 3 mLs by nebulization every 6 (six) hours as needed. 01/21/19   Kahliya Fraleigh C, PA-C  metoprolol tartrate (LOPRESSOR) 100 MG tablet Take 1 tablet (100mg ) 2 hours prior to your Coronary CTA 10/10/18   Elouise Munroe, MD  mometasone-formoterol [facility]) 100-5 MCG/ACT AERO Take 2 puffs first thing in am and then another 2 puffs about 12 hours later. 07/30/13   Tanda Rockers, MD  nitroGLYCERIN (NITROSTAT) 0.4 MG SL tablet DIS 1 T UNT Q 5 MIN PRF CHEST PAIN FOR 3 DOSES 09/09/18   [provider]  OXYGEN Inhale into the lungs.    [provider]  predniSONE (DELTASONE) 50 MG tablet Take 1 tablet (50 mg total) by mouth daily for 5 days. 01/21/19 01/26/19  Geni Skorupski, Elesa Hacker, PA-C    Family History Family History  Problem Relation Age of Onset  . Diabetes Mother 54   . Bladder Cancer Mother   . Emphysema Father 14       smoked  . Emphysema Maternal Aunt        smoked  . Lung cancer Maternal Aunt        smoked  . Heart disease Maternal Grandfather   . Heart disease Maternal Aunt   . Heart disease Sister     Social History Social History   Tobacco Use  . Smoking status: Never Smoker  . Smokeless tobacco: Never Used  Substance Use Topics  . Alcohol use: No  . Drug use: No     Allergies   Patient has no known allergies.   Review of Systems Review of Systems  Constitutional: Negative for activity change, appetite change, chills, fatigue and fever.  HENT: Negative for congestion, ear pain, rhinorrhea, sinus pressure, sore throat and trouble swallowing.   Eyes: Negative for discharge and redness.  Respiratory: Positive for cough, shortness of breath and wheezing. Negative for chest tightness.   Cardiovascular: Negative for chest pain.  Gastrointestinal: Negative for abdominal pain, diarrhea, nausea and vomiting.  Musculoskeletal: Negative for myalgias.  Skin: Negative for rash.  Neurological: Negative for dizziness, light-headedness and headaches.     Physical Exam Triage Vital Signs ED Triage Vitals  Enc Vitals Group     BP 01/21/19 0819 (!) 142/71     Pulse Rate 01/21/19 0818 91     Resp 01/21/19 0818 (!) 24     Temp 01/21/19 0818 98.2 F (36.8 C)     Temp Source 01/21/19 0818 Oral     SpO2 01/21/19 0818 97 %     Weight --      Height --      Head Circumference --      Peak Flow --      Pain Score 01/21/19 0818 4     Pain Loc --      Pain Edu? --      Excl. in [place]? --    No data found.  Updated Vital Signs BP (!) 142/71   Pulse 91   Temp 98.2 F (36.8 C) (Oral)   Resp (!) 24   SpO2 97%   Visual Acuity Right Eye Distance:   Left Eye Distance:   Bilateral Distance:    Right Eye Near:   Left Eye Near:    Bilateral Near:     Physical Exam Vitals signs and nursing note reviewed.  Constitutional:       General: She is not in acute distress.    Appearance: She is well-developed.  HENT:  Head: Normocephalic and atraumatic.     Ears:     Comments: Bilateral ears without tenderness to palpation of external auricle, tragus and mastoid, EAC's without erythema or swelling, TM's with good bony landmarks and cone of light. Non erythematous.     Mouth/Throat:     Comments: Oral mucosa pink and moist, no tonsillar enlargement or exudate. Posterior pharynx patent and nonerythematous, no uvula deviation or swelling. Normal phonation. Eyes:     Conjunctiva/sclera: Conjunctivae normal.  Neck:     Musculoskeletal: Neck supple.  Cardiovascular:     Rate and Rhythm: Normal rate and regular rhythm.     Heart sounds: No murmur.  Pulmonary:     Breath sounds: Wheezing present.     Comments: Patient appears slightly labored to breathe, audible wheezing, on auscultation faint wheezing present in anterior lung fields, not as prominent in posterior lung fields. Left anterior chest tender to palpation Chest:     Chest wall: Tenderness present.  Abdominal:     Palpations: Abdomen is soft.     Tenderness: There is no abdominal tenderness.  Musculoskeletal:     Comments: Bilateral lower extremities without swelling or calf tenderness  Skin:    General: Skin is warm and dry.  Neurological:     Mental Status: She is alert.      UC Treatments / Results  Labs (all labs ordered are listed, but only abnormal results are displayed) Labs Reviewed - No data to display  EKG None  Radiology No results found.  Procedures Procedures (including critical care time)  Medications Ordered in UC Medications  methylPREDNISolone sodium succinate (SOLU-MEDROL) 125 mg/2 mL injection 125 mg (has no administration in time range)  albuterol (PROVENTIL HFA;VENTOLIN HFA) 108 (90 Base) MCG/ACT inhaler 2 puff (has no administration in time range)    Initial Impression / Assessment and Plan / UC Course  I have  reviewed the triage vital signs and the nursing notes.  Pertinent labs & imaging results that were available during my care of the patient were reviewed by me and considered in my medical decision making (see chart for details).     Patient likely with asthma exacerbation, low risk for symptoms being related to COVID-19.  Will obtain chest x-ray to ensure resolution of pneumonia and no other persistent abnormalities on chest x-ray- Negative, but low lung volumes as patient had difficulty taking deep breath without coughing.. Chest discomfort reproducible on exam, negative risk factors for heart disease, less likely cardiac origin.  Oxygen stable in clinic, no fever, deferring in clinic nebulizers given current COVID-19 environment, will provide Solu-Medrol IM, albuterol inhaler in clinic, prescribed duo nebs to use with her breathing machine at home.  Discussed strict precautions of following up in ED if symptoms not responding to treatment provided, at home nebulizers/steroids or symptoms worsening in the next 24 hours.Discussed strict return precautions. Patient verbalized understanding and is agreeable with plan.  Final Clinical Impressions(s) / UC Diagnoses   Final diagnoses:  Moderate persistent asthma with exacerbation     Discharge Instructions     We gave you an injection of solumedrol today, and albuterol inhaler Please continue to use inhaler as needed Please pick up nebulizers from pharmacy and use these as needed for further shortness of breath and wheezing May continue prednisone daily for the next 5 days with food  If you do not have any improvement or worsening of symptoms, fever, worsening shortness of breath with the prescribed nebulizers/steroids in the next 24 hours  please go to emergency room for further evaluation and treatment.   ED Prescriptions    Medication Sig Dispense Auth. Provider   predniSONE (DELTASONE) 50 MG tablet Take 1 tablet (50 mg total) by mouth daily  for 5 days. 5 tablet Makenly Larabee C, PA-C   ipratropium-albuterol (DUONEB) 0.5-2.5 (3) MG/3ML SOLN Take 3 mLs by nebulization every 6 (six) hours as needed. 360 mL Olufemi Mofield C, PA-C     Controlled Substance Prescriptions [place] Controlled Substance Registry consulted? Not Applicable   Shelton Silvas 01/21/19 3810    Janith Lima, PA-C 01/21/19 1212

## 2019-01-21 NOTE — Discharge Instructions (Signed)
We gave you an injection of solumedrol today, and albuterol inhaler Please continue to use inhaler as needed Please pick up nebulizers from pharmacy and use these as needed for further shortness of breath and wheezing May continue prednisone daily for the next 5 days with food  If you do not have any improvement or worsening of symptoms, fever, worsening shortness of breath with the prescribed nebulizers/steroids in the next 24 hours please go to emergency room for further evaluation and treatment.

## 2019-03-12 ENCOUNTER — Telehealth: Payer: BLUE CROSS/BLUE SHIELD | Admitting: Internal Medicine

## 2019-03-12 ENCOUNTER — Telehealth: Payer: Self-pay | Admitting: Internal Medicine

## 2019-03-12 NOTE — Telephone Encounter (Signed)
VERBAL CONSENT GIVEN TO [name] ON 03/12/2019 AT 4:42PM.      Virtual Visit Pre-Appointment Phone Call  "Ms. [name], I am calling you today to discuss your upcoming appointment. We are currently trying to limit exposure to the virus that causes COVID-19 by seeing patients at home rather than in the office."  1. "What is the BEST phone number to call the day of the visit?" - include this in appointment notes  2. "Do you have or have access to (through a family member/friend) a smartphone with video capability that we can use for your visit?" a. If yes - list this number in appt notes as "cell" (if different from BEST phone #) and list the appointment type as a VIDEO visit in appointment notes b. If no - list the appointment type as a PHONE visit in appointment notes  3. Confirm consent - "In the setting of the current Covid19 crisis, you are scheduled for a VIDEO visit with your provider on 03/13/2019 at 3:20pm.  Just as we do with many in-office visits, in order for you to participate in this visit, we must obtain consent.  If you'd like, I can send this to your mychart (if signed up) or email for you to review.  Otherwise, I can obtain your verbal consent now.  All virtual visits are billed to your insurance company just like a normal visit would be.  By agreeing to a virtual visit, we'd like you to understand that the technology does not allow for your provider to perform an examination, and thus may limit your provider's ability to fully assess your condition. If your provider identifies any concerns that need to be evaluated in person, we will make arrangements to do so.  Finally, though the technology is pretty good, we cannot assure that it will always work on either your or our end, and in the setting of a video visit, we may have to convert it to a phone-only visit.  In either situation, we cannot ensure that we have a secure connection.  Are you willing to proceed?" STAFF: Did the  patient verbally acknowledge consent to telehealth visit? Document YES/NO here: YES  4. Advise patient to be prepared - "Two hours prior to your appointment, go ahead and check your blood pressure, pulse, oxygen saturation, and your weight (if you have the equipment to check those) and write them all down. When your visit starts, your provider will ask you for this information. If you have an Apple Watch or Kardia device, please plan to have heart rate information ready on the day of your appointment. Please have a pen and paper handy nearby the day of the visit as well."  5. Give patient instructions for MyChart download to smartphone OR Doximity/[email or web address].me as below if video visit (depending on what platform provider is using)  6. Inform patient they will receive a phone call 15 minutes prior to their appointment time (may be from unknown caller ID) so they should be prepared to answer    TELEPHONE CALL NOTE  [name] has been deemed a candidate for a follow-up tele-health visit to limit community exposure during the Covid-19 pandemic. I spoke with the patient via phone to ensure availability of phone/video source, confirm preferred email & phone number, and discuss instructions and expectations.  I reminded [name] to be prepared with any vital sign and/or heart rhythm information that could potentially be obtained via home monitoring, at the time of her visit.  I reminded [name] to expect a phone call prior to her visit.  [name], CMA 03/12/2019 5:50 PM   INSTRUCTIONS FOR DOWNLOADING THE MYCHART APP TO SMARTPHONE  - The patient must first make sure to have activated MyChart and know their login information - If Apple, go to [employer] and type in MyChart in the search bar and download the app. If Android, ask patient to go to [employer] and type in [place] in the search bar and download the app. The app is free but as with any other app downloads, their  phone may require them to verify saved payment information or Apple/Android password.  - The patient will need to then log into the app with their MyChart username and password, and select [facility] as their healthcare provider to link the account. When it is time for your visit, go to the MyChart app, find appointments, and click Begin Video Visit. Be sure to Select Allow for your device to access the Microphone and Camera for your visit. You will then be connected, and your provider will be with you shortly.  **If they have any issues connecting, or need assistance please contact MyChart service desk [phone])**  **If using a computer, in order to ensure the best quality for their visit they will need to use either of the following Internet Browsers: [employer], or Google Chrome**  IF USING DOXIMITY or DOXY.ME - The patient will receive a link just prior to their visit by text.     FULL LENGTH CONSENT FOR TELE-HEALTH VISIT   I hereby voluntarily request, consent and authorize [facility] and its employed or contracted physicians, physician assistants, nurse practitioners or other licensed health care professionals (the Practitioner), to provide me with telemedicine health care services (the "Services") as deemed necessary by the treating Practitioner. I acknowledge and consent to receive the Services by the Practitioner via telemedicine. I understand that the telemedicine visit will involve communicating with the Practitioner through live audiovisual communication technology and the disclosure of certain medical information by electronic transmission. I acknowledge that I have been given the opportunity to request an in-person assessment or other available alternative prior to the telemedicine visit and am voluntarily participating in the telemedicine visit.  I understand that I have the right to withhold or withdraw my consent to the use of telemedicine in the course of  my care at any time, without affecting my right to future care or treatment, and that the Practitioner or I may terminate the telemedicine visit at any time. I understand that I have the right to inspect all information obtained and/or recorded in the course of the telemedicine visit and may receive copies of available information for a reasonable fee.  I understand that some of the potential risks of receiving the Services via telemedicine include:  Marland Kitchen Delay or interruption in medical evaluation due to technological equipment failure or disruption; . Information transmitted may not be sufficient (e.g. poor resolution of images) to allow for appropriate medical decision making by the Practitioner; and/or  . In rare instances, security protocols could fail, causing a breach of personal health information.  Furthermore, I acknowledge that it is my responsibility to provide information about my medical history, conditions and care that is complete and accurate to the best of my ability. I acknowledge that Practitioner's advice, recommendations, and/or decision may be based on factors not within their control, such as incomplete or inaccurate data provided by me or  distortions of diagnostic images or specimens that may result from electronic transmissions. I understand that the practice of medicine is not an exact science and that Practitioner makes no warranties or guarantees regarding treatment outcomes. I acknowledge that I will receive a copy of this consent concurrently upon execution via email to the email address I last provided but may also request a printed copy by calling the office of [facility].    I understand that my insurance will be billed for this visit.   I have read or had this consent read to me. . I understand the contents of this consent, which adequately explains the benefits and risks of the Services being provided via telemedicine.  . I have been provided ample opportunity to ask  questions regarding this consent and the Services and have had my questions answered to my satisfaction. . I give my informed consent for the services to be provided through the use of telemedicine in my medical care  By participating in this telemedicine visit I agree to the above.

## 2019-03-12 NOTE — Telephone Encounter (Signed)
Mychart, smartphone, pre reg complete 03/12/19 AF °

## 2019-03-13 ENCOUNTER — Telehealth (INDEPENDENT_AMBULATORY_CARE_PROVIDER_SITE_OTHER): Payer: BLUE CROSS/BLUE SHIELD | Admitting: Internal Medicine

## 2019-03-13 ENCOUNTER — Telehealth: Payer: Self-pay

## 2019-03-13 ENCOUNTER — Encounter: Payer: Self-pay | Admitting: Internal Medicine

## 2019-03-13 VITALS — BP 160/79 | HR 93 | Ht 69.5 in | Wt 240.0 lb

## 2019-03-13 DIAGNOSIS — R079 Chest pain, unspecified: Secondary | ICD-10-CM

## 2019-03-13 DIAGNOSIS — R002 Palpitations: Secondary | ICD-10-CM

## 2019-03-13 DIAGNOSIS — R0600 Dyspnea, unspecified: Secondary | ICD-10-CM | POA: Diagnosis not present

## 2019-03-13 DIAGNOSIS — J45909 Unspecified asthma, uncomplicated: Secondary | ICD-10-CM

## 2019-03-13 NOTE — Progress Notes (Signed)
Virtual Visit via Video Note   This visit type was conducted due to national recommendations for restrictions regarding the COVID-19 Pandemic (e.g. social distancing) in an effort to limit this patient's exposure and mitigate transmission in our community.  Due to her co-morbid illnesses, this patient is at least at moderate risk for complications without adequate follow up.  This format is felt to be most appropriate for this patient at this time.  All issues noted in this document were discussed and addressed.  A limited physical exam was performed with this format.  Please refer to the patient's chart for her consent to telehealth for [facility].   Date:  03/13/2019   ID:  [name], DOB 12-Jun-1976, MRN [id number]  Patient Location: Home Provider Location: Home  PCP:  No primary care provider on file.  Cardiologist:  Elouise Munroe, MD  Electrophysiologist:  None   Evaluation Performed:  Follow-Up Visit  Chief Complaint:  F/u chest discomfort and shortness of breath  History of Present Illness:    [name] is a 43 y.o. female with hx of anemia, asthma, and dyspnea.  I saw her in follow-up on September 16, 2018 after hospitalization for an acute asthma exacerbation with superimposed bilateral pneumonia, precipitated by exposure to cigarette smoke. Last follow up 10/15/2018.   Her dyspnea has improved quite a bit and she feels better from a cardiac standpoint. She is experiencing some exacerbation of mood symptoms, and we discussed reviewing this with PCP, as this is understandable during the COVID-19 global pandemic. She did seek care in March in the ED for an asthma exacerbation. She has felt better since that time. She is not requiring oxygen today.   The patient denies chest pain, chest pressure, dyspnea at rest or with exertion, palpitations, PND, orthopnea, or leg swelling. Denies syncope or presyncope. Denies dizziness or lightheadedness.   The patient does not  have symptoms concerning for COVID-19 infection (fever, chills, cough, or new shortness of breath).    Past Medical History:  Diagnosis Date  . Abdominal pain   . Anemia   . Asthma   . Diarrhea   . Obesity   . Shortness of breath   . Vomiting    Past Surgical History:  Procedure Laterality Date  . CHOLECYSTECTOMY  06/19/11  . IR RADIOLOGY PERIPHERAL GUIDED IV START  10/23/2018  . IR US GUIDE VASC ACCESS RIGHT  10/23/2018  . TUBAL LIGATION       Current Meds  Medication Sig  . albuterol (PROVENTIL HFA;VENTOLIN HFA) 108 (90 BASE) MCG/ACT inhaler Inhale 2 puffs into the lungs every 4 (four) hours as needed for wheezing. wheezing (Patient taking differently: Inhale 2 puffs into the lungs every 4 (four) hours as needed for wheezing. )  . aspirin 81 MG EC tablet Take 1 tablet (81 mg total) by mouth daily.  . benzonatate (TESSALON) 200 MG capsule Take 1 capsule (200 mg total) by mouth 3 (three) times daily as needed for cough.  Marland Kitchen guaiFENesin-dextromethorphan (ROBITUSSIN DM) 100-10 MG/5ML syrup Take 5 mLs by mouth every 4 (four) hours as needed for cough.  . hydrocortisone cream 1 % Apply topically 2 (two) times daily as needed for itching.  Marland Kitchen ipratropium-albuterol (DUONEB) 0.5-2.5 (3) MG/3ML SOLN Take 3 mLs by nebulization every 6 (six) hours as needed.  . mometasone-formoterol (DULERA) 100-5 MCG/ACT AERO Take 2 puffs first thing in am and then another 2 puffs about 12 hours later.  . nitroGLYCERIN (NITROSTAT) 0.4 MG  SL tablet DIS 1 T UNT Q 5 MIN PRF CHEST PAIN FOR 3 DOSES  . pantoprazole (PROTONIX) 40 MG tablet Take 1 tablet by mouth daily.  . sertraline (ZOLOFT) 25 MG tablet Take 25 mg by mouth daily.     Allergies:   Patient has no known allergies.   Social History   Tobacco Use  . Smoking status: Never Smoker  . Smokeless tobacco: Never Used  Substance Use Topics  . Alcohol use: No  . Drug use: No     Family Hx: The patient's family history includes Bladder Cancer in her  mother; Diabetes (age of onset: 36) in her mother; Emphysema in her maternal aunt; Emphysema (age of onset: 15) in her father; Heart disease in her maternal aunt, maternal grandfather, and sister; Lung cancer in her maternal aunt.  ROS:   Please see the history of present illness.     All other systems reviewed and are negative.   Prior CV studies:   The following studies were reviewed today:  CT coronary angiogram 10/23/2018 Labs/Other Tests and Data Reviewed:    EKG:  No ECG reviewed.  Recent Labs: 08/28/2018: Hemoglobin 9.6; Platelets 206 09/16/2018: TSH 1.260 10/08/2018: BUN 8; Creatinine, Ser 0.69; Potassium 4.2; Sodium 140   Recent Lipid Panel Lab Results  Component Value Date/Time   CHOL 150 01/23/2008 07:36 PM   TRIG 54 01/23/2008 07:36 PM   HDL 69 01/23/2008 07:36 PM   CHOLHDL 2.2 Ratio 01/23/2008 07:36 PM   LDLCALC 70 01/23/2008 07:36 PM    Wt Readings from Last 3 Encounters:  03/13/19 240 lb (108.9 kg)  10/15/18 258 lb 6.4 oz (117.2 kg)  09/16/18 255 lb (115.7 kg)     Objective:    Vital Signs:  BP (!) 167/83   Pulse 93   Ht 5' 9.5" (1.765 m)   Wt 240 lb (108.9 kg)   BMI 34.93 kg/m    VITAL SIGNS:  reviewed GEN:  no acute distress EYES:  sclerae anicteric, EOMI - Extraocular Movements Intact RESPIRATORY:  normal respiratory effort, symmetric expansion CARDIOVASCULAR:  no peripheral edema SKIN:  no rash, lesions or ulcers. MUSCULOSKELETAL:  no obvious deformities. NEURO:  alert and oriented x 3, no obvious focal deficit PSYCH:  normal affect  ASSESSMENT & PLAN:    1. Chest pain, unspecified type   2. Palpitations   3. Dyspnea, unspecified type   4. Chronic asthma    She has not had concerning chest pain or significant dyspnea on exertion in the past several weeks, no significant palpitations. No change to therapy at this time.   She is experiencing mood symptoms and would like to follow up with her PCP. During COVID-19, I have encouraged her  to reach out to her PCP and request at least a virtual visit to discuss and address medication therapy. She tells me she has run out of her sertraline.   Asthma - per PCP, currently stable.    COVID-19 Education: The signs and symptoms of COVID-19 were discussed with the patient and how to seek care for testing (follow up with PCP or arrange E-visit).  The importance of social distancing was discussed today.  Time:   Today, I have spent 17 minutes with the patient with telehealth technology discussing the above problems.     Medication Adjustments/Labs and Tests Ordered: Current medicines are reviewed at length with the patient today.  Concerns regarding medicines are outlined above.   Tests Ordered: No orders of the defined types  were placed in this encounter.   Medication Changes: No orders of the defined types were placed in this encounter.   Disposition:  Follow up in 6 month(s)  Signed, Elouise Munroe, MD  03/13/2019 3:44 PM    [facility]  Medication Instructions:  Your physician recommends that you continue on your current medications as directed. Please refer to the Current Medication list given to you today. If you need a refill on your cardiac medications before your next appointment, please call your pharmacy.   Lab work: None  If you have labs (blood work) drawn today and your tests are completely normal, you will receive your results only by: Marland Kitchen MyChart Message (if you have MyChart) OR . A paper copy in the mail If you have any lab test that is abnormal or we need to change your treatment, we will call you to review the results.  Testing/Procedures: none  Follow-Up: At [facility], you and your health needs are our priority.  As part of our continuing mission to provide you with exceptional heart care, we have created designated Provider Care Teams.  These Care Teams include your primary Cardiologist (physician) and Advanced  Practice Providers (APPs -  Physician Assistants and Nurse Practitioners) who all work together to provide you with the care you need, when you need it. You will need a follow up appointment in 6 months.  Please call our office 2 months in advance to schedule this appointment.  You may see Elouise Munroe, MD or one of the following Advanced Practice Providers on your designated Care Team:   Rosaria Ferries, PA-C . Jory Sims, DNP, ANP  Any Other Special Instructions Will Be Listed Below (If Applicable).

## 2019-03-13 NOTE — Patient Instructions (Signed)
Medication Instructions:  Your physician recommends that you continue on your current medications as directed. Please refer to the Current Medication list given to you today. If you need a refill on your cardiac medications before your next appointment, please call your pharmacy.   Lab work: None  If you have labs (blood work) drawn today and your tests are completely normal, you will receive your results only by: Marland Kitchen MyChart Message (if you have MyChart) OR . A paper copy in the mail If you have any lab test that is abnormal or we need to change your treatment, we will call you to review the results.  Testing/Procedures: none  Follow-Up: At [facility], you and your health needs are our priority.  As part of our continuing mission to provide you with exceptional heart care, we have created designated Provider Care Teams.  These Care Teams include your primary Cardiologist (physician) and Advanced Practice Providers (APPs -  Physician Assistants and Nurse Practitioners) who all work together to provide you with the care you need, when you need it. You will need a follow up appointment in 6 months.  Please call our office 2 months in advance to schedule this appointment.  You may see Elouise Munroe, MD or one of the following Advanced Practice Providers on your designated Care Team:   Rosaria Ferries, PA-C . Jory Sims, DNP, ANP  Any Other Special Instructions Will Be Listed Below (If Applicable).

## 2019-03-13 NOTE — Telephone Encounter (Signed)
Per Dr Margaretann Loveless I contacted patient to see if we could move up her appointment to a sooner time. She declined due to being at work and thanked me for my call.

## 2019-05-25 ENCOUNTER — Other Ambulatory Visit: Payer: Self-pay

## 2019-05-25 DIAGNOSIS — Z20822 Contact with and (suspected) exposure to covid-19: Secondary | ICD-10-CM

## 2019-05-27 LAB — NOVEL CORONAVIRUS, NAA: SARS-CoV-2, NAA: NOT DETECTED

## 2019-07-18 ENCOUNTER — Other Ambulatory Visit: Payer: Self-pay

## 2019-07-18 DIAGNOSIS — Z20822 Contact with and (suspected) exposure to covid-19: Secondary | ICD-10-CM

## 2019-07-19 LAB — NOVEL CORONAVIRUS, NAA: SARS-CoV-2, NAA: NOT DETECTED

## 2019-07-29 ENCOUNTER — Other Ambulatory Visit: Payer: Self-pay

## 2019-07-29 DIAGNOSIS — Z20822 Contact with and (suspected) exposure to covid-19: Secondary | ICD-10-CM

## 2019-07-30 LAB — NOVEL CORONAVIRUS, NAA: SARS-CoV-2, NAA: NOT DETECTED

## 2019-11-04 ENCOUNTER — Ambulatory Visit: Payer: BLUE CROSS/BLUE SHIELD | Admitting: Internal Medicine

## 2019-12-04 ENCOUNTER — Encounter: Payer: Self-pay | Admitting: General Practice

## 2019-12-28 ENCOUNTER — Other Ambulatory Visit: Payer: Self-pay

## 2019-12-28 ENCOUNTER — Encounter: Payer: Self-pay | Admitting: Medical

## 2019-12-28 ENCOUNTER — Ambulatory Visit (INDEPENDENT_AMBULATORY_CARE_PROVIDER_SITE_OTHER): Payer: 59 | Admitting: Medical

## 2019-12-28 ENCOUNTER — Ambulatory Visit
Admission: RE | Admit: 2019-12-28 | Discharge: 2019-12-28 | Disposition: A | Discharge status: Home / Self Care | Payer: 59 | Source: Ambulatory Visit | Attending: Medical | Admitting: Medical

## 2019-12-28 VITALS — BP 128/84 | HR 86 | Temp 98.2°F | Ht 69.5 in | Wt 265.8 lb

## 2019-12-28 DIAGNOSIS — R0602 Shortness of breath: Secondary | ICD-10-CM | POA: Diagnosis not present

## 2019-12-28 DIAGNOSIS — U071 COVID-19: Secondary | ICD-10-CM

## 2019-12-28 DIAGNOSIS — J4541 Moderate persistent asthma with (acute) exacerbation: Secondary | ICD-10-CM | POA: Diagnosis not present

## 2019-12-28 DIAGNOSIS — M722 Plantar fascial fibromatosis: Secondary | ICD-10-CM | POA: Diagnosis not present

## 2019-12-28 HISTORY — DX: Plantar fascial fibromatosis: M72.2

## 2019-12-28 MED ORDER — PANTOPRAZOLE SODIUM 40 MG PO TBEC: 40.0000 mg | DELAYED_RELEASE_TABLET | Freq: Every day | ORAL | 2 refills | Status: DC

## 2019-12-28 MED ORDER — ALBUTEROL SULFATE (2.5 MG/3ML) 0.083% IN NEBU: 2.5000 mg | INHALATION_SOLUTION | Freq: Four times a day (QID) | RESPIRATORY_TRACT | 2 refills | Status: DC | PRN

## 2019-12-28 MED ORDER — ASPIRIN 81 MG PO TBEC: 81.0000 mg | DELAYED_RELEASE_TABLET | Freq: Every day | ORAL | 3 refills | Status: DC

## 2019-12-28 MED ORDER — ALBUTEROL SULFATE HFA 108 (90 BASE) MCG/ACT IN AERS: 2.0000 | INHALATION_SPRAY | RESPIRATORY_TRACT | 1 refills | Status: DC | PRN

## 2019-12-28 MED ORDER — BUDESONIDE-FORMOTEROL FUMARATE 160-4.5 MCG/ACT IN AERO: 2.0000 | INHALATION_SPRAY | Freq: Two times a day (BID) | RESPIRATORY_TRACT | 3 refills | Status: DC

## 2019-12-28 NOTE — Progress Notes (Signed)
Subjective:  [name] is a 44 y.o. female who presents for Chief Complaint  Patient presents with  . New Patient (Initial Visit)    medication refilled      Here as a new patient today.  Was seeing [facility] provider prior.   Was referred by another patient here.  Here to establish care.  Needs medicaiton refilled.  Ended up having covid infection in December, but has had a hard time getting over it.  Asthma is 10 x worse than before covid.  Has hx/o asthma. Prior to covid would still have to use inhaler often, but not getting good relief now.   walking winds her.   Has hard time walking from parking lot into building which is a long distance in general.   Been using Duoneb 3 times daily and albuterol every 4 hours.  Had done a round of prednisone and zpak in early December.  Did not get IV infusion for covid.  Doesn't see pulmonology.  Was not hospitalized for covid.  Has hx/o asthma.  Prior to covid was using Dulera.  Was not having good control of asthma even before covid, but covid made it worse.    Was seeing podiatry, had steroid injections for plantar fascitis, but still having lots of foot problems.  Needs new podiatrist due to insurance changes.   Having hard time standing on feet for long periods at work.   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     No other aggravating or relieving factors.    No other c/o.  The following portions of the patient's history were reviewed and updated as appropriate: allergies, current medications, past family history, past medical history, past social history, past surgical history and problem list.  ROS Otherwise as in subjective above  Past Medical History:  Diagnosis Date  . Abdominal pain   . Anemia   . Asthma   . Diarrhea   . Obesity   . Plantar fasciitis   . Shortness of breath   . Vomiting    Current Outpatient Medications on File Prior to Visit  Medication Sig Dispense Refill  . hydrocortisone cream 1 % Apply topically 2  (two) times daily as needed for itching. 30 g 0  . sertraline (ZOLOFT) 25 MG tablet Take 25 mg by mouth daily.    . nitroGLYCERIN (NITROSTAT) 0.4 MG SL tablet DIS 1 T UNT Q 5 MIN PRF CHEST PAIN FOR 3 DOSES  0   No current facility-administered medications on file prior to visit.     Objective: BP 128/84   Pulse 86   Temp 98.2 F (36.8 C)   Ht 5' 9.5" (1.765 m)   Wt 265 lb 12.8 oz (120.6 kg)   LMP 12/02/2019   SpO2 98%   BMI 38.69 kg/m   General appearance: alert, no distress, well developed, well nourished, African American female HEENT: normocephalic, sclerae anicteric, conjunctiva pink and moist, TMs pearly, nares patent, no discharge or erythema, pharynx normal Oral cavity: MMM, no lesions Neck: supple, no lymphadenopathy, no thyromegaly, no masses, no JVD, no bruits Heart: RRR, normal S1, S2, no murmurs Lungs: somewhat decreased bilaterally, no wheezes, rhonchi, or rales Pulses: 2+ radial pulses, 2+ pedal pulses, normal cap refill Ext: no edema No calve asymmetry or palpable cord, no tendnerss   EKG Shortness of breath indication, 87 bpm, PR 142 ms, QRS 78 ms, QTC 471 ms, axis 57 degrees, normal sinus rhythm   Assessment: Encounter Diagnoses  Name Primary?  Marland Kitchen  Moderate persistent asthma with acute exacerbation Yes  . COVID-19   . SOB (shortness of breath)   . Plantar fasciitis      Plan: Discussed medical history, medications, current symptoms of ongoing SOB and asthma flare up.   Reviewed chart records including EKG >1 year ago.  EKG reviewed today, will check some labs and get updated chest xray.     Asthma - begin samples of Symbicort.  Discussed proper use of Symbicort vs Albuterol.  discuss proper use of nebulizer device.    Discussed recheck, call back if not improving in the next 3-5 days.   Plantar fascitis - referral to podiatry here in [place] at her request.  [name] was seen today for new patient (initial visit).  Diagnoses and all orders  for this visit:  Moderate persistent asthma with acute exacerbation -     Cancel: Pulse oximetry (single); Future -     EKG 12-Lead  COVID-19 -     Cancel: Pulse oximetry (single); Future -     EKG 12-Lead  SOB (shortness of breath) -     Cancel: Pulse oximetry (single); Future -     EKG 12-Lead -     Basic metabolic panel -     CBC with Differential/Platelet -     DG Chest 2 View; Future -     Pulse oximetry (single)  Plantar fasciitis -     Ambulatory referral to Podiatry  Other orders -     albuterol (VENTOLIN HFA) 108 (90 Base) MCG/ACT inhaler; Inhale 2 puffs into the lungs every 4 (four) hours as needed for wheezing. -     albuterol (PROVENTIL) (2.5 MG/3ML) 0.083% nebulizer solution; Take 3 mLs (2.5 mg total) by nebulization every 6 (six) hours as needed for wheezing or shortness of breath. -     aspirin 81 MG EC tablet; Take 1 tablet (81 mg total) by mouth daily. -     pantoprazole (PROTONIX) 40 MG tablet; Take 1 tablet (40 mg total) by mouth daily. -     budesonide-formoterol (SYMBICORT) 160-4.5 MCG/ACT inhaler; Inhale 2 puffs into the lungs 2 (two) times daily.    Follow up: pending labs

## 2019-12-29 ENCOUNTER — Encounter: Payer: Self-pay | Admitting: Medical

## 2019-12-29 ENCOUNTER — Other Ambulatory Visit: Payer: Self-pay | Admitting: Medical

## 2019-12-29 LAB — BASIC METABOLIC PANEL
BUN/Creatinine Ratio: 18 (ref 9–23)
BUN: 14 mg/dL (ref 6–24)
CO2: 21 mmol/L (ref 20–29)
Calcium: 9.1 mg/dL (ref 8.7–10.2)
Chloride: 105 mmol/L (ref 96–106)
Creatinine, Ser: 0.76 mg/dL (ref 0.57–1.00)
GFR calc Af Amer: 110 mL/min/{1.73_m2} (ref >59)
GFR calc non Af Amer: 96 mL/min/{1.73_m2} (ref >59)
Glucose: 93 mg/dL (ref 65–99)
Potassium: 4 mmol/L (ref 3.5–5.2)
Sodium: 142 mmol/L (ref 134–144)

## 2019-12-29 LAB — CBC WITH DIFFERENTIAL/PLATELET
Basophils Absolute: 0 10*3/uL (ref 0.0–0.2)
Basos: 1 %
EOS (ABSOLUTE): 0.2 10*3/uL (ref 0.0–0.4)
Eos: 3 %
Hematocrit: 33.7 % — ABNORMAL LOW (ref 34.0–46.6)
Hemoglobin: 10.7 g/dL — ABNORMAL LOW (ref 11.1–15.9)
Immature Grans (Abs): 0 10*3/uL (ref 0.0–0.1)
Immature Granulocytes: 0 %
Lymphocytes Absolute: 2.4 10*3/uL (ref 0.7–3.1)
Lymphs: 38 %
MCH: 24 pg — ABNORMAL LOW (ref 26.6–33.0)
MCHC: 31.8 g/dL (ref 31.5–35.7)
MCV: 76 fL — ABNORMAL LOW (ref 79–97)
Monocytes Absolute: 0.5 10*3/uL (ref 0.1–0.9)
Monocytes: 7 %
Neutrophils Absolute: 3.2 10*3/uL (ref 1.4–7.0)
Neutrophils: 51 %
Platelets: 202 10*3/uL (ref 150–450)
RBC: 4.46 x10E6/uL (ref 3.77–5.28)
RDW: 13 % (ref 11.7–15.4)
WBC: 6.2 10*3/uL (ref 3.4–10.8)

## 2019-12-29 MED ORDER — FERROUS GLUCONATE 324 (38 FE) MG PO TABS: 324.0000 mg | ORAL_TABLET | Freq: Two times a day (BID) | ORAL | 2 refills | Status: DC

## 2019-12-29 MED ORDER — PREDNISONE 20 MG PO TABS: 40.0000 mg | ORAL_TABLET | Freq: Every day | ORAL | 0 refills | Status: DC

## 2019-12-29 NOTE — Progress Notes (Signed)
predniso

## 2019-12-29 NOTE — Progress Notes (Signed)
a 

## 2020-01-12 ENCOUNTER — Other Ambulatory Visit: Payer: Self-pay

## 2020-01-12 ENCOUNTER — Ambulatory Visit (INDEPENDENT_AMBULATORY_CARE_PROVIDER_SITE_OTHER): Payer: 59

## 2020-01-12 ENCOUNTER — Encounter: Payer: Self-pay | Admitting: Sports Medicine

## 2020-01-12 ENCOUNTER — Ambulatory Visit (INDEPENDENT_AMBULATORY_CARE_PROVIDER_SITE_OTHER): Payer: 59 | Admitting: Sports Medicine

## 2020-01-12 VITALS — BP 130/78 | HR 74 | Temp 96.2°F

## 2020-01-12 DIAGNOSIS — M722 Plantar fascial fibromatosis: Secondary | ICD-10-CM | POA: Diagnosis not present

## 2020-01-12 DIAGNOSIS — M79604 Pain in right leg: Secondary | ICD-10-CM | POA: Diagnosis not present

## 2020-01-12 MED ORDER — TRIAMCINOLONE ACETONIDE 10 MG/ML IJ SUSP
10.0000 mg | Freq: Once | INTRAMUSCULAR | Status: AC
  Administered 2020-01-12: 10 mg

## 2020-01-12 MED ORDER — DICLOFENAC SODIUM 75 MG PO TBEC: 75.0000 mg | DELAYED_RELEASE_TABLET | Freq: Two times a day (BID) | ORAL | 0 refills | Status: DC

## 2020-01-12 NOTE — Progress Notes (Signed)
Subjective: [name] is a 44 y.o. female patient presents to office with complaint of moderate heel pain on the left/right. Patient admits to post static dyskinesia for 2 weeks that started to come back admits to a history of plantar fasciitis was injected 1 year ago at a [facility] podiatry office and reports that she got a lot of relief and slowly the pain has started to come back reports that she does a lot of walking and standing on cement surfaces and works for [employer] parts.  Patient reports that her foot pain has gotten so severe that she is hurting all over. Patient has treated this problem with changing shoes with no relief. Denies any other pedal complaints.   Review of Systems  All other systems reviewed and are negative.    Patient Active Problem List   Diagnosis Date Noted  . COVID-19 12/28/2019  . Plantar fasciitis 12/28/2019  . Lobar pneumonia ([place]) 08/27/2018  . Asthma exacerbation 08/24/2018  . Menorrhagia with regular cycle 01/07/2015  . Chronic asthma 07/31/2013  . SOB (shortness of breath) 06/09/2013  . Moderate persistent asthma with acute exacerbation 01/01/2012    Current Outpatient Medications on File Prior to Visit  Medication Sig Dispense Refill  . albuterol (PROVENTIL) (2.5 MG/3ML) 0.083% nebulizer solution Take 3 mLs (2.5 mg total) by nebulization every 6 (six) hours as needed for wheezing or shortness of breath. 75 mL 2  . albuterol (VENTOLIN HFA) 108 (90 Base) MCG/ACT inhaler Inhale 2 puffs into the lungs every 4 (four) hours as needed for wheezing. 18 g 1  . aspirin 81 MG EC tablet Take 1 tablet (81 mg total) by mouth daily. 90 tablet 3  . budesonide-formoterol (SYMBICORT) 160-4.5 MCG/ACT inhaler Inhale 2 puffs into the lungs 2 (two) times daily. 1 Inhaler 3  . ferrous gluconate (FERGON) 324 MG tablet Take 1 tablet (324 mg total) by mouth 2 (two) times daily with a meal. 60 tablet 2  . hydrocortisone cream 1 % Apply topically 2 (two) times  daily as needed for itching. 30 g 0  . nitroGLYCERIN (NITROSTAT) 0.4 MG SL tablet DIS 1 T UNT Q 5 MIN PRF CHEST PAIN FOR 3 DOSES  0  . pantoprazole (PROTONIX) 40 MG tablet Take 1 tablet (40 mg total) by mouth daily. 30 tablet 2  . predniSONE (DELTASONE) 20 MG tablet Take 2 tablets (40 mg total) by mouth daily with breakfast. 10 tablet 0  . sertraline (ZOLOFT) 25 MG tablet Take 25 mg by mouth daily.     No current facility-administered medications on file prior to visit.    No Known Allergies  Objective: Physical Exam General: The patient is alert and oriented x3 in no acute distress.  Dermatology: Skin is warm, dry and supple bilateral lower extremities. Nails 1-10 are normal. There is no erythema, edema, no eccymosis, no open lesions present. Integument is otherwise unremarkable.  Vascular: Dorsalis Pedis pulse and Posterior Tibial pulse are 2/4 bilateral. Capillary fill time is immediate to all digits.  Neurological: Grossly intact to light touch with an achilles reflex of +2/5 and a  negative Tinel's sign bilateral.  Musculoskeletal: Tenderness to palpation at the medial calcaneal tubercale and through the insertion of the plantar fascia on the Right foot. No pain with compression of calcaneus bilateral. No pain with tuning fork to calcaneus bilateral. No pain with calf compression bilateral. There is decreased Ankle joint range of motion bilateral. All other joints range of motion within normal limits bilateral.  Bunion deformity.  Pes planus foot type.  Strength 5/5 in all groups bilateral.   Gait: Unassisted, Antalgic avoid weight on right heel  Xray, Right foot:  Normal osseous mineralization. Joint spaces preserved except at first metatarsophalangeal joint where there is mild deviation supportive of the early bunion deformity. No fracture/dislocation/boney destruction. Calcaneal spur present with mild thickening of plantar fascia. No other soft tissue abnormalities or radiopaque  foreign bodies.   Assessment and Plan: Problem List Items Addressed This Visit      Musculoskeletal and Integument   Plantar fasciitis - Primary   Relevant Medications   diclofenac (VOLTAREN) 75 MG EC tablet   Other Relevant Orders   DG Foot Complete Right    Other Visit Diagnoses    Pain of right lower extremity          -Complete examination performed.  -Xrays reviewed -Discussed with patient in detail the condition of plantar fasciitis, how this occurs and general treatment options. Explained both conservative and surgical treatments.  -After oral consent and aseptic prep, injected a mixture containing 1 ml of 2%  plain lidocaine, 1 ml 0.5% plain marcaine, 0.5 ml of kenalog 10 and 0.5 ml of dexamethasone phosphate into right heel. Post-injection care discussed with patient.  -Rx Diclofenac to take as instructed - Explained in detail the use of the fascial brace for the right which was dispensed at today's visit. -Explained and dispensed to patient daily stretching exercises. -Recommend patient to ice affected area 1-2x daily. -Patient to return to office in 4 weeks for follow up or sooner if problems or questions arise.  Advised patient in the future may benefit from custom orthotics to prevent flareup or reoccurrence of foot pain.  Landis Martins, DPM

## 2020-01-22 ENCOUNTER — Ambulatory Visit: Payer: 59 | Attending: Internal Medicine

## 2020-01-22 DIAGNOSIS — Z23 Encounter for immunization: Secondary | ICD-10-CM

## 2020-01-22 NOTE — Progress Notes (Signed)
   Covid-19 Vaccination Clinic  Name:  [name]    MRN: [id number] DOB: 1976-01-31  01/22/2020  [name] was observed post Covid-19 immunization for 15 minutes without incident. She was provided with Vaccine Information Sheet and instruction to access the V-Safe system.   [name] was instructed to call 911 with any severe reactions post vaccine: Marland Kitchen Difficulty breathing  . Swelling of face and throat  . A fast heartbeat  . A bad rash all over body  . Dizziness and weakness   Immunizations Administered    Name Date Dose VIS Date Route   Pfizer COVID-19 Vaccine 01/22/2020  3:52 PM 0.3 mL 10/09/2019 Intramuscular   Manufacturer: [place]   Lot: [id number]   [place]: [id number]

## 2020-02-16 ENCOUNTER — Ambulatory Visit: Payer: 59

## 2020-02-16 ENCOUNTER — Ambulatory Visit: Payer: 59 | Attending: Internal Medicine

## 2020-02-16 ENCOUNTER — Ambulatory Visit: Payer: 59 | Admitting: Sports Medicine

## 2020-02-16 DIAGNOSIS — Z23 Encounter for immunization: Secondary | ICD-10-CM

## 2020-02-16 NOTE — Progress Notes (Signed)
   Covid-19 Vaccination Clinic  Name:  [name]    MRN: [id number] DOB: [birth year]  02/16/2020  [name] was observed post Covid-19 immunization for 15 minutes without incident. She was provided with Vaccine Information Sheet and instruction to access the V-Safe system.   [name] was instructed to call 911 with any severe reactions post vaccine: Marland Kitchen Difficulty breathing  . Swelling of face and throat  . A fast heartbeat  . A bad rash all over body  . Dizziness and weakness   Immunizations Administered    Name Date Dose VIS Date Route   Pfizer COVID-19 Vaccine 02/16/2020  1:23 PM 0.3 mL 12/23/2018 Intramuscular   Manufacturer: [place]   Lot: [id number]   [place]: [id number]

## 2020-03-21 ENCOUNTER — Ambulatory Visit: Payer: 59 | Admitting: Medical

## 2020-03-21 ENCOUNTER — Encounter: Payer: Self-pay | Admitting: Medical

## 2020-03-21 ENCOUNTER — Other Ambulatory Visit: Payer: Self-pay

## 2020-03-21 VITALS — BP 122/74 | HR 68 | Ht 69.5 in | Wt 262.2 lb

## 2020-03-21 DIAGNOSIS — J4541 Moderate persistent asthma with (acute) exacerbation: Secondary | ICD-10-CM

## 2020-03-21 DIAGNOSIS — Z8249 Family history of ischemic heart disease and other diseases of the circulatory system: Secondary | ICD-10-CM

## 2020-03-21 DIAGNOSIS — T50905A Adverse effect of unspecified drugs, medicaments and biological substances, initial encounter: Secondary | ICD-10-CM

## 2020-03-21 DIAGNOSIS — N920 Excessive and frequent menstruation with regular cycle: Secondary | ICD-10-CM

## 2020-03-21 DIAGNOSIS — R0602 Shortness of breath: Secondary | ICD-10-CM

## 2020-03-21 DIAGNOSIS — R110 Nausea: Secondary | ICD-10-CM

## 2020-03-21 DIAGNOSIS — D649 Anemia, unspecified: Secondary | ICD-10-CM | POA: Diagnosis not present

## 2020-03-21 HISTORY — DX: Family history of ischemic heart disease and other diseases of the circulatory system: Z82.49

## 2020-03-21 NOTE — Progress Notes (Signed)
Subjective:  [name] is a 44 y.o. female who presents for Chief Complaint  Patient presents with  . Medical Clearance    need to return to work due to sore throat and body ache and pain     Here for need for note to get back into work.  She feels fine today.  She notes in the first week of May she felt her asthma flaring up including shortness of breath, cough, sore throat.  She has significant asthma in general.  She gets flareups somewhat regularly.  She is using Symbicort 2 puffs twice daily, albuterol both nebulized and handheld.  She was on [facility] before switching to Symbicort earlier this year.  Although it has helped, she still gets fairly frequent flareups of asthma.  She is a non-smoker.  She is on medicine for GERD.  She was on Singulair in the past, which helped.  She denies history of DVT or blood clots but has had a miscarriage.  Her mom did have a DVT in the past.  No recent sick contacts.  She did have Covid back in November 2020 and resolved from that.  She has a history of anemia, iron deficiency.  She is taking iron twice daily although this gives her nausea and  constipation.  She has heavy menstruation.  She had a period a month ago and bled for mostly a month.   her gynecologist has already recommended hysterectomy due to heavy bleeding.  She has never had a colonoscopy other aggravating or relieving factors.    No other c/o.  Past Medical History:  Diagnosis Date  . Abdominal pain   . Anemia   . Asthma   . Diarrhea   . Obesity   . Plantar fasciitis   . Shortness of breath   . Vomiting    Current Outpatient Medications on File Prior to Visit  Medication Sig Dispense Refill  . albuterol (PROVENTIL) (2.5 MG/3ML) 0.083% nebulizer solution Take 3 mLs (2.5 mg total) by nebulization every 6 (six) hours as needed for wheezing or shortness of breath. 75 mL 2  . albuterol (VENTOLIN HFA) 108 (90 Base) MCG/ACT inhaler Inhale 2 puffs into the lungs every 4 (four)  hours as needed for wheezing. 18 g 1  . budesonide-formoterol (SYMBICORT) 160-4.5 MCG/ACT inhaler Inhale 2 puffs into the lungs 2 (two) times daily. 1 Inhaler 3  . diclofenac (VOLTAREN) 75 MG EC tablet Take 1 tablet (75 mg total) by mouth 2 (two) times daily. 30 tablet 0  . pantoprazole (PROTONIX) 40 MG tablet Take 1 tablet (40 mg total) by mouth daily. 30 tablet 2  . aspirin 81 MG EC tablet Take 1 tablet (81 mg total) by mouth daily. (Patient not taking: Reported on 03/21/2020) 90 tablet 3  . ferrous gluconate (FERGON) 324 MG tablet Take 1 tablet (324 mg total) by mouth 2 (two) times daily with a meal. (Patient not taking: Reported on 03/21/2020) 60 tablet 2  . hydrocortisone cream 1 % Apply topically 2 (two) times daily as needed for itching. (Patient not taking: Reported on 03/21/2020) 30 g 0  . nitroGLYCERIN (NITROSTAT) 0.4 MG SL tablet DIS 1 T UNT Q 5 MIN PRF CHEST PAIN FOR 3 DOSES  0  . sertraline (ZOLOFT) 25 MG tablet Take 25 mg by mouth daily.     No current facility-administered medications on file prior to visit.     The following portions of the patient's history were reviewed and updated as appropriate: allergies, current  medications, past family history, past medical history, past social history, past surgical history and problem list.  ROS Otherwise as in subjective above  Objective: BP 122/74   Pulse 68   Ht 5' 9.5" (1.765 m)   Wt 262 lb 3.2 oz (118.9 kg)   SpO2 99%   BMI 38.17 kg/m   General appearance: alert, no distress, well developed, well nourished HEENT: normocephalic, sclerae anicteric, conjunctiva pink and moist, TMs pearly, nares patent, no discharge or erythema, pharynx normal Oral cavity: MMM, no lesions Neck: supple, no lymphadenopathy, no thyromegaly, no masses Heart: RRR, normal S1, S2, no murmurs Lungs: CTA bilaterally, no wheezes, rhonchi, or rales Abdomen: +bs, soft, non tender, non distended, no masses, no hepatomegaly, no splenomegaly Pulses: 2+  radial pulses, 2+ pedal pulses, normal cap refill Ext: no edema calves and legs nontender, no swelling, -homans   Assessment: Encounter Diagnoses  Name Primary?  . SOB (shortness of breath) Yes  . Moderate persistent asthma with acute exacerbation   . Menorrhagia with regular cycle   . Anemia, unspecified type   . Family history of DVT   . Adverse effect of drug, initial encounter   . Nausea      Plan: Asthma, shortness of breath-labs today for further evaluation.  Improved compared to the last 2-3 weeks.  Low risk for PE/DVT, lung sounds clear today.  Continue Symbicort, continue albuterol, discussed difference between preventative and rescue inhalers.  Will likely add Singulair.  Continue medication for GERD, advised to avoid triggers.  If not improving adding Singulair over the next month may need to see asthma specialist  Heavy uterine bleeding likely causing anemia.  Continue iron therapy.  Advise she follow-up with gynecology  Anemia-not really tolerating ferrous gluconate.  Consider change to polysaccharide iron   [name] was seen today for medical clearance.  Diagnoses and all orders for this visit:  SOB (shortness of breath) -     Basic metabolic panel  Moderate persistent asthma with acute exacerbation  Menorrhagia with regular cycle -     CBC with Differential/Platelet -     Iron  Anemia, unspecified type -     CBC with Differential/Platelet -     Iron -     Basic metabolic panel  Family history of DVT  Adverse effect of drug, initial encounter  Nausea    Follow up: pending labs

## 2020-03-22 ENCOUNTER — Other Ambulatory Visit: Payer: Self-pay | Admitting: Medical

## 2020-03-22 ENCOUNTER — Ambulatory Visit: Payer: 59 | Admitting: Sports Medicine

## 2020-03-22 LAB — CBC WITH DIFFERENTIAL/PLATELET
Basophils Absolute: 0 10*3/uL (ref 0.0–0.2)
Basos: 1 %
EOS (ABSOLUTE): 0.1 10*3/uL (ref 0.0–0.4)
Eos: 2 %
Hematocrit: 36.2 % (ref 34.0–46.6)
Hemoglobin: 11.2 g/dL (ref 11.1–15.9)
Immature Grans (Abs): 0 10*3/uL (ref 0.0–0.1)
Immature Granulocytes: 0 %
Lymphocytes Absolute: 2.2 10*3/uL (ref 0.7–3.1)
Lymphs: 38 %
MCH: 23.2 pg — ABNORMAL LOW (ref 26.6–33.0)
MCHC: 30.9 g/dL — ABNORMAL LOW (ref 31.5–35.7)
MCV: 75 fL — ABNORMAL LOW (ref 79–97)
Monocytes Absolute: 0.5 10*3/uL (ref 0.1–0.9)
Monocytes: 8 %
Neutrophils Absolute: 3 10*3/uL (ref 1.4–7.0)
Neutrophils: 51 %
Platelets: 227 10*3/uL (ref 150–450)
RBC: 4.82 x10E6/uL (ref 3.77–5.28)
RDW: 13.5 % (ref 11.7–15.4)
WBC: 5.9 10*3/uL (ref 3.4–10.8)

## 2020-03-22 LAB — BASIC METABOLIC PANEL
BUN/Creatinine Ratio: 17 (ref 9–23)
BUN: 12 mg/dL (ref 6–24)
CO2: 21 mmol/L (ref 20–29)
Calcium: 9.1 mg/dL (ref 8.7–10.2)
Chloride: 108 mmol/L — ABNORMAL HIGH (ref 96–106)
Creatinine, Ser: 0.69 mg/dL (ref 0.57–1.00)
GFR calc Af Amer: 122 mL/min/{1.73_m2} (ref >59)
GFR calc non Af Amer: 106 mL/min/{1.73_m2} (ref >59)
Glucose: 82 mg/dL (ref 65–99)
Potassium: 4.4 mmol/L (ref 3.5–5.2)
Sodium: 140 mmol/L (ref 134–144)

## 2020-03-22 LAB — IRON: Iron: 88 ug/dL (ref 27–159)

## 2020-03-22 MED ORDER — TANDEM 162-115.2 MG PO CAPS: 1.0000 | ORAL_CAPSULE | Freq: Every day | ORAL | 2 refills | Status: DC

## 2020-03-22 MED ORDER — MONTELUKAST SODIUM 10 MG PO TABS
10.0000 mg | ORAL_TABLET | Freq: Every day | ORAL | 2 refills | Status: DC
Start: 2020-03-22 — End: 2020-05-10

## 2020-03-22 MED ORDER — BUDESONIDE-FORMOTEROL FUMARATE 160-4.5 MCG/ACT IN AERO: 2.0000 | INHALATION_SPRAY | Freq: Two times a day (BID) | RESPIRATORY_TRACT | 3 refills | Status: DC

## 2020-03-22 MED ORDER — ALBUTEROL SULFATE HFA 108 (90 BASE) MCG/ACT IN AERS: 2.0000 | INHALATION_SPRAY | RESPIRATORY_TRACT | 0 refills | Status: DC | PRN

## 2020-03-22 MED ORDER — ALBUTEROL SULFATE (2.5 MG/3ML) 0.083% IN NEBU: 2.5000 mg | INHALATION_SOLUTION | Freq: Four times a day (QID) | RESPIRATORY_TRACT | 1 refills | Status: DC | PRN

## 2020-05-09 ENCOUNTER — Encounter: Payer: Self-pay | Admitting: Medical

## 2020-05-09 ENCOUNTER — Other Ambulatory Visit: Payer: Self-pay

## 2020-05-09 ENCOUNTER — Ambulatory Visit (INDEPENDENT_AMBULATORY_CARE_PROVIDER_SITE_OTHER): Payer: 59 | Admitting: Medical

## 2020-05-09 VITALS — BP 116/80 | HR 75 | Ht 69.5 in | Wt 265.2 lb

## 2020-05-09 DIAGNOSIS — M722 Plantar fascial fibromatosis: Secondary | ICD-10-CM

## 2020-05-09 DIAGNOSIS — Z1231 Encounter for screening mammogram for malignant neoplasm of breast: Secondary | ICD-10-CM | POA: Diagnosis not present

## 2020-05-09 DIAGNOSIS — Z8249 Family history of ischemic heart disease and other diseases of the circulatory system: Secondary | ICD-10-CM

## 2020-05-09 DIAGNOSIS — Z0000 Encounter for general adult medical examination without abnormal findings: Secondary | ICD-10-CM | POA: Insufficient documentation

## 2020-05-09 DIAGNOSIS — J45909 Unspecified asthma, uncomplicated: Secondary | ICD-10-CM | POA: Diagnosis not present

## 2020-05-09 DIAGNOSIS — Z1211 Encounter for screening for malignant neoplasm of colon: Secondary | ICD-10-CM

## 2020-05-09 DIAGNOSIS — D509 Iron deficiency anemia, unspecified: Secondary | ICD-10-CM

## 2020-05-09 DIAGNOSIS — Z7185 Encounter for immunization safety counseling: Secondary | ICD-10-CM | POA: Insufficient documentation

## 2020-05-09 DIAGNOSIS — Z7189 Other specified counseling: Secondary | ICD-10-CM

## 2020-05-09 NOTE — Progress Notes (Signed)
Subjective:   HPI  [name] is a 44 y.o. female who presents for Chief Complaint  Patient presents with  . Form Completion    Patient Care Team: Erminie Foulks, Camelia Eng, PA-C as PCP - General (Family Medicine) Elouise Munroe, MD as PCP - Cardiology (Cardiology) Sees dentist Sees eye doctor  Concerns: Last tetanus booster 2019 with a hospital visit 2019 hospital  History of iron deficiency anemia-she is compliant with iron daily   GERD-compliant with Protonix regularly  Past Medical History:  Diagnosis Date  . Abdominal pain   . Anemia   . Asthma   . Diarrhea   . Obesity   . Plantar fasciitis   . Shortness of breath   . Vomiting     Past Surgical History:  Procedure Laterality Date  . CHOLECYSTECTOMY  06/19/11  . IR RADIOLOGY PERIPHERAL GUIDED IV START  10/23/2018  . IR US GUIDE VASC ACCESS RIGHT  10/23/2018  . TUBAL LIGATION      Social History   Socioeconomic History  . Marital status: Single    Spouse name: Not on file  . Number of children: Not on file  . Years of education: Not on file  . Highest education level: Not on file  Occupational History  . Not on file  Tobacco Use  . Smoking status: Never Smoker  . Smokeless tobacco: Never Used  Vaping Use  . Vaping Use: Never used  Substance and Sexual Activity  . Alcohol use: No  . Drug use: No  . Sexual activity: Not Currently    Birth control/protection: None  Other Topics Concern  . Not on file  Social History Narrative   Lives with husband, son, dog.   Exercise - walking.  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   Social Determinants of Health   Financial Resource Strain:   . Difficulty of Paying Living Expenses:   Food Insecurity:   . Worried About Charity fundraiser in the Last Year:   . Arboriculturist in the Last Year:   Transportation Needs:   . Film/video editor (Medical):   Marland Kitchen Lack of Transportation (Non-Medical):   Physical Activity:   . Days of Exercise per  Week:   . Minutes of Exercise per Session:   Stress:   . Feeling of Stress :   Social Connections:   . Frequency of Communication with Friends and Family:   . Frequency of Social Gatherings with Friends and Family:   . Attends Religious Services:   . Active Member of Clubs or Organizations:   . Attends Archivist Meetings:   Marland Kitchen Marital Status:   Intimate Partner Violence:   . Fear of Current or Ex-Partner:   . Emotionally Abused:   Marland Kitchen Physically Abused:   . Sexually Abused:     Family History  Problem Relation Age of Onset  . Diabetes Mother 30  . Hypertension Mother   . Other Mother        sepsis  . Emphysema Father 73       smoked  . Heart disease Father 54       MI  . Emphysema Maternal Aunt        smoked  . Lung cancer Maternal Aunt        smoked  . Heart disease Maternal Grandfather   . Heart disease Maternal Aunt   . Heart disease Sister 65       MI  .  Cancer Brother   . Cancer Maternal Uncle   . Cancer Maternal Grandmother   . Heart disease Sister   . Aneurysm Brother      Current Outpatient Medications:  .  albuterol (PROVENTIL) (2.5 MG/3ML) 0.083% nebulizer solution, Take 3 mLs (2.5 mg total) by nebulization every 6 (six) hours as needed for wheezing or shortness of breath., Disp: 75 mL, Rfl: 1 .  albuterol (VENTOLIN HFA) 108 (90 Base) MCG/ACT inhaler, Inhale 2 puffs into the lungs every 4 (four) hours as needed for wheezing., Disp: 18 g, Rfl: 0 .  aspirin 81 MG EC tablet, Take 1 tablet (81 mg total) by mouth daily., Disp: 90 tablet, Rfl: 3 .  budesonide-formoterol (SYMBICORT) 160-4.5 MCG/ACT inhaler, Inhale 2 puffs into the lungs 2 (two) times daily., Disp: 1 Inhaler, Rfl: 3 .  diclofenac (VOLTAREN) 75 MG EC tablet, Take 1 tablet (75 mg total) by mouth 2 (two) times daily., Disp: 30 tablet, Rfl: 0 .  ferrous fumarate-iron polysaccharide complex (TANDEM) 162-115.2 MG CAPS capsule, Take 1 capsule by mouth daily with breakfast., Disp: 30 capsule, Rfl:  2 .  montelukast (SINGULAIR) 10 MG tablet, Take 1 tablet (10 mg total) by mouth daily., Disp: 30 tablet, Rfl: 2 .  pantoprazole (PROTONIX) 40 MG tablet, Take 1 tablet (40 mg total) by mouth daily., Disp: 30 tablet, Rfl: 2 .  sertraline (ZOLOFT) 25 MG tablet, Take 25 mg by mouth daily., Disp: , Rfl:  .  nitroGLYCERIN (NITROSTAT) 0.4 MG SL tablet, DIS 1 T UNT Q 5 MIN PRF CHEST PAIN FOR 3 DOSES (Patient not taking: Reported on 05/09/2020), Disp: , Rfl: 0  No Known Allergies    Reviewed their medical, surgical, family, social, medication, and allergy history and updated chart as appropriate.   Review of Systems Constitutional: -fever, -chills, -sweats, -unexpected weight change, -decreased appetite, -fatigue Allergy: -sneezing, -itching, -congestion Dermatology: -changing moles, --rash, -lumps ENT: -runny nose, -ear pain, -sore throat, -hoarseness, -sinus pain, -teeth pain, - ringing in ears, -hearing loss, -nosebleeds Cardiology: -chest pain, -palpitations, -swelling, -difficulty breathing when lying flat, -waking up short of breath Respiratory: -cough, -shortness of breath, -difficulty breathing with exercise or exertion, -wheezing, -coughing up blood Gastroenterology: -abdominal pain, -nausea, -vomiting, -diarrhea, -constipation, -blood in stool, -changes in bowel movement, -difficulty swallowing or eating Hematology: -bleeding, -bruising  Musculoskeletal: -joint aches, -muscle aches, -joint swelling, -back pain, -neck pain, -cramping, -changes in gait Ophthalmology: denies vision changes, eye redness, itching, discharge Urology: -burning with urination, -difficulty urinating, -blood in urine, -urinary frequency, -urgency, -incontinence Neurology: -headache, -weakness, -tingling, -numbness, -memory loss, -falls, -dizziness Psychology: -depressed mood, -agitation, -sleep problems Breast/gyn: -breast tendnerss, -discharge, -lumps, -vaginal discharge,- irregular periods, -heavy periods      Objective:  BP 116/80   Pulse 75   Ht 5' 9.5" (1.765 m)   Wt 265 lb 3.2 oz (120.3 kg)   SpO2 96%   BMI 38.60 kg/m   General appearance: alert, no distress, WD/WN, female Skin: Unremarkable Neck: supple, no lymphadenopathy, no thyromegaly, no masses, normal ROM, no bruits Chest: non tender, normal shape and expansion Heart: RRR, normal S1, S2, no murmurs Lungs: CTA bilaterally, no wheezes, rhonchi, or rales Abdomen: +bs, soft, non tender, non distended, no masses, no hepatomegaly, no splenomegaly, no bruits Back: non tender, normal ROM, no scoliosis Musculoskeletal: upper extremities non tender, no obvious deformity, normal ROM throughout, lower extremities non tender, no obvious deformity, normal ROM throughout Extremities: no edema, no cyanosis, no clubbing Pulses: 2+ symmetric, upper and lower extremities, normal  cap refill Neurological: alert, oriented x 3, CN2-12 intact, strength normal upper extremities and lower extremities, sensation normal throughout, DTRs 2+ throughout, no cerebellar signs, gait normal Psychiatric: normal affect, behavior normal, pleasant  Breast/gyn/rectal - deferred to gynecology     Assessment and Plan :   Encounter Diagnoses  Name Primary?  . Encounter for health maintenance examination in adult Yes  . Encounter for screening mammogram for malignant neoplasm of breast   . Screen for colon cancer   . Chronic asthma, unspecified asthma severity, unspecified whether complicated, unspecified whether persistent   . Iron deficiency anemia, unspecified iron deficiency anemia type   . Family history of DVT   . Plantar fasciitis   . Vaccine counseling     Physical exam - discussed and counseled on healthy lifestyle, diet, exercise, preventative care, vaccinations, sick and well care, proper use of emergency dept and after hours care, and addressed their concerns.    Health screening: Advised they see their eye doctor yearly for routine vision  care. Advised they see their dentist yearly for routine dental care including hygiene visits twice yearly.   Cancer screening Counseled on self breast exams, mammograms, cervical cancer screening Advise she check insurance coverage for screening colonoscopy  Advised mammogram  I reviewed recent 2019 Pap smear with HPV negative   Vaccinations: Advised yearly influenza vaccine She reports being up-to-date on tetanus shot in 2019 Recommended Covid vaccine   Separate significant chronic issues discussed: I completed her work health wellness screening form   Marilyne was seen today for form completion.  Diagnoses and all orders for this visit:  Encounter for health maintenance examination in adult -     Hepatic function panel -     Lipid panel -     Hepatitis C antibody -     MM DIGITAL SCREENING BILATERAL; Future -     Ambulatory referral to Gastroenterology -     TSH  Encounter for screening mammogram for malignant neoplasm of breast -     MM DIGITAL SCREENING BILATERAL; Future  Screen for colon cancer  Chronic asthma, unspecified asthma severity, unspecified whether complicated, unspecified whether persistent  Iron deficiency anemia, unspecified iron deficiency anemia type  Family history of DVT  Plantar fasciitis  Vaccine counseling    Follow-up pending labs, yearly for physical

## 2020-05-10 ENCOUNTER — Other Ambulatory Visit: Payer: Self-pay | Admitting: Medical

## 2020-05-10 LAB — TSH: TSH: 1.38 u[IU]/mL (ref 0.450–4.500)

## 2020-05-10 LAB — HEPATIC FUNCTION PANEL
ALT: 15 IU/L (ref 0–32)
AST: 16 IU/L (ref 0–40)
Albumin: 4 g/dL (ref 3.8–4.8)
Alkaline Phosphatase: 60 IU/L (ref 48–121)
Bilirubin Total: 0.2 mg/dL (ref 0.0–1.2)
Bilirubin, Direct: 0.1 mg/dL (ref 0.00–0.40)
Total Protein: 6.6 g/dL (ref 6.0–8.5)

## 2020-05-10 LAB — LIPID PANEL
Chol/HDL Ratio: 3 ratio (ref 0.0–4.4)
Cholesterol, Total: 173 mg/dL (ref 100–199)
HDL: 58 mg/dL (ref >39)
LDL Chol Calc (NIH): 95 mg/dL (ref 0–99)
Triglycerides: 109 mg/dL (ref 0–149)
VLDL Cholesterol Cal: 20 mg/dL (ref 5–40)

## 2020-05-10 LAB — HEPATITIS C ANTIBODY: Hep C Virus Ab: <0.1 s/co ratio (ref 0.0–0.9)

## 2020-05-10 MED ORDER — MONTELUKAST SODIUM 10 MG PO TABS: 10.0000 mg | ORAL_TABLET | Freq: Every day | ORAL | 3 refills | Status: DC

## 2020-05-10 MED ORDER — ALBUTEROL SULFATE HFA 108 (90 BASE) MCG/ACT IN AERS: 2.0000 | INHALATION_SPRAY | RESPIRATORY_TRACT | 1 refills | Status: DC | PRN

## 2020-05-10 MED ORDER — BUDESONIDE-FORMOTEROL FUMARATE 160-4.5 MCG/ACT IN AERO: 2.0000 | INHALATION_SPRAY | Freq: Two times a day (BID) | RESPIRATORY_TRACT | 11 refills | Status: DC

## 2020-05-23 ENCOUNTER — Other Ambulatory Visit: Payer: Self-pay

## 2020-05-23 ENCOUNTER — Ambulatory Visit
Admission: RE | Admit: 2020-05-23 | Discharge: 2020-05-23 | Disposition: A | Discharge status: Home / Self Care | Payer: 59 | Source: Ambulatory Visit | Attending: Medical | Admitting: Medical

## 2020-05-23 DIAGNOSIS — Z1231 Encounter for screening mammogram for malignant neoplasm of breast: Secondary | ICD-10-CM

## 2020-05-23 DIAGNOSIS — Z0000 Encounter for general adult medical examination without abnormal findings: Secondary | ICD-10-CM

## 2020-06-08 ENCOUNTER — Encounter: Payer: Self-pay | Admitting: Gastroenterology

## 2020-07-07 ENCOUNTER — Telehealth (INDEPENDENT_AMBULATORY_CARE_PROVIDER_SITE_OTHER): Payer: 59 | Admitting: Family Medicine

## 2020-07-07 ENCOUNTER — Encounter: Payer: Self-pay | Admitting: Family Medicine

## 2020-07-07 ENCOUNTER — Encounter: Payer: Self-pay | Admitting: Medical

## 2020-07-07 VITALS — Temp 102.0°F | Wt 260.0 lb

## 2020-07-07 DIAGNOSIS — R6889 Other general symptoms and signs: Secondary | ICD-10-CM

## 2020-07-07 DIAGNOSIS — J45909 Unspecified asthma, uncomplicated: Secondary | ICD-10-CM

## 2020-07-07 NOTE — Progress Notes (Signed)
° °  Subjective:    Patient ID: [name], female    DOB: [birth year], 44 y.o.   MRN: [id number]  HPI Chief Complaint  Patient presents with   booster shot    had booster shot 2 days ago, cold symptoms, feels like she has the flu, headache, fever chills   Covid vaccine 2 days ago. [place] booster.   Complains of fever, body aches, headache, and flu like symptoms which started approximately 6 hours after receiving her vaccine. States she also has nasal congestion and rhinorrhea. Reports having asthma which is usually controlled but feels like it is flaring up. Reports having chest tightness and shortness of breath.   States she has taken Mucinex and Tylenol cold plus. Taking her usual asthma medication and has been using her albuterol nebulizer.   Reviewed allergies, medications, past medical, surgical, family, and social history.   Review of Systems Pertinent positives and negatives in the history of present illness.     Objective:   Physical Exam Temp (!) 102 F (38.9 C)    Wt 260 lb (117.9 kg)    BMI 37.84 kg/m   Alert and oriented and visibly ill. Respirations unlabored and speaking in complete sentences. Normal speech.       Assessment & Plan:  Flu-like symptoms  Chronic asthma, unspecified asthma severity, unspecified whether complicated, unspecified whether persistent  She has been feeling ill since getting her Covid booster. Underlying asthma which is typically controlled.  Discussed her body is responding to the booster shot and recommend symptomatic treatment with Tylenol and ibuprofen alternating. Also Mucinex and treating her asthma.  She will try to stay well hydrated, rest and follow up if worsening or not back to baseline next week.

## 2020-07-27 ENCOUNTER — Ambulatory Visit (AMBULATORY_SURGERY_CENTER): Payer: 59 | Admitting: *Deleted

## 2020-07-27 ENCOUNTER — Other Ambulatory Visit: Payer: Self-pay

## 2020-07-27 VITALS — Ht 69.5 in | Wt 261.0 lb

## 2020-07-27 DIAGNOSIS — Z1211 Encounter for screening for malignant neoplasm of colon: Secondary | ICD-10-CM

## 2020-07-27 MED ORDER — NA SULFATE-K SULFATE-MG SULF 17.5-3.13-1.6 GM/177ML PO SOLN: ORAL | 0 refills | Status: DC

## 2020-07-27 NOTE — Progress Notes (Signed)
Patient is here in-person for PV. Patient denies any allergies to eggs or soy. Patient denies any problems with anesthesia/sedation. Patient denies any oxygen use at home. Patient denies taking any diet/weight loss medications or blood thinners. Patient is not being treated for MRSA or C-diff. Patient is aware of our care-partner policy and [id number] safety protocol. EMMI education assisgned to the patient for the procedure, sent to MyChart.   COVID-19 vaccines completed on 02/16/20, per patient.

## 2020-08-11 ENCOUNTER — Encounter: Payer: Self-pay | Admitting: Gastroenterology

## 2020-08-11 ENCOUNTER — Other Ambulatory Visit: Payer: Self-pay

## 2020-08-11 ENCOUNTER — Ambulatory Visit (AMBULATORY_SURGERY_CENTER): Payer: 59 | Admitting: Gastroenterology

## 2020-08-11 VITALS — BP 112/57 | HR 64 | Temp 97.1°F | Resp 15 | Ht 69.0 in | Wt 261.0 lb

## 2020-08-11 DIAGNOSIS — Z1211 Encounter for screening for malignant neoplasm of colon: Secondary | ICD-10-CM | POA: Diagnosis not present

## 2020-08-11 DIAGNOSIS — D125 Benign neoplasm of sigmoid colon: Secondary | ICD-10-CM | POA: Diagnosis not present

## 2020-08-11 MED ORDER — SODIUM CHLORIDE 0.9 % IV SOLN: 500.0000 mL | INTRAVENOUS | Status: DC

## 2020-08-11 NOTE — Patient Instructions (Signed)

## 2020-08-11 NOTE — Progress Notes (Signed)
Report to PACU, RN, vss, BBS= Clear.  

## 2020-08-11 NOTE — Op Note (Signed)
[facility] Patient Name: [name] Procedure Date: 08/11/2020 11:08 AM MRN: [id number] Endoscopist: Remo Lipps P. Havery Moros , MD Age: 44 Referring MD:  Date of Birth: 08/01/1976 Gender: Female Account #: [id number] Procedure:                Colonoscopy Indications:              Screening for colorectal malignant neoplasm, This                            is the patient's first colonoscopy Medicines:                Monitored Anesthesia Care Procedure:                Pre-Anesthesia Assessment:                           - Prior to the procedure, a History and Physical                            was performed, and patient medications and                            allergies were reviewed. The patient's tolerance of                            previous anesthesia was also reviewed. The risks                            and benefits of the procedure and the sedation                            options and risks were discussed with the patient.                            All questions were answered, and informed consent                            was obtained. Prior Anticoagulants: The patient has                            taken no previous anticoagulant or antiplatelet                            agents. ASA Grade Assessment: II - A patient with                            mild systemic disease. After reviewing the risks                            and benefits, the patient was deemed in                            satisfactory condition to undergo the procedure.  After obtaining informed consent, the colonoscope                            was passed under direct vision. Throughout the                            procedure, the patient's blood pressure, pulse, and                            oxygen saturations were monitored continuously. The                            Colonoscope was introduced through the anus and                            advanced to the the  cecum, identified by                            appendiceal orifice and ileocecal valve. The                            colonoscopy was performed without difficulty. The                            patient tolerated the procedure well. The quality                            of the bowel preparation was good. The ileocecal                            valve, appendiceal orifice, and rectum were                            photographed. Scope In: 11:12:40 AM Scope Out: 11:30:33 AM Scope Withdrawal Time: 0 hours 14 minutes 58 seconds  Total Procedure Duration: 0 hours 17 minutes 53 seconds  Findings:                 The perianal and digital rectal examinations were                            normal.                           Multiple medium-mouthed diverticula were found in                            the left colon and right colon.                           A 3 mm polyp was found in the sigmoid colon. The                            polyp was sessile. The polyp was removed with a  cold snare. Resection and retrieval were complete.                           Internal hemorrhoids were found during                            retroflexion. The hemorrhoids were small.                           The exam was otherwise without abnormality. Complications:            No immediate complications. Estimated blood loss:                            Minimal. Estimated Blood Loss:     Estimated blood loss was minimal. Impression:               - Diverticulosis in the left colon and in the right                            colon.                           - One 3 mm polyp in the sigmoid colon, removed with                            a cold snare. Resected and retrieved.                           - Internal hemorrhoids.                           - The examination was otherwise normal. Recommendation:           - Patient has a contact number available for                             emergencies. The signs and symptoms of potential                            delayed complications were discussed with the                            patient. Return to normal activities tomorrow.                            Written discharge instructions were provided to the                            patient.                           - Resume previous diet.                           - Continue present medications.                           -  Await pathology results. Remo Lipps P. [name], MD 08/11/2020 11:33:58 AM This report has been signed electronically.

## 2020-08-11 NOTE — Progress Notes (Signed)
V/s SH Pt's states no medical or surgical changes since previsit or office visit.

## 2020-08-15 ENCOUNTER — Telehealth: Payer: Self-pay

## 2020-08-15 NOTE — Telephone Encounter (Signed)
  Follow up Call-  Call back number 08/11/2020  Post procedure Call Back phone  # [phone]  Permission to leave phone message Yes  Some recent data might be hidden     Patient questions:  Do you have a fever, pain , or abdominal swelling? No. Pain Score  0 *  Have you tolerated food without any problems? Yes.    Have you been able to return to your normal activities? Yes.    Do you have any questions about your discharge instructions: Diet   No. Medications  No. Follow up visit  No.  Do you have questions or concerns about your Care? No.  Actions: * If pain score is 4 or above: No action needed, pain <4.  1. Have you developed a fever since your procedure? no  2.   Have you had an respiratory symptoms (SOB or cough) since your procedure? no  3.   Have you tested positive for COVID 19 since your procedure no  4.   Have you had any family members/close contacts diagnosed with the COVID 19 since your procedure?  no   If yes to any of these questions please route to Joylene John, RN and Joella Prince, RN

## 2020-08-26 ENCOUNTER — Other Ambulatory Visit: Payer: Self-pay

## 2020-08-26 ENCOUNTER — Ambulatory Visit: Payer: 59 | Admitting: Internal Medicine

## 2020-10-05 ENCOUNTER — Ambulatory Visit (INDEPENDENT_AMBULATORY_CARE_PROVIDER_SITE_OTHER): Payer: 59 | Admitting: Medical

## 2020-10-05 ENCOUNTER — Encounter: Payer: Self-pay | Admitting: Medical

## 2020-10-05 ENCOUNTER — Other Ambulatory Visit: Payer: Self-pay

## 2020-10-05 VITALS — BP 120/86 | HR 86 | Ht 69.5 in | Wt 264.6 lb

## 2020-10-05 DIAGNOSIS — D500 Iron deficiency anemia secondary to blood loss (chronic): Secondary | ICD-10-CM

## 2020-10-05 DIAGNOSIS — N946 Dysmenorrhea, unspecified: Secondary | ICD-10-CM

## 2020-10-05 DIAGNOSIS — D259 Leiomyoma of uterus, unspecified: Secondary | ICD-10-CM

## 2020-10-05 DIAGNOSIS — Z23 Encounter for immunization: Secondary | ICD-10-CM

## 2020-10-05 DIAGNOSIS — J4550 Severe persistent asthma, uncomplicated: Secondary | ICD-10-CM

## 2020-10-05 DIAGNOSIS — Z6838 Body mass index (BMI) 38.0-38.9, adult: Secondary | ICD-10-CM

## 2020-10-05 DIAGNOSIS — K219 Gastro-esophageal reflux disease without esophagitis: Secondary | ICD-10-CM

## 2020-10-05 DIAGNOSIS — E669 Obesity, unspecified: Secondary | ICD-10-CM | POA: Insufficient documentation

## 2020-10-05 HISTORY — DX: Leiomyoma of uterus, unspecified: D25.9

## 2020-10-05 LAB — POCT URINALYSIS DIP (PROADVANTAGE DEVICE)
Bilirubin, UA: NEGATIVE
Blood, UA: NEGATIVE
Glucose, UA: NEGATIVE mg/dL
Ketones, POC UA: NEGATIVE mg/dL
Leukocytes, UA: NEGATIVE
Nitrite, UA: NEGATIVE
Protein Ur, POC: NEGATIVE mg/dL
Specific Gravity, Urine: 1.02
Urobilinogen, Ur: 0.2
pH, UA: 6 (ref 5.0–8.0)

## 2020-10-05 LAB — CBC
Hematocrit: 36.4 % (ref 34.0–46.6)
Hemoglobin: 11.1 g/dL (ref 11.1–15.9)
MCH: 23.3 pg — ABNORMAL LOW (ref 26.6–33.0)
MCHC: 30.5 g/dL — ABNORMAL LOW (ref 31.5–35.7)
MCV: 76 fL — ABNORMAL LOW (ref 79–97)
Platelets: 208 10*3/uL (ref 150–450)
RBC: 4.77 x10E6/uL (ref 3.77–5.28)
RDW: 13.4 % (ref 11.7–15.4)
WBC: 6.4 10*3/uL (ref 3.4–10.8)

## 2020-10-05 MED ORDER — SEMAGLUTIDE-WEIGHT MANAGEMENT 0.25 MG/0.5ML ~~LOC~~ SOAJ: 0.2500 mg | SUBCUTANEOUS | 1 refills | Status: DC

## 2020-10-05 MED ORDER — ALBUTEROL SULFATE HFA 108 (90 BASE) MCG/ACT IN AERS: 2.0000 | INHALATION_SPRAY | RESPIRATORY_TRACT | 1 refills | Status: DC | PRN

## 2020-10-05 MED ORDER — BD PEN NEEDLE NANO U/F 32G X 4 MM MISC: 1.0000 | Freq: Every day | 2 refills | Status: DC

## 2020-10-05 MED ORDER — PANTOPRAZOLE SODIUM 40 MG PO TBEC: 40.0000 mg | DELAYED_RELEASE_TABLET | Freq: Every day | ORAL | 3 refills | Status: DC

## 2020-10-05 MED ORDER — TRELEGY ELLIPTA 200-62.5-25 MCG/INH IN AEPB: 1.0000 | INHALATION_SPRAY | Freq: Every day | RESPIRATORY_TRACT | 1 refills | Status: DC

## 2020-10-05 MED ORDER — SERTRALINE HCL 25 MG PO TABS: 25.0000 mg | ORAL_TABLET | Freq: Every day | ORAL | 1 refills | Status: DC

## 2020-10-05 NOTE — Progress Notes (Signed)
Referral has been sent.

## 2020-10-05 NOTE — Progress Notes (Signed)
Subjective: Chief Complaint  Patient presents with  . Dysmenorrhea    having long periods   Here for vaginal bleeding.  Started with bleeding ongoing since October 3rd.  Saw gynecology in 2019 for similar bleeding.  Was on hormones at that time.  She had an endometrial biopsy and ultrasound a few years ago.  No concern for STD.  Has one partner x 10 years.  She does have a history of small fibroids.  The bleeding is quite heavy, leaking through on the bed sheets   Bleeding daily for a month and a half  Needs refills on medications including albuterol, Protonix  GERD-she avoids acidic and spicy foods and other GERD triggers.  Takes her reflux medicine daily  Asthma-she takes Symbicort every day but still is having to use albuterol daily.  She can have worse periods throughout the year especially in spring and fall  She would like help to lose weight.  She has recently changed to a vegan diet over a month ago and so far not losing weight.  She is exercising but her asthma limits her exercise.  No prior referral to weight management clinic.  She has contemplated bariatric surgery in the past.  No prior medicine to help with weight loss  No other aggravating or relieving factors.    No other c/o.  Past Medical History:  Diagnosis Date  . Abdominal pain   . Anemia   . Asthma   . Depression   . Diarrhea   . Heart murmur   . Obesity   . Plantar fasciitis   . Shortness of breath   . Vomiting      The following portions of the patient's history were reviewed and updated as appropriate: allergies, current medications, past family history, past medical history, past social history, past surgical history and problem list.  ROS Otherwise as in subjective above  Objective: BP 120/86   Pulse 86   Ht 5' 9.5" (1.765 m)   Wt 264 lb 9.6 oz (120 kg)   SpO2 96%   BMI 38.51 kg/m   General appearance: alert, no distress, well developed, well nourished Neck: supple, no lymphadenopathy, no  thyromegaly, no masses Heart: RRR, normal S1, S2, no murmurs Lungs: CTA bilaterally, no wheezes, rhonchi, or rales Abdomen: +bs, soft, generalized tenderness in the lower abdomen, otherwise non tender, non distended, no masses, no hepatomegaly, no splenomegaly Pulses: 2+ radial pulses, 2+ pedal pulses, normal cap refill Ext: no edema   Assessment: Encounter Diagnoses  Name Primary?  . Dysmenorrhea Yes  . Uterine leiomyoma, unspecified location   . Need for influenza vaccination   . Iron deficiency anemia due to chronic blood loss   . BMI 38.0-38.9,adult   . Severe persistent asthma, unspecified whether complicated   . Gastroesophageal reflux disease, unspecified whether esophagitis present      Plan: Dysmenorrhea with heavy bleeding, anemia, prior similar in 2019 -referral to gynecology for possible treatment options.  She declines STD screen today.  She does have a history of fibroids on prior ultrasound done 3 years ago.  I reviewed the prior ultrasound and endometrial biopsy which is benign back in 2018  Counseled on the influenza virus vaccine.  Vaccine information sheet given.  Influenza vaccine given after consent obtained.  Iron deficiency anemia-she continues on tandem since earlier this year, recheck CBC today  Asthma severe not controlled on Symbicort-begin trial of Trelegy, continue albuterol.  Discussed risk and benefits and proper use of medication.  Avoid allergen  triggers.  We discussed other factors such as GERD and obesity.  GERD-continue PPI for now.  She already avoids acidic foods and trigger foods  Obesity-discussed the need to lose weigh.  she recently changed to a vegan diet.  Continue efforts at regular exercise.  We discussed medication options, referral options.  She wants to begin a trial of Wegovy after discussing medication options.  Discussed proper use of medicine, discussed the taper up on the medication as we move forward.  Recheck in 1 month  Refill  medicines at her request today  [name] was seen today for dysmenorrhea.  Diagnoses and all orders for this visit:  Dysmenorrhea -     CBC -     Ambulatory referral to Gynecology  Uterine leiomyoma, unspecified location -     Ambulatory referral to Gynecology  Need for influenza vaccination  Iron deficiency anemia due to chronic blood loss -     CBC -     Ambulatory referral to Gynecology  BMI 38.0-38.9,adult  Severe persistent asthma, unspecified whether complicated  Gastroesophageal reflux disease, unspecified whether esophagitis present  Other orders -     Flu Vaccine QUAD 6+ mos PF IM (Fluarix Quad PF) -     sertraline (ZOLOFT) 25 MG tablet; Take 1 tablet (25 mg total) by mouth daily. -     pantoprazole (PROTONIX) 40 MG tablet; Take 1 tablet (40 mg total) by mouth daily. -     albuterol (VENTOLIN HFA) 108 (90 Base) MCG/ACT inhaler; Inhale 2 puffs into the lungs every 4 (four) hours as needed for wheezing. -     Semaglutide-Weight Management 0.25 MG/0.5ML SOAJ; Inject 0.25 mg into the skin once a week. -     Fluticasone-Umeclidin-Vilant (TRELEGY ELLIPTA) 200-62.5-25 MCG/INH AEPB; Inhale 1 puff into the lungs daily.   Follow up: pending lab, referral

## 2020-10-05 NOTE — Addendum Note (Signed)
Addended by: Carlena Hurl on: 10/05/2020 12:45 PM   Modules accepted: Orders

## 2020-10-05 NOTE — Addendum Note (Signed)
Addended by: Edgar Frisk on: 10/05/2020 02:03 PM   Modules accepted: Orders

## 2020-10-07 ENCOUNTER — Telehealth: Payer: Self-pay

## 2020-10-07 NOTE — Telephone Encounter (Signed)
P.A. WEGOVY 

## 2020-11-05 ENCOUNTER — Encounter (HOSPITAL_COMMUNITY): Payer: Self-pay | Admitting: Emergency Medicine

## 2020-11-05 ENCOUNTER — Emergency Department (HOSPITAL_COMMUNITY): Payer: Self-pay

## 2020-11-05 ENCOUNTER — Other Ambulatory Visit: Payer: Self-pay

## 2020-11-05 ENCOUNTER — Emergency Department (HOSPITAL_COMMUNITY)
Admission: EM | Admit: 2020-11-05 | Discharge: 2020-11-05 | Disposition: A | Discharge status: Home / Self Care | Payer: Self-pay | Attending: Emergency Medicine | Admitting: Emergency Medicine

## 2020-11-05 DIAGNOSIS — Z20822 Contact with and (suspected) exposure to covid-19: Secondary | ICD-10-CM | POA: Insufficient documentation

## 2020-11-05 DIAGNOSIS — J45901 Unspecified asthma with (acute) exacerbation: Secondary | ICD-10-CM | POA: Insufficient documentation

## 2020-11-05 DIAGNOSIS — Z7951 Long term (current) use of inhaled steroids: Secondary | ICD-10-CM | POA: Insufficient documentation

## 2020-11-05 LAB — RESP PANEL BY RT-PCR (FLU A&B, COVID) ARPGX2
Influenza A by PCR: NEGATIVE
Influenza B by PCR: NEGATIVE
SARS Coronavirus 2 by RT PCR: NEGATIVE

## 2020-11-05 LAB — POC SARS CORONAVIRUS 2 AG -  ED: SARS Coronavirus 2 Ag: NEGATIVE

## 2020-11-05 MED ORDER — DEXAMETHASONE SODIUM PHOSPHATE 10 MG/ML IJ SOLN
10.0000 mg | Freq: Once | INTRAMUSCULAR | Status: AC
  Administered 2020-11-05: 10 mg via INTRAMUSCULAR
  Filled 2020-11-05: qty 1

## 2020-11-05 MED ORDER — IPRATROPIUM-ALBUTEROL 0.5-2.5 (3) MG/3ML IN SOLN
3.0000 mL | Freq: Once | RESPIRATORY_TRACT | Status: AC
  Administered 2020-11-05: 3 mL via RESPIRATORY_TRACT
  Filled 2020-11-05: qty 3

## 2020-11-05 MED ORDER — MAGNESIUM SULFATE 2 GM/50ML IV SOLN
2.0000 g | Freq: Once | INTRAVENOUS | Status: DC
  Filled 2020-11-05: qty 50

## 2020-11-05 MED ORDER — PREDNISONE 10 MG PO TABS: 50.0000 mg | ORAL_TABLET | Freq: Every day | ORAL | 0 refills | Status: DC

## 2020-11-05 MED ORDER — ALBUTEROL SULFATE HFA 108 (90 BASE) MCG/ACT IN AERS
2.0000 | INHALATION_SPRAY | RESPIRATORY_TRACT | Status: AC
Start: 2020-11-05 — End: 2020-11-05
  Administered 2020-11-05: 2 via RESPIRATORY_TRACT
  Filled 2020-11-05: qty 6.7

## 2020-11-05 MED ORDER — SODIUM CHLORIDE 0.9 % IV SOLN: INTRAVENOUS | Status: DC

## 2020-11-05 MED ORDER — ALBUTEROL (5 MG/ML) CONTINUOUS INHALATION SOLN: 10.0000 mg/h | INHALATION_SOLUTION | Freq: Once | RESPIRATORY_TRACT | Status: DC

## 2020-11-05 MED ORDER — ALBUTEROL SULFATE HFA 108 (90 BASE) MCG/ACT IN AERS: 2.0000 | INHALATION_SPRAY | RESPIRATORY_TRACT | 0 refills | Status: DC | PRN

## 2020-11-05 MED ORDER — METHYLPREDNISOLONE SODIUM SUCC 125 MG IJ SOLR
125.0000 mg | Freq: Once | INTRAMUSCULAR | Status: DC
  Filled 2020-11-05: qty 2

## 2020-11-05 NOTE — ED Triage Notes (Addendum)
Pt reports asthma flare-up since last night.  Last used inhaler 20 min ago.  Audible wheezing.  Speaking in short phrases. Non-productive cough.

## 2020-11-05 NOTE — Discharge Instructions (Addendum)
We saw you in the ER for your asthma related complains. We gave you some breathing treatments in the ER, and seems like your symptoms have improved. Please take albuterol as needed every 4 hours. Please take the medications prescribed. Please refrain from smoking or smoke exposure. Please see a primary care doctor in 1 week. Return to the ER if your symptoms worsen.  

## 2020-11-05 NOTE — ED Provider Notes (Signed)
[facility] EMERGENCY DEPARTMENT Provider Note   CSN: [id number] Arrival date & time: 11/05/20  0737     History Chief Complaint  Patient presents with  . Asthma    [name] is a 45 y.o. female.  HPI     45 year old comes in a chief complaint of shortness of breath.  She has history of asthma.  She reports that she started getting short of breath last night.  She has run out of her medications.  Her symptoms have progressed she could not sleep at night.  She is having difficulty in breathing even at rest with chest tightness and wheezing.  She has a cough but denies any COVID-19 exposures and is vaccinated against COVID-19.  Past Medical History:  Diagnosis Date  . Abdominal pain   . Anemia   . Asthma   . Depression   . Diarrhea   . Heart murmur   . Obesity   . Plantar fasciitis   . Shortness of breath   . Vomiting     Patient Active Problem List   Diagnosis Date Noted  . Iron deficiency anemia due to chronic blood loss 10/05/2020  . Dysmenorrhea 10/05/2020  . Uterine leiomyoma 10/05/2020  . Need for influenza vaccination 10/05/2020  . BMI 38.0-38.9,adult 10/05/2020  . Severe persistent asthma 10/05/2020  . Gastroesophageal reflux disease 10/05/2020  . Encounter for health maintenance examination in adult 05/09/2020  . Encounter for screening mammogram for malignant neoplasm of breast 05/09/2020  . Screen for colon cancer 05/09/2020  . Vaccine counseling 05/09/2020  . Family history of DVT 03/21/2020  . Plantar fasciitis 12/28/2019  . Menorrhagia with regular cycle 01/07/2015  . Chronic asthma 07/31/2013  . Anemia 01/01/2012    Past Surgical History:  Procedure Laterality Date  . CHOLECYSTECTOMY  06/19/11  . IR RADIOLOGY PERIPHERAL GUIDED IV START  10/23/2018  . IR US GUIDE VASC ACCESS RIGHT  10/23/2018  . TUBAL LIGATION       OB History    Gravida  5   Para  4   Term  3   Preterm  1   AB  1   Living  4     SAB   1   IAB      Ectopic      Multiple      Live Births  4           Family History  Problem Relation Age of Onset  . Diabetes Mother 75  . Hypertension Mother   . Other Mother        sepsis  . Emphysema Father 77       smoked  . Heart disease Father 80       MI  . Emphysema Maternal Aunt        smoked  . Esophageal cancer Maternal Aunt   . Lung cancer Maternal Aunt        smoked  . Heart disease Maternal Grandfather   . Heart disease Maternal Aunt   . Heart disease Sister 32       MI  . Cancer Brother   . Cancer Maternal Uncle   . Cancer Maternal Grandmother   . Heart disease Sister   . Aneurysm Brother   . Colon cancer Neg Hx   . Colon polyps Neg Hx   . Rectal cancer Neg Hx   . Stomach cancer Neg Hx     Social History   Tobacco Use  .  Smoking status: Never Smoker  . Smokeless tobacco: Never Used  Vaping Use  . Vaping Use: Never used  Substance Use Topics  . Alcohol use: No  . Drug use: No    Home Medications Prior to Admission medications   Medication Sig Start Date End Date Taking? Authorizing Provider  albuterol (PROVENTIL) (2.5 MG/3ML) 0.083% nebulizer solution Take 3 mLs (2.5 mg total) by nebulization every 6 (six) hours as needed for wheezing or shortness of breath. 03/22/20   Tysinger, Camelia Eng, PA-C  albuterol (VENTOLIN HFA) 108 (90 Base) MCG/ACT inhaler Inhale 2 puffs into the lungs every 4 (four) hours as needed for wheezing. 10/05/20   Tysinger, Camelia Eng, PA-C  budesonide-formoterol (SYMBICORT) 160-4.5 MCG/ACT inhaler Inhale 2 puffs into the lungs 2 (two) times daily. 05/10/20   Tysinger, Camelia Eng, PA-C  ferrous fumarate-iron polysaccharide complex (TANDEM) 162-115.2 MG CAPS capsule Take 1 capsule by mouth daily with breakfast. 03/22/20   Tysinger, Camelia Eng, PA-C  Fluticasone-Umeclidin-Vilant (TRELEGY ELLIPTA) 200-62.5-25 MCG/INH AEPB Inhale 1 puff into the lungs daily. 10/05/20   Tysinger, Camelia Eng, PA-C  Insulin Pen Needle (BD PEN NEEDLE NANO U/F) 32G  X 4 MM MISC 1 each by Does not apply route at bedtime. 10/05/20   Tysinger, Camelia Eng, PA-C  montelukast (SINGULAIR) 10 MG tablet Take 1 tablet (10 mg total) by mouth daily. 05/10/20 05/10/21  Tysinger, Camelia Eng, PA-C  Na Sulfate-K Sulfate-Mg Sulf 17.5-3.13-1.6 GM/177ML SOLN Suprep (no substitutions)-TAKE AS DIRECTED. 07/27/20   Armbruster, Carlota Raspberry, MD  pantoprazole (PROTONIX) 40 MG tablet Take 1 tablet (40 mg total) by mouth daily. 10/05/20   Tysinger, Camelia Eng, PA-C  Semaglutide-Weight Management 0.25 MG/0.5ML SOAJ Inject 0.25 mg into the skin once a week. 10/05/20   Tysinger, Camelia Eng, PA-C  sertraline (ZOLOFT) 25 MG tablet Take 1 tablet (25 mg total) by mouth daily. 10/05/20   Tysinger, Camelia Eng, PA-C    Allergies    Patient has no known allergies.  Review of Systems   Review of Systems  Constitutional: Positive for activity change. Negative for fever.  Respiratory: Positive for cough, chest tightness, shortness of breath and wheezing.   Gastrointestinal: Negative for vomiting.  Musculoskeletal: Negative for myalgias.  Allergic/Immunologic: Negative for immunocompromised state.    Physical Exam Updated Vital Signs BP 125/77   Pulse 72   Temp 98.1 F (36.7 C) (Oral)   Resp (!) 21   LMP 11/04/2020   SpO2 99%   Physical Exam Vitals and nursing note reviewed.  Constitutional:      Appearance: She is well-developed.  HENT:     Head: Normocephalic and atraumatic.  Eyes:     Extraocular Movements: EOM normal.  Cardiovascular:     Rate and Rhythm: Normal rate.  Pulmonary:     Effort: Respiratory distress present.     Breath sounds: Wheezing present.  Abdominal:     General: Bowel sounds are normal.  Musculoskeletal:     Cervical back: Normal range of motion and neck supple.  Skin:    General: Skin is warm and dry.  Neurological:     Mental Status: She is alert and oriented to person, place, and time.     ED Results / Procedures / Treatments   Labs (all labs ordered are listed,  but only abnormal results are displayed) Labs Reviewed  RESP PANEL BY RT-PCR (FLU A&B, COVID) ARPGX2  POC SARS CORONAVIRUS 2 AG -  ED    EKG EKG Interpretation  Date/Time:  Saturday November 05 2020 08:17:46 EST Ventricular Rate:  73 PR Interval:    QRS Duration: 95 QT Interval:  424 QTC Calculation: 461 R Axis:   48 Text Interpretation: Sinus rhythm Low voltage, precordial leads No acute changes No significant change since last tracing Confirmed by Varney Biles ([id number]) on 11/05/2020 8:51:30 AM   Radiology DG Chest Port 1 View  Result Date: 11/05/2020 CLINICAL DATA:  45 year old female with wheezing, asthma flare up. EXAM: PORTABLE CHEST 1 VIEW COMPARISON:  12/28/2019 FINDINGS: The patient is rotated to the right. Low lung volumes. The heart size and mediastinal contours are within normal limits. Increased prominence of pulmonary vasculature. Possible bibasilar peribronchovascular airspace opacities. No pleural effusion or pneumothorax. No acute osseous abnormality\. IMPRESSION: Increased prominence of the pulmonary vasculature and peribronchovascular airspace opacities are noted, likely secondary to poor inspiratory effort and low lung volumes. Recommend short-term follow-up radiograph if symptoms persist or worsen. Electronically Signed   By: Ruthann Cancer MD   On: 11/05/2020 08:47    Procedures Procedures (including critical care time)  Medications Ordered in ED Medications  albuterol (VENTOLIN HFA) 108 (90 Base) MCG/ACT inhaler 2 puff (2 puffs Inhalation Given 11/05/20 0847)  ipratropium-albuterol (DUONEB) 0.5-2.5 (3) MG/3ML nebulizer solution 3 mL (3 mLs Nebulization Given 11/05/20 0947)  dexamethasone (DECADRON) injection 10 mg (10 mg Intramuscular Given 11/05/20 0945)    ED Course  I have reviewed the triage vital signs and the nursing notes.  Pertinent labs & imaging results that were available during my care of the patient were reviewed by me and considered in my medical  decision making (see chart for details).  Clinical Course as of 11/05/20 0955  Sat Nov 05, 2020  6270 Repeat exam reveals significant clearing of wheezing in all lung fields. Patient is not in any respiratory distress nor is there hypoxia. She is feeling a lot better. The IV still not established.  I do not think patient will need admission therefore I am canceling the labs and changing her IV Solu-Medrol to IM Decadron.  Patient will be getting a DuoNeb nebulizer treatment here.  Rapid COVID-19 is negative.  Anticipate discharge.  [AN]    Clinical Course User Index [AN] Varney Biles, MD   MDM Rules/Calculators/A&P                          45 year old female with history of asthma comes in with chief complaint of shortness of breath.  She is having audible wheezing.  Symptoms started last night and got worse acutely.  Patient has not taken any treatment prior to ED arrival.  She is in mild respiratory distress with tachypnea, poor aeration on exam. Differential diagnosis is essentially asthma exacerbation with attempt to rule out interstitial edema and COVID-19.  Appropriate labs and imaging ordered.  Final Clinical Impression(s) / ED Diagnoses Final diagnoses:  Moderate asthma with exacerbation, unspecified whether persistent    Rx / DC Orders ED Discharge Orders    None       Varney Biles, MD 11/05/20 [phone]

## 2020-11-09 ENCOUNTER — Encounter: Payer: Self-pay | Admitting: Obstetrics & Gynecology

## 2020-11-09 ENCOUNTER — Other Ambulatory Visit: Payer: Self-pay

## 2020-11-16 ENCOUNTER — Encounter: Payer: Self-pay | Admitting: Obstetrics & Gynecology

## 2020-11-22 ENCOUNTER — Telehealth: Payer: Self-pay

## 2020-11-22 NOTE — Telephone Encounter (Signed)
Called pt and she has [place], called pharmacy & no longer covered by [facility] pharmacy benefits.  Called pt and she said insurance still same.  [place] and ins is same pharmacy benefits have changed held for 45 minutes and then was disconnected.  Pt is supposed to email her insurance card

## 2020-12-22 ENCOUNTER — Telehealth: Payer: Self-pay

## 2020-12-22 NOTE — Telephone Encounter (Signed)
Pt never sent insurance card

## 2020-12-22 NOTE — Telephone Encounter (Signed)
Called [facility] [phone], P.A. handled thru [employer] T# [phone] [facility] plan exclusion so I completed P.A. over the phone

## 2020-12-22 NOTE — Telephone Encounter (Signed)
Pt never sent insurance card to complete

## 2020-12-26 ENCOUNTER — Encounter: Payer: Self-pay | Admitting: Internal Medicine

## 2020-12-31 NOTE — Telephone Encounter (Signed)
P.A. Mancel Parsons denied, plan exclusion.  ALL weight loss medications are excluded.

## 2021-01-06 NOTE — Telephone Encounter (Signed)
Let her know per your research all weight loss medicines are excluded on her plan  Go ahead and get her back for recheck on weight loss efforts thus far, have her do a 3-day diary of all food and beverages consumed so we can review this, and bring this with her

## 2021-01-10 NOTE — Telephone Encounter (Signed)
Called pt and informed, she just started a new job so doesn't want to schedule an appt at this time but will call back

## 2021-03-15 ENCOUNTER — Encounter: Payer: Self-pay | Admitting: Medical

## 2021-03-15 ENCOUNTER — Ambulatory Visit (INDEPENDENT_AMBULATORY_CARE_PROVIDER_SITE_OTHER): Payer: 59 | Admitting: Medical

## 2021-03-15 ENCOUNTER — Other Ambulatory Visit: Payer: Self-pay

## 2021-03-15 VITALS — BP 120/86 | HR 89 | Ht 69.5 in | Wt 272.6 lb

## 2021-03-15 DIAGNOSIS — M722 Plantar fascial fibromatosis: Secondary | ICD-10-CM

## 2021-03-15 MED ORDER — GABAPENTIN 100 MG PO CAPS: 200.0000 mg | ORAL_CAPSULE | Freq: Two times a day (BID) | ORAL | 1 refills | Status: DC

## 2021-03-15 MED ORDER — NAPROXEN 375 MG PO TBEC: 1.0000 | DELAYED_RELEASE_TABLET | Freq: Two times a day (BID) | ORAL | 0 refills | Status: DC

## 2021-03-15 NOTE — Patient Instructions (Addendum)
Recommendations:  Begin Naprosyn 375mg  , 1 tablet twice daily with food for 1-2 weeks  Begin Gabapentin 100mg .  Start 1 capsule at night for 5 days, then go to 2 capsules at night  After 2 weeks, you can add 100mg  in the morning.  So after 2 weeks you may possibly be on 100mg  morning, 200mg  night.   Lets stay at this dose for now  Lets recheck in 3 weeks  Begin plantar fascitis night splints every night  Don't go barefooted  Use arch supports

## 2021-03-15 NOTE — Progress Notes (Signed)
Subjective:  [name] is a 45 y.o. female who presents for Chief Complaint  Patient presents with  . Foot Pain    Bilateral foot pain started 1 week ago with no known injury      Here for severe foot pain bilaterally.  Pain in bottom of feet, throbbing.  Started last week, rested whole weekend.  Monday, couldn't walk.  no injury, no fall, no trauma.   Has pain like this prior, diagnosed with plantar fasciitis.  She is actually gotten steroid shots in her feet from podiatry in the past about a year ago.    She is also been on gabapentin in the past.  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  No other aggravating or relieving factors.    No other c/o.  The following portions of the patient's history were reviewed and updated as appropriate: allergies, current medications, past family history, past medical history, past social history, past surgical history and problem list.  ROS Otherwise as in subjective above   Objective: BP 120/86   Pulse 89   Ht 5' 9.5" (1.765 m)   Wt 272 lb 9.6 oz (123.7 kg)   BMI 39.68 kg/m   General appearance: alert, no distress, well developed, well nourished MSK: Quite tender throughout bilateral volar feet of the plantar fascia, relatively flat feet, tender in the Achilles as well.  No swelling or other deformity.  She has pain just simply standing on her feet.  No knee pain or hip pain, normal knee and hip range of motion without other leg deformity Legs neurovascularly intact    Assessment: Encounter Diagnosis  Name Primary?  . Plantar fasciitis Yes     Plan: Discussed exam findings, symptoms, and etiology appears to be plantar fascitis and pes planus.  Discussed diagnosis, treatment recommendations, and follow up.     Plantar fascitis  Every morning try doing a tennis ball massage with feet on the floor and towel stretch behind the ball of the foot to stretch the plantar fascia  Order plantar fascitis night time 90  degree splints online, through [employer] for example.   They are typically about $25 each  Avoid going barefoot since your feet flatten out without good arch support  I recommend you use arch support shoe inserts.   This will help support the plantar fascia and arches  Begin the medications below  F/u in 3-4 weeks  [name] was seen today for foot pain.  Diagnoses and all orders for this visit:  Plantar fasciitis  Other orders -     Naproxen 375 MG TBEC; Take 1 tablet (375 mg total) by mouth in the morning and at bedtime. -     gabapentin (NEURONTIN) 100 MG capsule; Take 2 capsules (200 mg total) by mouth in the morning and at bedtime.    Follow up: 3 weeks

## 2021-04-05 ENCOUNTER — Ambulatory Visit: Payer: 59 | Admitting: Medical

## 2021-04-11 ENCOUNTER — Encounter: Payer: Self-pay | Admitting: Medical

## 2021-07-04 ENCOUNTER — Ambulatory Visit (HOSPITAL_COMMUNITY)
Admission: EM | Admit: 2021-07-04 | Discharge: 2021-07-04 | Disposition: A | Discharge status: Home / Self Care | Payer: Self-pay | Attending: Emergency Medicine | Admitting: Emergency Medicine

## 2021-07-04 ENCOUNTER — Other Ambulatory Visit: Payer: Self-pay

## 2021-07-04 ENCOUNTER — Encounter (HOSPITAL_COMMUNITY): Payer: Self-pay | Admitting: Emergency Medicine

## 2021-07-04 DIAGNOSIS — H109 Unspecified conjunctivitis: Secondary | ICD-10-CM

## 2021-07-04 MED ORDER — POLYMYXIN B-TRIMETHOPRIM 10000-0.1 UNIT/ML-% OP SOLN: 1.0000 [drp] | OPHTHALMIC | 0 refills | Status: DC

## 2021-07-04 NOTE — ED Triage Notes (Signed)
Pt c/o left eye hurting and drainage x 2 days. Reports today when woke up was crusted shut. Has sensitivity to light

## 2021-07-04 NOTE — Discharge Instructions (Addendum)
Use 1 drop in left eye every 4 hours while awake for 7 days   If symptoms persist or worsen please follow up with your opthalmologic for evaluation   Pat eye to remove secretions  Do not rub eye, avoid use of contacts and make up until symptoms resolve  May apply cool compress to eye for comfort

## 2021-07-04 NOTE — ED Provider Notes (Signed)
[place]    CSN: [id number] Arrival date & time: 07/04/21  1332      History   Chief Complaint Chief Complaint  Patient presents with   Eye Drainage    HPI [name] is a 45 y.o. female.   Patient presents left eye pain, redness, crusting of discharge, watery and light sensitivity for 2 days. Symptoms causing blurred vision. Wears contact but were in use at time of onset. Has not attempted treatment. Denies floaters, fever, chills.   Past Medical History:  Diagnosis Date   Abdominal pain    Anemia    Asthma    Depression    Diarrhea    Heart murmur    Obesity    Plantar fasciitis    Shortness of breath    Vomiting     Patient Active Problem List   Diagnosis Date Noted   Iron deficiency anemia due to chronic blood loss 10/05/2020   Dysmenorrhea 10/05/2020   Uterine leiomyoma 10/05/2020   Need for influenza vaccination 10/05/2020   BMI 38.0-38.9,adult 10/05/2020   Severe persistent asthma 10/05/2020   Gastroesophageal reflux disease 10/05/2020   Encounter for health maintenance examination in adult 05/09/2020   Encounter for screening mammogram for malignant neoplasm of breast 05/09/2020   Screen for colon cancer 05/09/2020   Vaccine counseling 05/09/2020   Family history of DVT 03/21/2020   Plantar fasciitis 12/28/2019   Menorrhagia with regular cycle 01/07/2015   Chronic asthma 07/31/2013   Anemia 01/01/2012    Past Surgical History:  Procedure Laterality Date   CHOLECYSTECTOMY  06/19/11   IR RADIOLOGY PERIPHERAL GUIDED IV START  10/23/2018   IR US GUIDE VASC ACCESS RIGHT  10/23/2018   TUBAL LIGATION      OB History     Gravida  5   Para  4   Term  3   Preterm  1   AB  1   Living  4      SAB  1   IAB      Ectopic      Multiple      Live Births  4            Home Medications    Prior to Admission medications   Medication Sig Start Date End Date Taking? Authorizing Provider  trimethoprim-polymyxin b  (POLYTRIM) ophthalmic solution Place 1 drop into the left eye every 4 (four) hours. 07/04/21  Yes Dejion Grillo R, NP  albuterol (PROVENTIL) (2.5 MG/3ML) 0.083% nebulizer solution Take 3 mLs (2.5 mg total) by nebulization every 6 (six) hours as needed for wheezing or shortness of breath. 03/22/20   Tysinger, Camelia Eng, PA-C  albuterol (VENTOLIN HFA) 108 (90 Base) MCG/ACT inhaler Inhale 2 puffs into the lungs every 4 (four) hours as needed for wheezing or shortness of breath. 11/05/20   Varney Biles, MD  budesonide-formoterol (SYMBICORT) 160-4.5 MCG/ACT inhaler Inhale 2 puffs into the lungs 2 (two) times daily. Patient not taking: Reported on 03/15/2021 05/10/20   Tysinger, Camelia Eng, PA-C  ferrous fumarate-iron polysaccharide complex (TANDEM) 162-115.2 MG CAPS capsule Take 1 capsule by mouth daily with breakfast. Patient not taking: Reported on 03/15/2021 03/22/20   Tysinger, Camelia Eng, PA-C  Fluticasone-Umeclidin-Vilant (TRELEGY ELLIPTA) 200-62.5-25 MCG/INH AEPB Inhale 1 puff into the lungs daily. 10/05/20   Tysinger, Camelia Eng, PA-C  gabapentin (NEURONTIN) 100 MG capsule Take 2 capsules (200 mg total) by mouth in the morning and at bedtime. 03/15/21   Tysinger, Camelia Eng, PA-C  Insulin Pen Needle (BD PEN NEEDLE NANO U/F) 32G X 4 MM MISC 1 each by Does not apply route at bedtime. 10/05/20   Tysinger, Camelia Eng, PA-C  montelukast (SINGULAIR) 10 MG tablet Take 1 tablet (10 mg total) by mouth daily. 05/10/20 05/10/21  Tysinger, Camelia Eng, PA-C  Na Sulfate-K Sulfate-Mg Sulf 17.5-3.13-1.6 GM/177ML SOLN Suprep (no substitutions)-TAKE AS DIRECTED. 07/27/20   Armbruster, Carlota Raspberry, MD  Naproxen 375 MG TBEC Take 1 tablet (375 mg total) by mouth in the morning and at bedtime. 03/15/21   Tysinger, Camelia Eng, PA-C  pantoprazole (PROTONIX) 40 MG tablet Take 1 tablet (40 mg total) by mouth daily. 10/05/20   Tysinger, Camelia Eng, PA-C  predniSONE (DELTASONE) 10 MG tablet Take 5 tablets (50 mg total) by mouth daily. Patient not taking: Reported on  03/15/2021 11/05/20   Varney Biles, MD  Semaglutide-Weight Management 0.25 MG/0.5ML SOAJ Inject 0.25 mg into the skin once a week. Patient not taking: Reported on 03/15/2021 10/05/20   Tysinger, Camelia Eng, PA-C  sertraline (ZOLOFT) 25 MG tablet Take 1 tablet (25 mg total) by mouth daily. Patient not taking: Reported on 03/15/2021 10/05/20   Tysinger, Camelia Eng, PA-C    Family History Family History  Problem Relation Age of Onset   Diabetes Mother 37   Hypertension Mother    Other Mother        sepsis   Emphysema Father 21       smoked   Heart disease Father 46       MI   Emphysema Maternal Aunt        smoked   Esophageal cancer Maternal Aunt    Lung cancer Maternal Aunt        smoked   Heart disease Maternal Grandfather    Heart disease Maternal Aunt    Heart disease Sister 87       MI   Cancer Brother    Cancer Maternal Uncle    Cancer Maternal Grandmother    Heart disease Sister    Aneurysm Brother    Colon cancer Neg Hx    Colon polyps Neg Hx    Rectal cancer Neg Hx    Stomach cancer Neg Hx     Social History Social History   Tobacco Use   Smoking status: Never   Smokeless tobacco: Never  Vaping Use   Vaping Use: Never used  Substance Use Topics   Alcohol use: No   Drug use: No     Allergies   Patient has no known allergies.   Review of Systems Review of Systems  Constitutional: Negative.   HENT: Negative.    Eyes:  Positive for photophobia, pain, discharge and redness. Negative for itching and visual disturbance.  Skin: Negative.     Physical Exam Triage Vital Signs ED Triage Vitals  Enc Vitals Group     BP 07/04/21 1417 (!) 125/58     Pulse Rate 07/04/21 1417 97     Resp 07/04/21 1417 17     Temp 07/04/21 1417 98.4 F (36.9 C)     Temp Source 07/04/21 1417 Oral     SpO2 07/04/21 1417 100 %     Weight --      Height --      Head Circumference --      Peak Flow --      Pain Score 07/04/21 1415 7     Pain Loc --      Pain Edu? --  Excl.  in [place]? --    No data found.  Updated Vital Signs BP (!) 125/58 (BP Location: Right Arm)   Pulse 97   Temp 98.4 F (36.9 C) (Oral)   Resp 17   LMP 06/20/2021   SpO2 100%   Visual Acuity Right Eye Distance:   Left Eye Distance:   Bilateral Distance:    Right Eye Near:   Left Eye Near:    Bilateral Near:     Physical Exam Constitutional:      Appearance: Normal appearance. She is normal weight.  HENT:     Head: Normocephalic.  Eyes:     Extraocular Movements: Extraocular movements intact.     Comments: Erythema of left conjunctivae, no tenderness, discharge presents   Pulmonary:     Effort: Pulmonary effort is normal.  Skin:    General: Skin is warm and dry.  Neurological:     Mental Status: She is alert and oriented to person, place, and time. Mental status is at baseline.  Psychiatric:        Mood and Affect: Mood normal.        Behavior: Behavior normal.     UC Treatments / Results  Labs (all labs ordered are listed, but only abnormal results are displayed) Labs Reviewed - No data to display  EKG   Radiology No results found.  Procedures Procedures (including critical care time)  Medications Ordered in UC Medications - No data to display  Initial Impression / Assessment and Plan / UC Course  I have reviewed the triage vital signs and the nursing notes.  Pertinent labs & imaging results that were available during my care of the patient were reviewed by me and considered in my medical decision making (see chart for details).  Bacterial conjunctivitis of left eye  Polytrim 1 drop left eye every 4 hours for 7 days Follow up with opthalmology for persistent worsening symptoms  Advised to avoid rubbing eye, make up and use of contacts Cool compresses for comfort  Final Clinical Impressions(s) / UC Diagnoses   Final diagnoses:  Bacterial conjunctivitis of left eye     Discharge Instructions      Use 1 drop in left eye every 4 hours while awake  for 7 days   If symptoms persist or worsen please follow up with your opthalmologic for evaluation   Pat eye to remove secretions  Do not rub eye, avoid use of contacts and make up until symptoms resolve  May apply cool compress to eye for comfort    ED Prescriptions     Medication Sig Dispense Auth. Provider   trimethoprim-polymyxin b (POLYTRIM) ophthalmic solution Place 1 drop into the left eye every 4 (four) hours. 10 mL Hans Eden, NP      PDMP not reviewed this encounter.   Hans Eden, NP 07/04/21 1549

## 2021-08-24 ENCOUNTER — Encounter (HOSPITAL_COMMUNITY): Payer: Self-pay | Admitting: Emergency Medicine

## 2021-08-24 ENCOUNTER — Ambulatory Visit (HOSPITAL_COMMUNITY)
Admission: EM | Admit: 2021-08-24 | Discharge: 2021-08-24 | Disposition: A | Discharge status: Home / Self Care | Payer: Self-pay | Attending: Family Medicine | Admitting: Family Medicine

## 2021-08-24 ENCOUNTER — Other Ambulatory Visit: Payer: Self-pay

## 2021-08-24 ENCOUNTER — Ambulatory Visit (INDEPENDENT_AMBULATORY_CARE_PROVIDER_SITE_OTHER): Payer: Self-pay

## 2021-08-24 DIAGNOSIS — S93402A Sprain of unspecified ligament of left ankle, initial encounter: Secondary | ICD-10-CM

## 2021-08-24 DIAGNOSIS — M25572 Pain in left ankle and joints of left foot: Secondary | ICD-10-CM

## 2021-08-24 DIAGNOSIS — M545 Low back pain, unspecified: Secondary | ICD-10-CM

## 2021-08-24 DIAGNOSIS — M25511 Pain in right shoulder: Secondary | ICD-10-CM

## 2021-08-24 DIAGNOSIS — W19XXXA Unspecified fall, initial encounter: Secondary | ICD-10-CM

## 2021-08-24 MED ORDER — NAPROXEN 500 MG PO TABS: 500.0000 mg | ORAL_TABLET | Freq: Two times a day (BID) | ORAL | 0 refills | Status: DC

## 2021-08-24 MED ORDER — CYCLOBENZAPRINE HCL 10 MG PO TABS: 10.0000 mg | ORAL_TABLET | Freq: Three times a day (TID) | ORAL | 0 refills | Status: DC | PRN

## 2021-08-24 NOTE — ED Provider Notes (Signed)
[place]    CSN: [id number] Arrival date & time: 08/24/21  3532      History   Chief Complaint Chief Complaint  Patient presents with   Back Pain   Ankle Pain   Neck Pain   Fall    HPI [name] is a 45 y.o. female.   Patient presenting today with right-sided neck and shoulder pain, right low back pain, left ankle pain and swelling after a fall where she slipped on some food on the ground yesterday.  She states she landed on her right hip and buttock when she fell.  She thinks that she twisted her leg underneath herself when she fell.  She states the ankle is much less swollen and able to bear weight on it today but wanted to get it checked out anyways.  Has taken over-the-counter pain relievers with minimal temporary relief of symptoms.  Denies numbness, tingling, weakness, radiation of pain down legs, bowel or bladder incontinence.   Past Medical History:  Diagnosis Date   Abdominal pain    Anemia    Asthma    Depression    Diarrhea    Heart murmur    Obesity    Plantar fasciitis    Shortness of breath    Vomiting     Patient Active Problem List   Diagnosis Date Noted   Iron deficiency anemia due to chronic blood loss 10/05/2020   Dysmenorrhea 10/05/2020   Uterine leiomyoma 10/05/2020   Need for influenza vaccination 10/05/2020   BMI 38.0-38.9,adult 10/05/2020   Severe persistent asthma 10/05/2020   Gastroesophageal reflux disease 10/05/2020   Encounter for health maintenance examination in adult 05/09/2020   Encounter for screening mammogram for malignant neoplasm of breast 05/09/2020   Screen for colon cancer 05/09/2020   Vaccine counseling 05/09/2020   Family history of DVT 03/21/2020   Plantar fasciitis 12/28/2019   Menorrhagia with regular cycle 01/07/2015   Chronic asthma 07/31/2013   Anemia 01/01/2012    Past Surgical History:  Procedure Laterality Date   CHOLECYSTECTOMY  06/19/11   IR RADIOLOGY PERIPHERAL GUIDED IV START   10/23/2018   IR US GUIDE VASC ACCESS RIGHT  10/23/2018   TUBAL LIGATION      OB History     Gravida  5   Para  4   Term  3   Preterm  1   AB  1   Living  4      SAB  1   IAB      Ectopic      Multiple      Live Births  4            Home Medications    Prior to Admission medications   Medication Sig Start Date End Date Taking? Authorizing Provider  cyclobenzaprine (FLEXERIL) 10 MG tablet Take 1 tablet (10 mg total) by mouth 3 (three) times daily as needed for muscle spasms. Do not drink alcohol or drive while taking this medication.  May cause drowsiness. 08/24/21  Yes Volney American, PA-C  naproxen (NAPROSYN) 500 MG tablet Take 1 tablet (500 mg total) by mouth 2 (two) times daily. 08/24/21  Yes Volney American, PA-C  albuterol (PROVENTIL) (2.5 MG/3ML) 0.083% nebulizer solution Take 3 mLs (2.5 mg total) by nebulization every 6 (six) hours as needed for wheezing or shortness of breath. 03/22/20   Tysinger, Camelia Eng, PA-C  albuterol (VENTOLIN HFA) 108 (90 Base) MCG/ACT inhaler Inhale 2 puffs into the  lungs every 4 (four) hours as needed for wheezing or shortness of breath. 11/05/20   Varney Biles, MD  budesonide-formoterol (SYMBICORT) 160-4.5 MCG/ACT inhaler Inhale 2 puffs into the lungs 2 (two) times daily. Patient not taking: Reported on 03/15/2021 05/10/20   Tysinger, Camelia Eng, PA-C  ferrous fumarate-iron polysaccharide complex (TANDEM) 162-115.2 MG CAPS capsule Take 1 capsule by mouth daily with breakfast. Patient not taking: Reported on 03/15/2021 03/22/20   Tysinger, Camelia Eng, PA-C  Fluticasone-Umeclidin-Vilant (TRELEGY ELLIPTA) 200-62.5-25 MCG/INH AEPB Inhale 1 puff into the lungs daily. 10/05/20   Tysinger, Camelia Eng, PA-C  gabapentin (NEURONTIN) 100 MG capsule Take 2 capsules (200 mg total) by mouth in the morning and at bedtime. 03/15/21   Tysinger, Camelia Eng, PA-C  Insulin Pen Needle (BD PEN NEEDLE NANO U/F) 32G X 4 MM MISC 1 each by Does not apply route  at bedtime. 10/05/20   Tysinger, Camelia Eng, PA-C  montelukast (SINGULAIR) 10 MG tablet Take 1 tablet (10 mg total) by mouth daily. 05/10/20 05/10/21  Tysinger, Camelia Eng, PA-C  Na Sulfate-K Sulfate-Mg Sulf 17.5-3.13-1.6 GM/177ML SOLN Suprep (no substitutions)-TAKE AS DIRECTED. 07/27/20   Armbruster, Carlota Raspberry, MD  Naproxen 375 MG TBEC Take 1 tablet (375 mg total) by mouth in the morning and at bedtime. 03/15/21   Tysinger, Camelia Eng, PA-C  pantoprazole (PROTONIX) 40 MG tablet Take 1 tablet (40 mg total) by mouth daily. 10/05/20   Tysinger, Camelia Eng, PA-C  predniSONE (DELTASONE) 10 MG tablet Take 5 tablets (50 mg total) by mouth daily. Patient not taking: Reported on 03/15/2021 11/05/20   Varney Biles, MD  Semaglutide-Weight Management 0.25 MG/0.5ML SOAJ Inject 0.25 mg into the skin once a week. Patient not taking: Reported on 03/15/2021 10/05/20   Tysinger, Camelia Eng, PA-C  sertraline (ZOLOFT) 25 MG tablet Take 1 tablet (25 mg total) by mouth daily. Patient not taking: Reported on 03/15/2021 10/05/20   Tysinger, Camelia Eng, PA-C  trimethoprim-polymyxin b (POLYTRIM) ophthalmic solution Place 1 drop into the left eye every 4 (four) hours. 07/04/21   Hans Eden, NP    Family History Family History  Problem Relation Age of Onset   Diabetes Mother 75   Hypertension Mother    Other Mother        sepsis   Emphysema Father 20       smoked   Heart disease Father 66       MI   Emphysema Maternal Aunt        smoked   Esophageal cancer Maternal Aunt    Lung cancer Maternal Aunt        smoked   Heart disease Maternal Grandfather    Heart disease Maternal Aunt    Heart disease Sister 35       MI   Cancer Brother    Cancer Maternal Uncle    Cancer Maternal Grandmother    Heart disease Sister    Aneurysm Brother    Colon cancer Neg Hx    Colon polyps Neg Hx    Rectal cancer Neg Hx    Stomach cancer Neg Hx     Social History Social History   Tobacco Use   Smoking status: Never   Smokeless tobacco:  Never  Vaping Use   Vaping Use: Never used  Substance Use Topics   Alcohol use: No   Drug use: No     Allergies   Patient has no known allergies.   Review of Systems Review of Systems Per HPI  Physical Exam Triage Vital Signs ED Triage Vitals [08/24/21 0953]  Enc Vitals Group     BP 126/73     Pulse Rate 79     Resp 18     Temp 98.4 F (36.9 C)     Temp src      SpO2 97 %     Weight      Height      Head Circumference      Peak Flow      Pain Score 10     Pain Loc      Pain Edu?      Excl. in [place]?    No data found.  Updated Vital Signs BP 126/73   Pulse 79   Temp 98.4 F (36.9 C)   Resp 18   SpO2 97%   Visual Acuity Right Eye Distance:   Left Eye Distance:   Bilateral Distance:    Right Eye Near:   Left Eye Near:    Bilateral Near:     Physical Exam Vitals and nursing note reviewed.  Constitutional:      Appearance: Normal appearance. She is not ill-appearing.  HENT:     Head: Atraumatic.  Eyes:     Extraocular Movements: Extraocular movements intact.     Conjunctiva/sclera: Conjunctivae normal.  Cardiovascular:     Rate and Rhythm: Normal rate and regular rhythm.     Heart sounds: Normal heart sounds.  Pulmonary:     Effort: Pulmonary effort is normal.     Breath sounds: Normal breath sounds.  Musculoskeletal:        General: Tenderness and signs of injury present. No swelling or deformity. Normal range of motion.     Cervical back: Normal range of motion and neck supple.     Comments: Right trapezius and diffuse shoulder tender to palpation range of motion intact.  Right lumbar musculature tender to palpation laterally extending down into posterior right buttock.  No midline spinal tenderness to palpation diffusely.  Normal gait, negative straight leg raise.  Grip strength full and equal bilateral upper extremities. Left ankle minimally edematous, diffusely tender to palpation around lateral malleolus, range of motion intact  Skin:     General: Skin is warm and dry.     Findings: No bruising or erythema.  Neurological:     Mental Status: She is alert and oriented to person, place, and time.     Comments: All 4 extremities neurovascularly intact  Psychiatric:        Mood and Affect: Mood normal.        Thought Content: Thought content normal.        Judgment: Judgment normal.     UC Treatments / Results  Labs (all labs ordered are listed, but only abnormal results are displayed) Labs Reviewed - No data to display  EKG   Radiology DG Ankle Complete Left  Result Date: 08/24/2021 CLINICAL DATA:  Fall, lateral ankle pain EXAM: LEFT ANKLE COMPLETE - 3+ VIEW COMPARISON:  None. FINDINGS: There is no evidence of fracture, dislocation, or joint effusion. There is no evidence of arthropathy or other focal bone abnormality. Soft tissues are unremarkable. IMPRESSION: No fracture or dislocation of the left ankle. Electronically Signed   By: Delanna Ahmadi M.D.   On: 08/24/2021 10:16    Procedures Procedures (including critical care time)  Medications Ordered in UC Medications - No data to display  Initial Impression / Assessment and Plan / UC Course  I have reviewed  the triage vital signs and the nursing notes.  Pertinent labs & imaging results that were available during my care of the patient were reviewed by me and considered in my medical decision making (see chart for details).     X-ray left ankle negative for acute bony injury, exam revealing muscular injuries related to the impact of the fall.  We will treat with Flexeril, naproxen, heat, massage, stretches.  Work and school notes given.  Return for acutely worsening symptoms.  Final Clinical Impressions(s) / UC Diagnoses   Final diagnoses:  Sprain of left ankle, unspecified ligament, initial encounter  Acute right-sided low back pain without sciatica  Acute pain of right shoulder   Discharge Instructions   None    ED Prescriptions     Medication Sig  Dispense Auth. Provider   cyclobenzaprine (FLEXERIL) 10 MG tablet Take 1 tablet (10 mg total) by mouth 3 (three) times daily as needed for muscle spasms. Do not drink alcohol or drive while taking this medication.  May cause drowsiness. 15 tablet Volney American, [place]   naproxen (NAPROSYN) 500 MG tablet Take 1 tablet (500 mg total) by mouth 2 (two) times daily. 30 tablet Volney American, [place]      PDMP not reviewed this encounter.   Volney American, [place] 08/24/21 1914

## 2021-08-24 NOTE — ED Triage Notes (Signed)
Pt is present today with lower back pain, left ankle pain, and right neck pain. Pt states that she fell yesterday in the grocery store.

## 2021-09-15 ENCOUNTER — Telehealth: Payer: Self-pay | Admitting: Medical

## 2021-09-15 ENCOUNTER — Other Ambulatory Visit: Payer: Self-pay | Admitting: Medical

## 2021-09-15 MED ORDER — SERTRALINE HCL 25 MG PO TABS: 25.0000 mg | ORAL_TABLET | Freq: Every day | ORAL | 0 refills | Status: DC

## 2021-09-15 NOTE — Telephone Encounter (Signed)
Pt called for refills of Zoloft. She does have a appt scheduled for December. Please send to [facility] on [address] and pt can be reached at [phone].

## 2021-10-12 ENCOUNTER — Ambulatory Visit: Payer: 59 | Admitting: Medical

## 2021-10-12 ENCOUNTER — Encounter: Payer: Self-pay | Admitting: Medical

## 2021-10-12 DIAGNOSIS — Z0000 Encounter for general adult medical examination without abnormal findings: Secondary | ICD-10-CM

## 2021-10-12 NOTE — Progress Notes (Signed)
No show

## 2021-11-09 ENCOUNTER — Encounter: Payer: Self-pay | Admitting: Internal Medicine

## 2021-11-14 ENCOUNTER — Encounter: Payer: Self-pay | Admitting: Internal Medicine

## 2021-11-17 ENCOUNTER — Ambulatory Visit (INDEPENDENT_AMBULATORY_CARE_PROVIDER_SITE_OTHER): Payer: 59 | Admitting: Medical

## 2021-11-17 ENCOUNTER — Other Ambulatory Visit: Payer: Self-pay

## 2021-11-17 VITALS — BP 120/80 | HR 75 | Ht 70.0 in | Wt 258.6 lb

## 2021-11-17 DIAGNOSIS — R3589 Other polyuria: Secondary | ICD-10-CM | POA: Diagnosis not present

## 2021-11-17 DIAGNOSIS — D649 Anemia, unspecified: Secondary | ICD-10-CM

## 2021-11-17 DIAGNOSIS — R5383 Other fatigue: Secondary | ICD-10-CM | POA: Diagnosis not present

## 2021-11-17 DIAGNOSIS — R631 Polydipsia: Secondary | ICD-10-CM | POA: Diagnosis not present

## 2021-11-17 DIAGNOSIS — J4541 Moderate persistent asthma with (acute) exacerbation: Secondary | ICD-10-CM | POA: Diagnosis not present

## 2021-11-17 DIAGNOSIS — N946 Dysmenorrhea, unspecified: Secondary | ICD-10-CM

## 2021-11-17 LAB — POCT HEMOGLOBIN: Hemoglobin: 10 g/dL — AB (ref 11–14.6)

## 2021-11-17 LAB — POCT CBG (FASTING - GLUCOSE)-MANUAL ENTRY: Glucose Fasting, POC: 94 mg/dL (ref 70–99)

## 2021-11-17 MED ORDER — ALBUTEROL SULFATE HFA 108 (90 BASE) MCG/ACT IN AERS: 2.0000 | INHALATION_SPRAY | RESPIRATORY_TRACT | 1 refills | Status: DC | PRN

## 2021-11-17 MED ORDER — ALBUTEROL SULFATE (2.5 MG/3ML) 0.083% IN NEBU: 2.5000 mg | INHALATION_SOLUTION | Freq: Four times a day (QID) | RESPIRATORY_TRACT | 1 refills | Status: DC | PRN

## 2021-11-17 MED ORDER — TRELEGY ELLIPTA 100-62.5-25 MCG/ACT IN AEPB: 1.0000 | INHALATION_SPRAY | Freq: Every day | RESPIRATORY_TRACT | 5 refills | Status: DC

## 2021-11-17 NOTE — Progress Notes (Signed)
Subjective:  [name] is a 46 y.o. female who presents for Chief Complaint  Patient presents with   med check    Med check on asthma     Has hx/o asthma.  Tends to get problems in spring, fall, hot and cold weather.  Since December asthma has flared up.  Coughing, cough keeping her up all night.  Has felt wheezy.  She is out of all of her inhalers.  She is also just felt not well in general.  Sinuses feel congested.  A few weeks ago she had more of a sinus infection and had some nosebleeds.  She has been feeling tired.  She does have a history of anemia.  Her menstrual periods are irregular.  Last period early December.   Periods tend to be heavy.  Not currently on iron  She does note some increased thirst and increased urination.  No history of diabetes.  No other aggravating or relieving factors.    No other c/o.  Past Medical History:  Diagnosis Date   Abdominal pain    Anemia    Asthma    Depression    Diarrhea    Heart murmur    Obesity    Plantar fasciitis    Shortness of breath    Vomiting    Current Outpatient Medications on File Prior to Visit  Medication Sig Dispense Refill   pantoprazole (PROTONIX) 40 MG tablet Take 1 tablet (40 mg total) by mouth daily. 90 tablet 3   sertraline (ZOLOFT) 25 MG tablet Take 1 tablet (25 mg total) by mouth daily. 90 tablet 0   Insulin Pen Needle (BD PEN NEEDLE NANO U/F) 32G X 4 MM MISC 1 each by Does not apply route at bedtime. 30 each 2   No current facility-administered medications on file prior to visit.    The following portions of the patient's history were reviewed and updated as appropriate: allergies, current medications, past family history, past medical history, past social history, past surgical history and problem list.  Review of Systems Constitutional: -fever, -chills, -sweats, -unexpected weight change, +fatigue ENT: -runny nose, -ear pain, +sore throat Cardiology:  -chest pain, -palpitations,  -edema Respiratory: +cough, +shortness of breath, +wheezing Gastroenterology: -abdominal pain, -nausea, -vomiting, -diarrhea, -constipation Hematology: -bleeding or bruising problems Musculoskeletal: -arthralgias, -myalgias, -joint swelling, -back pain Ophthalmology: -vision changes Urology: -dysuria, -difficulty urinating, -hematuria, +urinary frequency, -urgency Neurology: +headache, -weakness, -tingling, -numbness     Objective: BP 120/80    Pulse 75    Ht 5\' 10"  (1.778 m)    Wt 258 lb 9.6 oz (117.3 kg)    BMI 37.11 kg/m   Wt Readings from Last 3 Encounters:  11/17/21 258 lb 9.6 oz (117.3 kg)  03/15/21 272 lb 9.6 oz (123.7 kg)  10/05/20 264 lb 9.6 oz (120 kg)   BP Readings from Last 3 Encounters:  11/17/21 120/80  08/24/21 126/73  07/04/21 (!) 125/58   General appearance: alert, no distress, well developed, well nourished HEENT: normocephalic, sclerae anicteric, conjunctiva pink and moist, TMs pearly, nares patent, no discharge or erythema, pharynx normal Oral cavity: MMM, no lesions Neck: supple, no lymphadenopathy, no thyromegaly, no masses, no JVD or bruit Heart: RRR, normal S1, S2, no murmurs Lungs: decrease breath sounds in general, no wheezes, rhonchi, or rales Pulses: 2+ radial pulses, 2+ pedal pulses, normal cap refill Ext: no edema   Assessment: Encounter Diagnoses  Name Primary?   Moderate persistent asthma with acute exacerbation Yes   Dysmenorrhea  Other fatigue    Polyuria    Polydipsia    Anemia, unspecified type      Plan: We discussed possible causes of fatigue and not feeling well.  She has history of anemia, history of moderate to severe asthma.  We will do a few tests today.  I advised her to come in soon for fasting labs and physical  Asthma-PFT reviewed. Begin back on medication for asthma control.  We discussed the difference between maintenance inhalers and rescue inhalers.  Begin back on both.  Continue work on good water intake.  Try to  get 7 to 8 hours of sleep per night  Anemia -begin back on prenatal vitamin with iron.  Recheck soon for physical  Glucose fasting within normal limits today    Dustie was seen today for med check.  Diagnoses and all orders for this visit:  Moderate persistent asthma with acute exacerbation -     Spirometry with graph  Dysmenorrhea -     Hemoglobin  Other fatigue -     Hemoglobin  Polyuria -     Glucose (CBG), Fasting  Polydipsia -     Glucose (CBG), Fasting  Anemia, unspecified type  Other orders -     albuterol (VENTOLIN HFA) 108 (90 Base) MCG/ACT inhaler; Inhale 2 puffs into the lungs every 4 (four) hours as needed for wheezing or shortness of breath. -     albuterol (PROVENTIL) (2.5 MG/3ML) 0.083% nebulizer solution; Take 3 mLs (2.5 mg total) by nebulization every 6 (six) hours as needed for wheezing or shortness of breath. -     Fluticasone-Umeclidin-Vilant (TRELEGY ELLIPTA) 100-62.5-25 MCG/ACT AEPB; Inhale 1 puff into the lungs daily.   Follow up: pending labs, soon for physical

## 2021-11-23 ENCOUNTER — Telehealth: Payer: Self-pay

## 2021-11-23 NOTE — Telephone Encounter (Signed)
Pt. Called wanting to know if you could call in some diflucan for yeast. I told her she may need a visit first she wanted me to send you a message first to see if you could send it in for her.

## 2021-11-24 ENCOUNTER — Other Ambulatory Visit: Payer: Self-pay | Admitting: Medical

## 2021-11-24 MED ORDER — FLUCONAZOLE 150 MG PO TABS: 150.0000 mg | ORAL_TABLET | ORAL | 0 refills | Status: DC

## 2021-12-07 ENCOUNTER — Encounter: Payer: 59 | Admitting: Medical

## 2021-12-14 ENCOUNTER — Other Ambulatory Visit: Payer: Self-pay | Admitting: Medical

## 2021-12-24 ENCOUNTER — Emergency Department (HOSPITAL_COMMUNITY): Payer: 59

## 2021-12-24 ENCOUNTER — Encounter (HOSPITAL_COMMUNITY): Payer: Self-pay | Admitting: Emergency Medicine

## 2021-12-24 ENCOUNTER — Inpatient Hospital Stay (HOSPITAL_COMMUNITY)
Admission: EM | Admit: 2021-12-24 | Discharge: 2021-12-25 | DRG: 871 | Disposition: A | Discharge status: Home / Self Care | Payer: 59 | Attending: Internal Medicine | Admitting: Internal Medicine

## 2021-12-24 ENCOUNTER — Encounter (HOSPITAL_COMMUNITY): Payer: Self-pay

## 2021-12-24 ENCOUNTER — Ambulatory Visit (HOSPITAL_COMMUNITY)
Admission: EM | Admit: 2021-12-24 | Discharge: 2021-12-24 | Disposition: A | Discharge status: Home / Self Care | Payer: 59 | Attending: Family Medicine | Admitting: Family Medicine

## 2021-12-24 ENCOUNTER — Other Ambulatory Visit: Payer: Self-pay

## 2021-12-24 DIAGNOSIS — A4189 Other specified sepsis: Principal | ICD-10-CM | POA: Diagnosis present

## 2021-12-24 DIAGNOSIS — Z9049 Acquired absence of other specified parts of digestive tract: Secondary | ICD-10-CM

## 2021-12-24 DIAGNOSIS — K219 Gastro-esophageal reflux disease without esophagitis: Secondary | ICD-10-CM | POA: Diagnosis present

## 2021-12-24 DIAGNOSIS — M722 Plantar fascial fibromatosis: Secondary | ICD-10-CM | POA: Diagnosis present

## 2021-12-24 DIAGNOSIS — Z8249 Family history of ischemic heart disease and other diseases of the circulatory system: Secondary | ICD-10-CM | POA: Diagnosis not present

## 2021-12-24 DIAGNOSIS — J45901 Unspecified asthma with (acute) exacerbation: Secondary | ICD-10-CM

## 2021-12-24 DIAGNOSIS — Z800 Family history of malignant neoplasm of digestive organs: Secondary | ICD-10-CM

## 2021-12-24 DIAGNOSIS — Z833 Family history of diabetes mellitus: Secondary | ICD-10-CM | POA: Diagnosis not present

## 2021-12-24 DIAGNOSIS — Z825 Family history of asthma and other chronic lower respiratory diseases: Secondary | ICD-10-CM

## 2021-12-24 DIAGNOSIS — R000 Tachycardia, unspecified: Secondary | ICD-10-CM

## 2021-12-24 DIAGNOSIS — Z801 Family history of malignant neoplasm of trachea, bronchus and lung: Secondary | ICD-10-CM

## 2021-12-24 DIAGNOSIS — J1282 Pneumonia due to coronavirus disease 2019: Secondary | ICD-10-CM | POA: Diagnosis present

## 2021-12-24 DIAGNOSIS — R0600 Dyspnea, unspecified: Secondary | ICD-10-CM | POA: Diagnosis present

## 2021-12-24 DIAGNOSIS — B9789 Other viral agents as the cause of diseases classified elsewhere: Secondary | ICD-10-CM | POA: Diagnosis present

## 2021-12-24 DIAGNOSIS — U071 COVID-19: Secondary | ICD-10-CM | POA: Diagnosis present

## 2021-12-24 DIAGNOSIS — F32A Depression, unspecified: Secondary | ICD-10-CM

## 2021-12-24 DIAGNOSIS — J4541 Moderate persistent asthma with (acute) exacerbation: Secondary | ICD-10-CM

## 2021-12-24 DIAGNOSIS — Z79899 Other long term (current) drug therapy: Secondary | ICD-10-CM

## 2021-12-24 HISTORY — DX: Gastro-esophageal reflux disease without esophagitis: K21.9

## 2021-12-24 LAB — COMPREHENSIVE METABOLIC PANEL
ALT: 20 U/L (ref 0–44)
AST: 23 U/L (ref 15–41)
Albumin: 3.6 g/dL (ref 3.5–5.0)
Alkaline Phosphatase: 57 U/L (ref 38–126)
Anion gap: 14 (ref 5–15)
BUN: 8 mg/dL (ref 6–20)
CO2: 18 mmol/L — ABNORMAL LOW (ref 22–32)
Calcium: 8.9 mg/dL (ref 8.9–10.3)
Chloride: 104 mmol/L (ref 98–111)
Creatinine, Ser: 0.83 mg/dL (ref 0.44–1.00)
GFR, Estimated: >60 mL/min (ref >60)
Glucose, Bld: 87 mg/dL (ref 70–99)
Potassium: 3.9 mmol/L (ref 3.5–5.1)
Sodium: 136 mmol/L (ref 135–145)
Total Bilirubin: 0.6 mg/dL (ref 0.3–1.2)
Total Protein: 7.4 g/dL (ref 6.5–8.1)

## 2021-12-24 LAB — I-STAT CHEM 8, ED
BUN: 7 mg/dL (ref 6–20)
Calcium, Ion: 1.03 mmol/L — ABNORMAL LOW (ref 1.15–1.40)
Chloride: 108 mmol/L (ref 98–111)
Creatinine, Ser: 0.8 mg/dL (ref 0.44–1.00)
Glucose, Bld: 86 mg/dL (ref 70–99)
HCT: 36 % (ref 36.0–46.0)
Hemoglobin: 12.2 g/dL (ref 12.0–15.0)
Potassium: 3.9 mmol/L (ref 3.5–5.1)
Sodium: 139 mmol/L (ref 135–145)
TCO2: 22 mmol/L (ref 22–32)

## 2021-12-24 LAB — CBC WITH DIFFERENTIAL/PLATELET
Abs Immature Granulocytes: 0.02 10*3/uL (ref 0.00–0.07)
Basophils Absolute: 0 10*3/uL (ref 0.0–0.1)
Basophils Relative: 0 %
Eosinophils Absolute: 0 10*3/uL (ref 0.0–0.5)
Eosinophils Relative: 1 %
HCT: 37 % (ref 36.0–46.0)
Hemoglobin: 11.1 g/dL — ABNORMAL LOW (ref 12.0–15.0)
Immature Granulocytes: 0 %
Lymphocytes Relative: 30 %
Lymphs Abs: 1.4 10*3/uL (ref 0.7–4.0)
MCH: 23.3 pg — ABNORMAL LOW (ref 26.0–34.0)
MCHC: 30 g/dL (ref 30.0–36.0)
MCV: 77.6 fL — ABNORMAL LOW (ref 80.0–100.0)
Monocytes Absolute: 0.6 10*3/uL (ref 0.1–1.0)
Monocytes Relative: 13 %
Neutro Abs: 2.7 10*3/uL (ref 1.7–7.7)
Neutrophils Relative %: 56 %
Platelets: 185 10*3/uL (ref 150–400)
RBC: 4.77 MIL/uL (ref 3.87–5.11)
RDW: 13.8 % (ref 11.5–15.5)
WBC: 4.8 10*3/uL (ref 4.0–10.5)
nRBC: 0 % (ref 0.0–0.2)

## 2021-12-24 LAB — I-STAT BETA HCG BLOOD, ED (MC, WL, AP ONLY): I-stat hCG, quantitative: <5 m[IU]/mL (ref <5)

## 2021-12-24 LAB — LIPASE, BLOOD: Lipase: 25 U/L (ref 11–51)

## 2021-12-24 LAB — GROUP A STREP BY PCR: Group A Strep by PCR: NOT DETECTED

## 2021-12-24 LAB — RESP PANEL BY RT-PCR (FLU A&B, COVID) ARPGX2
Influenza A by PCR: NEGATIVE
Influenza B by PCR: NEGATIVE
SARS Coronavirus 2 by RT PCR: POSITIVE — AB

## 2021-12-24 LAB — LACTIC ACID, PLASMA: Lactic Acid, Venous: 1.5 mmol/L (ref 0.5–1.9)

## 2021-12-24 MED ORDER — GUAIFENESIN ER 600 MG PO TB12
600.0000 mg | ORAL_TABLET | Freq: Two times a day (BID) | ORAL | Status: DC
  Administered 2021-12-24 – 2021-12-25 (×2): 600 mg via ORAL
  Filled 2021-12-24 (×2): qty 1

## 2021-12-24 MED ORDER — FLUCONAZOLE 150 MG PO TABS: 150.0000 mg | ORAL_TABLET | ORAL | Status: DC

## 2021-12-24 MED ORDER — ACETAMINOPHEN 325 MG PO TABS
650.0000 mg | ORAL_TABLET | Freq: Once | ORAL | Status: AC
  Administered 2021-12-24: 650 mg via ORAL

## 2021-12-24 MED ORDER — VITAMIN D 25 MCG (1000 UNIT) PO TABS
1000.0000 [IU] | ORAL_TABLET | Freq: Every day | ORAL | Status: DC
Start: 2021-12-24 — End: 2021-12-25
  Administered 2021-12-24 – 2021-12-25 (×2): 1000 [IU] via ORAL
  Filled 2021-12-24 (×2): qty 1

## 2021-12-24 MED ORDER — SODIUM CHLORIDE 0.9 % IV SOLN: INTRAVENOUS | Status: DC

## 2021-12-24 MED ORDER — ALBUTEROL SULFATE (2.5 MG/3ML) 0.083% IN NEBU
2.5000 mg | INHALATION_SOLUTION | Freq: Once | RESPIRATORY_TRACT | Status: AC
  Administered 2021-12-24: 2.5 mg via RESPIRATORY_TRACT

## 2021-12-24 MED ORDER — ENOXAPARIN SODIUM 40 MG/0.4ML IJ SOSY
40.0000 mg | PREFILLED_SYRINGE | INTRAMUSCULAR | Status: DC
  Administered 2021-12-24: 40 mg via SUBCUTANEOUS
  Filled 2021-12-24: qty 0.4

## 2021-12-24 MED ORDER — ZINC SULFATE 220 (50 ZN) MG PO CAPS
220.0000 mg | ORAL_CAPSULE | Freq: Every day | ORAL | Status: DC
  Administered 2021-12-24 – 2021-12-25 (×2): 220 mg via ORAL
  Filled 2021-12-24 (×2): qty 1

## 2021-12-24 MED ORDER — HYDROCOD POLI-CHLORPHE POLI ER 10-8 MG/5ML PO SUER: 5.0000 mL | Freq: Two times a day (BID) | ORAL | Status: DC | PRN

## 2021-12-24 MED ORDER — METHYLPREDNISOLONE SODIUM SUCC 125 MG IJ SOLR
1.0000 mg/kg | Freq: Two times a day (BID) | INTRAMUSCULAR | Status: DC
  Administered 2021-12-24 – 2021-12-25 (×2): 120 mg via INTRAVENOUS
  Filled 2021-12-24 (×2): qty 2

## 2021-12-24 MED ORDER — IOHEXOL 350 MG/ML SOLN
100.0000 mL | Freq: Once | INTRAVENOUS | Status: AC | PRN
  Administered 2021-12-24: 100 mL via INTRAVENOUS

## 2021-12-24 MED ORDER — MAGNESIUM HYDROXIDE 400 MG/5ML PO SUSP
30.0000 mL | Freq: Every day | ORAL | Status: DC | PRN
  Filled 2021-12-24: qty 30

## 2021-12-24 MED ORDER — SODIUM CHLORIDE 0.9 % IV BOLUS
500.0000 mL | Freq: Once | INTRAVENOUS | Status: AC
  Administered 2021-12-24: 500 mL via INTRAVENOUS

## 2021-12-24 MED ORDER — ACETAMINOPHEN 325 MG PO TABS
650.0000 mg | ORAL_TABLET | Freq: Four times a day (QID) | ORAL | Status: DC | PRN
  Administered 2021-12-24: 650 mg via ORAL
  Filled 2021-12-24: qty 2

## 2021-12-24 MED ORDER — KETOROLAC TROMETHAMINE 30 MG/ML IJ SOLN
30.0000 mg | Freq: Once | INTRAMUSCULAR | Status: AC
  Administered 2021-12-24: 30 mg via INTRAVENOUS
  Filled 2021-12-24: qty 1

## 2021-12-24 MED ORDER — IPRATROPIUM-ALBUTEROL 0.5-2.5 (3) MG/3ML IN SOLN
3.0000 mL | Freq: Four times a day (QID) | RESPIRATORY_TRACT | Status: DC
  Administered 2021-12-24 – 2021-12-25 (×3): 3 mL via RESPIRATORY_TRACT
  Filled 2021-12-24 (×3): qty 3

## 2021-12-24 MED ORDER — GUAIFENESIN-DM 100-10 MG/5ML PO SYRP
10.0000 mL | ORAL_SOLUTION | ORAL | Status: DC | PRN
  Administered 2021-12-24: 10 mL via ORAL
  Filled 2021-12-24: qty 10

## 2021-12-24 MED ORDER — METHYLPREDNISOLONE SODIUM SUCC 125 MG IJ SOLR
125.0000 mg | Freq: Once | INTRAMUSCULAR | Status: AC
  Administered 2021-12-24: 125 mg via INTRAVENOUS
  Filled 2021-12-24: qty 2

## 2021-12-24 MED ORDER — ONDANSETRON HCL 4 MG PO TABS: 4.0000 mg | ORAL_TABLET | Freq: Four times a day (QID) | ORAL | Status: DC | PRN

## 2021-12-24 MED ORDER — ALBUTEROL SULFATE (2.5 MG/3ML) 0.083% IN NEBU
INHALATION_SOLUTION | RESPIRATORY_TRACT | Status: AC
  Filled 2021-12-24: qty 3

## 2021-12-24 MED ORDER — PANTOPRAZOLE SODIUM 40 MG PO TBEC
40.0000 mg | DELAYED_RELEASE_TABLET | Freq: Every day | ORAL | Status: DC
  Administered 2021-12-25: 40 mg via ORAL
  Filled 2021-12-24: qty 1

## 2021-12-24 MED ORDER — ASPIRIN EC 81 MG PO TBEC
81.0000 mg | DELAYED_RELEASE_TABLET | Freq: Every day | ORAL | Status: DC
  Administered 2021-12-25: 81 mg via ORAL
  Filled 2021-12-24: qty 1

## 2021-12-24 MED ORDER — IPRATROPIUM-ALBUTEROL 0.5-2.5 (3) MG/3ML IN SOLN
3.0000 mL | Freq: Once | RESPIRATORY_TRACT | Status: AC
  Administered 2021-12-24: 3 mL via RESPIRATORY_TRACT
  Filled 2021-12-24: qty 3

## 2021-12-24 MED ORDER — SERTRALINE HCL 50 MG PO TABS
25.0000 mg | ORAL_TABLET | Freq: Every day | ORAL | Status: DC
  Administered 2021-12-25: 25 mg via ORAL
  Filled 2021-12-24 (×2): qty 0.5

## 2021-12-24 MED ORDER — ACETAMINOPHEN 325 MG PO TABS
ORAL_TABLET | ORAL | Status: AC
  Filled 2021-12-24: qty 2

## 2021-12-24 MED ORDER — ONDANSETRON HCL 4 MG/2ML IJ SOLN
4.0000 mg | Freq: Four times a day (QID) | INTRAMUSCULAR | Status: DC | PRN
Start: 2021-12-24 — End: 2021-12-25

## 2021-12-24 MED ORDER — FAMOTIDINE 20 MG PO TABS
20.0000 mg | ORAL_TABLET | Freq: Two times a day (BID) | ORAL | Status: DC
  Administered 2021-12-24 – 2021-12-25 (×2): 20 mg via ORAL
  Filled 2021-12-24 (×2): qty 1

## 2021-12-24 MED ORDER — PREDNISONE 20 MG PO TABS: 50.0000 mg | ORAL_TABLET | Freq: Every day | ORAL | Status: DC

## 2021-12-24 MED ORDER — SODIUM CHLORIDE 0.9 % IV BOLUS
1000.0000 mL | Freq: Once | INTRAVENOUS | Status: AC
  Administered 2021-12-24: 1000 mL via INTRAVENOUS

## 2021-12-24 MED ORDER — TRAZODONE HCL 50 MG PO TABS: 25.0000 mg | ORAL_TABLET | Freq: Every evening | ORAL | Status: DC | PRN

## 2021-12-24 MED ORDER — ASCORBIC ACID 500 MG PO TABS
500.0000 mg | ORAL_TABLET | Freq: Every day | ORAL | Status: DC
  Administered 2021-12-24 – 2021-12-25 (×2): 500 mg via ORAL
  Filled 2021-12-24 (×2): qty 1

## 2021-12-24 MED ORDER — MAGNESIUM SULFATE 2 GM/50ML IV SOLN
2.0000 g | Freq: Once | INTRAVENOUS | Status: AC
  Administered 2021-12-24: 2 g via INTRAVENOUS
  Filled 2021-12-24: qty 50

## 2021-12-24 NOTE — Assessment & Plan Note (Addendum)
-  Present on admission with fever, tachycardia and tachypnea -Remains on room air   COVID-19 Pneumonia -Tested positive on 2/26 -Continue steroids (due to asthma) -Patient was hesitant to start remdesivir on admission  -Supportive treatments as ordered, encourage IS, encourage prone positioning, encourage mobilization   COVID-19 Labs  Recent Labs    12/24/21 2330 12/25/21 0511  FERRITIN 62 55  LDH 156  --   CRP 1.3* 1.1*    Lab Results  Component Value Date   SARSCOV2NAA POSITIVE (A) 12/24/2021   [place] NEGATIVE 11/05/2020   [place] Not Detected 07/29/2019   [place] Not Detected 07/18/2019

## 2021-12-24 NOTE — ED Provider Notes (Signed)
[facility] EMERGENCY DEPARTMENT Provider Note   CSN: [id number] Arrival date & time: 12/24/21  1439     History  Chief Complaint  Patient presents with   SOB    Jimia Gentles is a 46 y.o. female hx of asthma here for evaluation of feeling unwell. Since Monday has been having Cough, SOB, sore throat, diarrhea, myalgias. Using home nebs.  Last neb at [facility]. No recent steroids or admissions. Did see health at work on Tuesday and was neg for [place], flu. She feels warm. Given Tylenol by UC PTA. Did have multiple sick contacts positive for COVID in family. No hx of PE, DVT. Some congestion and rhinorrhea. Diffuse myalgias.  At UC wheezing, not moving much air.  HPI     Home Medications Prior to Admission medications   Medication Sig Start Date End Date Taking? Authorizing Provider  albuterol (PROVENTIL) (2.5 MG/3ML) 0.083% nebulizer solution Take 3 mLs (2.5 mg total) by nebulization every 6 (six) hours as needed for wheezing or shortness of breath. 11/17/21   Tysinger, Camelia Eng, PA-C  albuterol (VENTOLIN HFA) 108 (90 Base) MCG/ACT inhaler Inhale 2 puffs into the lungs every 4 (four) hours as needed for wheezing or shortness of breath. 11/17/21   Tysinger, Camelia Eng, PA-C  fluconazole (DIFLUCAN) 150 MG tablet Take 1 tablet (150 mg total) by mouth once a week. 11/24/21   Tysinger, Camelia Eng, PA-C  Fluticasone-Umeclidin-Vilant (TRELEGY ELLIPTA) 100-62.5-25 MCG/ACT AEPB Inhale 1 puff into the lungs daily. 11/17/21   Tysinger, Camelia Eng, PA-C  Insulin Pen Needle (BD PEN NEEDLE NANO U/F) 32G X 4 MM MISC 1 each by Does not apply route at bedtime. 10/05/20   Tysinger, Camelia Eng, PA-C  pantoprazole (PROTONIX) 40 MG tablet Take 1 tablet (40 mg total) by mouth daily. 10/05/20   Tysinger, Camelia Eng, PA-C  sertraline (ZOLOFT) 25 MG tablet TAKE 1 TABLET(25 MG) BY MOUTH DAILY 12/14/21   Tysinger, Camelia Eng, PA-C      Allergies    Patient has no known allergies.    Review of Systems   Review  of Systems  Constitutional:  Positive for activity change, appetite change, chills, fatigue and fever.  HENT:  Positive for congestion, postnasal drip, rhinorrhea, sneezing and sore throat. Negative for sinus pressure, sinus pain, trouble swallowing and voice change.   Respiratory:  Positive for cough, shortness of breath and wheezing. Negative for choking, chest tightness and stridor.   Cardiovascular:  Negative for palpitations and leg swelling.  Gastrointestinal:  Positive for abdominal pain (diffuse), diarrhea and nausea. Negative for rectal pain and vomiting.  Genitourinary: Negative.   Musculoskeletal:  Positive for myalgias.  Skin: Negative.   Neurological:  Positive for weakness (generalized) and headaches. Negative for dizziness, tremors, seizures, syncope, facial asymmetry, speech difficulty, light-headedness and numbness.  All other systems reviewed and are negative.  Physical Exam Updated Vital Signs BP (!) 158/87    Pulse 94    Temp (!) 101.3 F (38.5 C) (Oral)    Resp (!) 29    LMP  (LMP Unknown)    SpO2 98%  Physical Exam Vitals and nursing note reviewed.  Constitutional:      General: She is not in acute distress.    Appearance: She is well-developed. She is obese. She is ill-appearing. She is not toxic-appearing or diaphoretic.  HENT:     Head: Normocephalic and atraumatic.     Nose: Congestion and rhinorrhea present.     Mouth/Throat:  Pharynx: Posterior oropharyngeal erythema present. No oropharyngeal exudate.  Eyes:     Pupils: Pupils are equal, round, and reactive to light.  Cardiovascular:     Rate and Rhythm: Tachycardia present.     Pulses: Normal pulses.          Radial pulses are 2+ on the right side and 2+ on the left side.       Dorsalis pedis pulses are 2+ on the right side and 2+ on the left side.     Heart sounds: Normal heart sounds.  Pulmonary:     Effort: Respiratory distress present.     Breath sounds: Wheezing present.  Abdominal:      General: Bowel sounds are normal. There is no distension.     Palpations: Abdomen is soft.     Tenderness: There is no abdominal tenderness. There is no right CVA tenderness, left CVA tenderness, guarding or rebound.  Musculoskeletal:        General: No swelling, tenderness, deformity or signs of injury. Normal range of motion.     Cervical back: Normal range of motion.     Right lower leg: No edema.     Left lower leg: No edema.  Skin:    General: Skin is warm and dry.     Capillary Refill: Capillary refill takes less than 2 seconds.  Neurological:     General: No focal deficit present.     Mental Status: She is alert and oriented to person, place, and time.     Cranial Nerves: Cranial nerves 2-12 are intact. No cranial nerve deficit.     Sensory: Sensation is intact. No sensory deficit.     Motor: Motor function is intact. No weakness.     Coordination: Coordination is intact. Coordination normal.     Gait: Gait is intact.  Psychiatric:        Mood and Affect: Mood normal.    ED Results / Procedures / Treatments   Labs (all labs ordered are listed, but only abnormal results are displayed) Labs Reviewed  RESP PANEL BY RT-PCR (FLU A&B, COVID) ARPGX2 - Abnormal; Notable for the following components:      Result Value   SARS Coronavirus 2 by RT PCR POSITIVE (*)    All other components within normal limits  CBC WITH DIFFERENTIAL/PLATELET - Abnormal; Notable for the following components:   Hemoglobin 11.1 (*)    MCV 77.6 (*)    MCH 23.3 (*)    All other components within normal limits  COMPREHENSIVE METABOLIC PANEL - Abnormal; Notable for the following components:   CO2 18 (*)    All other components within normal limits  I-STAT CHEM 8, ED - Abnormal; Notable for the following components:   Calcium, Ion 1.03 (*)    All other components within normal limits  GROUP A STREP BY PCR  CULTURE, BLOOD (ROUTINE X 2)  CULTURE, BLOOD (ROUTINE X 2)  LIPASE, BLOOD  LACTIC ACID, PLASMA   LACTIC ACID, PLASMA  BRAIN NATRIURETIC PEPTIDE  I-STAT BETA HCG BLOOD, ED ([facility], [facility], [facility] ONLY)    EKG None  Radiology CT Angio Chest PE W/Cm &/Or Wo Cm  Result Date: 12/24/2021 CLINICAL DATA:  Pulmonary embolism suspected. EXAM: CT ANGIOGRAPHY CHEST WITH CONTRAST TECHNIQUE: Multidetector CT imaging of the chest was performed using the standard protocol during bolus administration of intravenous contrast. Multiplanar CT image reconstructions and MIPs were obtained to evaluate the vascular anatomy. RADIATION DOSE REDUCTION: This exam was performed according to  the departmental dose-optimization program which includes automated exposure control, adjustment of the mA and/or kV according to patient size and/or use of iterative reconstruction technique. CONTRAST:  125mL OMNIPAQUE IOHEXOL 350 MG/ML SOLN COMPARISON:  Chest x-ray today FINDINGS: Cardiovascular: Heart size is normal. Trace pericardial effusion is identified, measuring 1 centimeter anteriorly. Thoracic aorta is not aneurysmal. The pulmonary arteries are only moderately well opacified by the contrast bolus. Although large pulmonary emboli are not identified, segmental and subsegmental emboli would be difficult to exclude. Mediastinum/Nodes: No enlarged mediastinal, hilar, or axillary lymph nodes. Thyroid gland, trachea, and esophagus demonstrate no significant findings. Lungs/Pleura: Subsegmental atelectasis identified at the lung bases. There are trace bilateral pleural effusions. Upper Abdomen: Status post cholecystectomy.  No acute abnormality. Musculoskeletal: No chest wall abnormality. No acute or significant osseous findings. Review of the MIP images confirms the above findings. IMPRESSION: 1. Poor opacification of the pulmonary arteries. No large embolism identified. Segmental or subsegmental emboli would be difficult to entirely exclude. 2. Small pericardial effusion. 3. Subsegmental atelectasis at the lung bases. 4. Status post  cholecystectomy. Electronically Signed   By: Nolon Nations M.D.   On: 12/24/2021 18:11   DG Chest Portable 1 View  Result Date: 12/24/2021 CLINICAL DATA:  Wheezing, diarrhea EXAM: PORTABLE CHEST 1 VIEW COMPARISON:  11/05/2020 FINDINGS: Low lung volumes. Normal cardiac silhouette. Mild venous congestion pattern. No pulmonary edema. No pneumothorax. IMPRESSION: Low lung volumes and mild venous congestion pattern. Electronically Signed   By: Suzy Bouchard M.D.   On: 12/24/2021 15:57    Procedures .Critical Care Performed by: Nettie Elm, PA-C Authorized by: Nettie Elm, PA-C   Critical care provider statement:    Critical care time (minutes):  32   Critical care was necessary to treat or prevent imminent or life-threatening deterioration of the following conditions:  Respiratory failure   Critical care was time spent personally by me on the following activities:  Development of treatment plan with patient or surrogate, discussions with consultants, evaluation of patient's response to treatment, examination of patient, ordering and review of laboratory studies, ordering and review of radiographic studies, ordering and performing treatments and interventions, pulse oximetry, re-evaluation of patient's condition and review of old charts    Medications Ordered in ED Medications  ketorolac (TORADOL) 30 MG/ML injection 30 mg (has no administration in time range)  sodium chloride 0.9 % bolus 1,000 mL (has no administration in time range)  methylPREDNISolone sodium succinate (SOLU-MEDROL) 125 mg/2 mL injection 125 mg (125 mg Intravenous Given 12/24/21 1653)  ipratropium-albuterol (DUONEB) 0.5-2.5 (3) MG/3ML nebulizer solution 3 mL (3 mLs Nebulization Given 12/24/21 1557)  magnesium sulfate IVPB 2 g 50 mL (0 g Intravenous Stopped 12/24/21 1736)  sodium chloride 0.9 % bolus 500 mL (0 mLs Intravenous Stopped 12/24/21 1727)  iohexol (OMNIPAQUE) 350 MG/ML injection 100 mL (100 mLs  Intravenous Contrast Given 12/24/21 1746)    ED Course/ Medical Decision Making/ A&P    46 year old here for evaluation of feeling unwell onset just under a week ago, neg COVID test at home Tuesday despite multiple positive contacts. Extensive hx of asthma with poor air movement and wheeze on exam, seen by UC PTA given tylenol for fever and albuterol treatment. LE without edema, does not appear grossly fluid overloaded. No risk factors for PE, DVT, no clinical evidence of VTE on exam. Some diarrhea and admit to diffuse abd cramping however no focal tenderness on exam. Diffuse PO erythema without obvious PTA, RPA. Febrile and tachycardic. Will  treat asthma exacerbation, labs and imaging.  Labs and imaging viewed and interpreted:  CBC no leukocytosis, hemoglobin 11.1, similar to prior CMP co2 18 no significant abnormality Lipase 25 Lactic 1.5 Preg neg COVID positive Strep neg DG chest no infiltrates pneumothorax, some possible vascular congestion however when I viewed her prior chest xray from 1/22 seem similar to prior exam EKG tachycardia CTA neg for large PE, difficult to assess for smaller due to contrast bolus, trace pericardial effusion  Patient reassessed. States feels significantly improved however does still have some poor air movement.  Will obtain CTA chest given family history of clotting disorder.  She is still getting her magnesium, DuoNeb, steroids less than 1 hour ago.  We will plan on reassessment.  Nursing attempted to ambulate patient stated too dizzy and SOB.  I went and reassessed Patient. Attempted to ambulate patient. Began dizzy, SOB, tachycardic to 130's at tachypnea to 45 while just standing at bedside. Admits to HA and dizziness. Has non focal neuro exam without deficits. Will given toradol for HA. Given persistent tachycardia, tachypnea will admit for further management.  CONSULT with Dr.  Mitchell Heir with [facility] who agrees to evaluate patient for admission.  Discussed  admission with patient, family in room.  Agreeable.  Suspect will need additional breathing treatments for asthma despite multiple here in the emergency department  The patient appears reasonably stabilized for admission considering the current resources, flow, and capabilities available in the ED at this time, and I doubt any other [facility] requiring further screening and/or treatment in the ED prior to admission.                            Medical Decision Making Amount and/or Complexity of Data Reviewed Independent Historian: EMS Labs: ordered. Decision-making details documented in ED Course. Radiology: ordered and independent interpretation performed. Decision-making details documented in ED Course. ECG/medicine tests: ordered and independent interpretation performed. Decision-making details documented in ED Course.  Risk OTC drugs. Prescription drug management. Parenteral controlled substances. Drug therapy requiring intensive monitoring for toxicity. Decision regarding hospitalization.         Final Clinical Impression(s) / ED Diagnoses Final diagnoses:  COVID  Moderate persistent asthma with exacerbation  Tachycardia    Rx / DC Orders ED Discharge Orders     None         Santiana Glidden A, PA-C 12/24/21 2049    Lucrezia Starch, MD 12/25/21 [phone]

## 2021-12-24 NOTE — ED Notes (Signed)
Pt refused to ambulate due dizziness, although she transfer without assistance from bed to wheelchair, she seem confused

## 2021-12-24 NOTE — H&P (Signed)
[place]   PATIENT NAME: [name]    MR#:  [id number]  DATE OF BIRTH:  10-17-1976  DATE OF ADMISSION:  12/24/2021  PRIMARY CARE PHYSICIAN: Carlena Hurl, PA-C   Patient is coming from: Home  REQUESTING/REFERRING PHYSICIAN: Henderly, Britni A, PA-C  CHIEF COMPLAINT:   Chief Complaint  Patient presents with   SOB    HISTORY OF PRESENT ILLNESS:  [name] is a 46 y.o. African-American female with medical history significant for asthma and morbid obesity, who presented to the emergency room with acute onset of worsening dyspnea and associated cough since Monday as well as sore throat, myalgia and diarrhea.  She has been having associated wheezing.  She has been using her albuterol inhaler every 2-3 hours without much help.  She was seen in urgent care today before being sent to the ER for lacking good air movement.  She had a helper at work sick visit on Tuesday and was negative for COVID and flu.  She admits to tactile fever and was febrile in the ER.  She admits to having multiple sick contacts or positive for COVID-19 in her family.  She denies any sore throat or rhinorrhea or nasal congestion.  No chest pain no palpitations.  No nausea or vomiting.  No loss of taste or smell.  No dysuria, oliguria, urinary frequency or urgency or flank pain.  She had 2 COVID vaccines and 2 boosters.  ED Course: When she came to the ER, temperature was one 1.3, BP 94/79 and later 117/69 with heart rate 105 and respiratory rate of 33 and later 25.  Pulse oximetry was 99% on room air.  Labs revealed H&H of 11.1 and 37 and later 12.2 and 36 with otherwise unremarkable CBC.  Urine pregnancy test was negative. EKG as reviewed by me : EKG showed sinus tachycardia with rate 108 and PR ventricular.  2 complexes Imaging: Chest x-ray showed low lung volumes and mild venous congestion pattern. Chest CTA revealed no large PE, small pericardial effusion, subsegmental atelectasis at the  lung bases and previous cholecystectomy.  The patient was given 2 g of IV magnesium sulfate, 125 mg of IV Solu-Medrol, DuoNeb, 1.5 L bolus of IV normal saline and 30 mg of IV Toradol.  She admitted to a medical telemetry bed for further evaluation and management. PAST MEDICAL HISTORY:   Past Medical History:  Diagnosis Date   Abdominal pain    Anemia    Asthma    Depression    Diarrhea    Heart murmur    Obesity    Plantar fasciitis    Shortness of breath    Vomiting     PAST SURGICAL HISTORY:   Past Surgical History:  Procedure Laterality Date   CHOLECYSTECTOMY  06/19/11   IR RADIOLOGY PERIPHERAL GUIDED IV START  10/23/2018   IR US GUIDE VASC ACCESS RIGHT  10/23/2018   TUBAL LIGATION      SOCIAL HISTORY:   Social History   Tobacco Use   Smoking status: Never   Smokeless tobacco: Never  Substance Use Topics   Alcohol use: No    FAMILY HISTORY:   Family History  Problem Relation Age of Onset   Diabetes Mother 98   Hypertension Mother    Other Mother        sepsis   Emphysema Father 19       smoked   Heart disease Father 70  MI   Emphysema Maternal Aunt        smoked   Esophageal cancer Maternal Aunt    Lung cancer Maternal Aunt        smoked   Heart disease Maternal Grandfather    Heart disease Maternal Aunt    Heart disease Sister 24       MI   Cancer Brother    Cancer Maternal Uncle    Cancer Maternal Grandmother    Heart disease Sister    Aneurysm Brother    Colon cancer Neg Hx    Colon polyps Neg Hx    Rectal cancer Neg Hx    Stomach cancer Neg Hx     DRUG ALLERGIES:  No Known Allergies  REVIEW OF SYSTEMS:   ROS As per history of present illness. All pertinent systems were reviewed above. Constitutional, HEENT, cardiovascular, respiratory, GI, GU, musculoskeletal, neuro, psychiatric, endocrine, integumentary and hematologic systems were reviewed and are otherwise negative/unremarkable except for  positive findings mentioned above in the HPI.   MEDICATIONS AT HOME:   Prior to Admission medications   Medication Sig Start Date End Date Taking? Authorizing Provider  albuterol (PROVENTIL) (2.5 MG/3ML) 0.083% nebulizer solution Take 3 mLs (2.5 mg total) by nebulization every 6 (six) hours as needed for wheezing or shortness of breath. 11/17/21   Tysinger, Camelia Eng, PA-C  albuterol (VENTOLIN HFA) 108 (90 Base) MCG/ACT inhaler Inhale 2 puffs into the lungs every 4 (four) hours as needed for wheezing or shortness of breath. 11/17/21   Tysinger, Camelia Eng, PA-C  fluconazole (DIFLUCAN) 150 MG tablet Take 1 tablet (150 mg total) by mouth once a week. 11/24/21   Tysinger, Camelia Eng, PA-C  Fluticasone-Umeclidin-Vilant (TRELEGY ELLIPTA) 100-62.5-25 MCG/ACT AEPB Inhale 1 puff into the lungs daily. 11/17/21   Tysinger, Camelia Eng, PA-C  Insulin Pen Needle (BD PEN NEEDLE NANO U/F) 32G X 4 MM MISC 1 each by Does not apply route at bedtime. 10/05/20   Tysinger, Camelia Eng, PA-C  pantoprazole (PROTONIX) 40 MG tablet Take 1 tablet (40 mg total) by mouth daily. 10/05/20   Tysinger, Camelia Eng, PA-C  sertraline (ZOLOFT) 25 MG tablet TAKE 1 TABLET(25 MG) BY MOUTH DAILY 12/14/21   Tysinger, Camelia Eng, PA-C      VITAL SIGNS:  Blood pressure (!) 158/87, pulse 94, temperature (!) 101.3 F (38.5 C), temperature source Oral, resp. rate (!) 29, weight 120 kg, SpO2 98 %.  PHYSICAL EXAMINATION:  Physical Exam  GENERAL:  46 y.o.-year-old Afro-American female patient lying in the bed with mild respiratory distress with conversational dyspnea. EYES: Pupils equal, round, reactive to light and accommodation. No scleral icterus. Extraocular muscles intact.  HEENT: Head atraumatic, normocephalic. Oropharynx and nasopharynx clear.  NECK:  Supple, no jugular venous distention. No thyroid enlargement, no tenderness.  LUNGS: Diffuse extremities with tight expiratory airflow and harsh vesicular breathing.  CARDIOVASCULAR: Regular rate and rhythm,  S1, S2 normal. No murmurs, rubs, or gallops.  ABDOMEN: Soft, nondistended, nontender. Bowel sounds present. No organomegaly or mass.  EXTREMITIES: No pedal edema, cyanosis, or clubbing.  NEUROLOGIC: Cranial nerves II through XII are intact. Muscle strength 5/5 in all extremities. Sensation intact. Gait not checked.  PSYCHIATRIC: The patient is alert and oriented x 3.  Normal affect and good eye contact. SKIN: No obvious rash, lesion, or ulcer.   LABORATORY PANEL:   CBC Recent Labs  Lab 12/24/21 1600 12/24/21 1605  WBC 4.8  --   HGB 11.1* 12.2  HCT 37.0 36.0  PLT 185  --    ------------------------------------------------------------------------------------------------------------------  Chemistries  Recent Labs  Lab 12/24/21 1600 12/24/21 1605  NA 136 139  K 3.9 3.9  CL 104 108  CO2 18*  --   GLUCOSE 87 86  BUN 8 7  CREATININE 0.83 0.80  CALCIUM 8.9  --   AST 23  --   ALT 20  --   ALKPHOS 57  --   BILITOT 0.6  --    ------------------------------------------------------------------------------------------------------------------  Cardiac Enzymes No results for input(s): TROPONINI in the last 168 hours. ------------------------------------------------------------------------------------------------------------------  RADIOLOGY:  CT Angio Chest PE W/Cm &/Or Wo Cm  Result Date: 12/24/2021 CLINICAL DATA:  Pulmonary embolism suspected. EXAM: CT ANGIOGRAPHY CHEST WITH CONTRAST TECHNIQUE: Multidetector CT imaging of the chest was performed using the standard protocol during bolus administration of intravenous contrast. Multiplanar CT image reconstructions and MIPs were obtained to evaluate the vascular anatomy. RADIATION DOSE REDUCTION: This exam was performed according to the departmental dose-optimization program which includes automated exposure control, adjustment of the mA and/or kV according to patient size and/or use of iterative reconstruction technique. CONTRAST:   151mL OMNIPAQUE IOHEXOL 350 MG/ML SOLN COMPARISON:  Chest x-ray today FINDINGS: Cardiovascular: Heart size is normal. Trace pericardial effusion is identified, measuring 1 centimeter anteriorly. Thoracic aorta is not aneurysmal. The pulmonary arteries are only moderately well opacified by the contrast bolus. Although large pulmonary emboli are not identified, segmental and subsegmental emboli would be difficult to exclude. Mediastinum/Nodes: No enlarged mediastinal, hilar, or axillary lymph nodes. Thyroid gland, trachea, and esophagus demonstrate no significant findings. Lungs/Pleura: Subsegmental atelectasis identified at the lung bases. There are trace bilateral pleural effusions. Upper Abdomen: Status post cholecystectomy.  No acute abnormality. Musculoskeletal: No chest wall abnormality. No acute or significant osseous findings. Review of the MIP images confirms the above findings. IMPRESSION: 1. Poor opacification of the pulmonary arteries. No large embolism identified. Segmental or subsegmental emboli would be difficult to entirely exclude. 2. Small pericardial effusion. 3. Subsegmental atelectasis at the lung bases. 4. Status post cholecystectomy. Electronically Signed   By: Nolon Nations M.D.   On: 12/24/2021 18:11   DG Chest Portable 1 View  Result Date: 12/24/2021 CLINICAL DATA:  Wheezing, diarrhea EXAM: PORTABLE CHEST 1 VIEW COMPARISON:  11/05/2020 FINDINGS: Low lung volumes. Normal cardiac silhouette. Mild venous congestion pattern. No pulmonary edema. No pneumothorax. IMPRESSION: Low lung volumes and mild venous congestion pattern. Electronically Signed   By: Suzy Bouchard M.D.   On: 12/24/2021 15:57      IMPRESSION AND PLAN:  Assessment and Plan: * Acute asthma exacerbation- (present on admission) - The patient will be admitted to a medical telemetry bed. - This is likely secondary to acute COVID-19 infection. -We will continue steroid therapy with IV Solu-Medrol. - We will continue  bronchodilator therapy with DuoNebs 4 times daily and every 4 hours. - Mucolytic therapy will be provided. - O2 protocol will be followed.    Sepsis due to COVID-19 [facility])- (present on admission) - This is manifested by fever, tachycardia and tachypnea. - It is likely the culprit for #1. - She will be on COVID-19 isolation. - I discussed IV remdesivir with her and she is thinking about it. - We will place her on vitamin C, vitamin D3, zinc sulfate and aspirin. - We will follow inflammatory markers. - We will follow blood cultures.  GERD without esophagitis - We will continue PPI therapy.  Depression, unspecified - We will continue Zoloft.   DVT prophylaxis: Lovenox. [facility]  Planning:  Code Status: full code. Family Communication:  The plan of care was discussed in details with the patient (and family). I answered all questions. The patient agreed to proceed with the above mentioned plan. Further management will depend upon hospital course. Disposition Plan: Back to previous home environment Consults called: none. All the records are reviewed and case discussed with ED provider.  Status is: Inpatient  At the time of the admission, it appears that the appropriate admission status for this patient is inpatient.  This is judged to be reasonable and necessary in order to provide the required intensity of service to ensure the patient's safety given the presenting symptoms, physical exam findings and initial radiographic and laboratory data in the context of comorbid conditions.  The patient requires inpatient status due to high intensity of service, high risk of further deterioration and high frequency of surveillance required.  I certify that at the time of admission, it is my clinical judgment that the patient will require inpatient hospital care extending more than 2 midnights.                            Dispo: The patient is from: Home              Anticipated d/c is to:  Home              Patient currently is not medically stable to d/c.              Difficult to place patient: No  Christel Mormon M.D on 12/24/2021 at 9:46 PM  [facility]   From 7 PM-7 AM, contact night-coverage [email or web address]  CC: Primary care physician; Carlena Hurl, PA-C

## 2021-12-24 NOTE — Assessment & Plan Note (Addendum)
Zoloft

## 2021-12-24 NOTE — Assessment & Plan Note (Addendum)
Protonix.  ?

## 2021-12-24 NOTE — ED Notes (Signed)
Patient is being discharged from the Urgent Care and sent to the Emergency Department via [facility] . Per dr Windy Carina, patient is in need of higher level of care due to tachypneic, asthma exacerbation. Patient is aware and verbalizes understanding of plan of care.  Vitals:   12/24/21 1334 12/24/21 1401  BP:  100/87  Pulse: (!) 126 (!) 117  Resp: (!) 36 (!) 38  Temp:  (!) 101.6 F (38.7 C)  SpO2: 95% 100%

## 2021-12-24 NOTE — ED Provider Notes (Deleted)
Pt presents to triage with wheezing and working hard to breathe since yesterday. Last neb tx was about 3-4 hours ago, and she states did not help much, "because I am sick", meaning she has sore throat and other URI symptoms.  Audibly wheezing and RR 36; poor air movement on exam. Neb tx with albuterol to be given here; if not improving at all or enough, will send to ER   Barrett Henle, MD 12/24/21 1336    AT 1355, she has finished her neb treatment, and is still breathing rapidly and not moving air well. We will send her to ER by ambulance.    Barrett Henle, MD 12/24/21 1357

## 2021-12-24 NOTE — ED Notes (Signed)
This nurse met patient in lobby when asked to evaluate by patient access.  Patient sitting, tachypnea.  Initially resp 38, heart rate 125, and pulse ox 95 while sitting in chair after walking in from parking lot.    After vitals in lobby.  Brought patient to intake room, notified dr Windy Carina. Albuterol treatment ordered.  Respiratory rate did not improve, breath sounds remain decreased and audible wheezing.  Skin warm to touch, dry.  Patient keeps eyes closed, frequent coughing.  Speaking in phrases in between coughing.  Patient has had diarrhea all week.  Patient did do a covid test on Wednesday ([facility]).  Test is negative per patient.  After breathing treatment, noted patient has 101.6 temperature.

## 2021-12-24 NOTE — ED Triage Notes (Signed)
Pt BIB [facility] from [facility] d/t SOB, wheezing, HA diarrhea, sore throat. They report resp of 36, wheezing, A/Ox4.

## 2021-12-24 NOTE — Assessment & Plan Note (Addendum)
Solumedrol and breathing tx

## 2021-12-24 NOTE — Discharge Instructions (Addendum)
Pt will go to ED by ambulance

## 2021-12-24 NOTE — ED Provider Notes (Signed)
[place]    CSN: [id number] Arrival date & time: 12/24/21  1321      History   Chief Complaint Chief Complaint  Patient presents with   Shortness of Breath    HPI [name] is a 46 y.o. female.    Shortness of Breath Pt presents to triage with wheezing and working hard to breathe since yesterday. Last neb tx was about 3-4 hours ago, and she states did not help much, "because I am sick", meaning she has sore throat and other URI symptoms.   Audibly wheezing and RR 36; poor air movement on exam. Neb tx with albuterol to be given here; if not improving at all or enough, will send to ER   Barrett Henle, MD 12/24/21 1336       AT 1355, she has finished her neb treatment, and is still breathing rapidly and not moving air well. We will send her to ER by ambulance.     Barrett Henle, MD 12/24/21 1357    Past Medical History:  Diagnosis Date   Abdominal pain    Anemia    Asthma    Depression    Diarrhea    Heart murmur    Obesity    Plantar fasciitis    Shortness of breath    Vomiting     Patient Active Problem List   Diagnosis Date Noted   Moderate persistent asthma with acute exacerbation 11/17/2021   Other fatigue 11/17/2021   Polydipsia 11/17/2021   Polyuria 11/17/2021   Iron deficiency anemia due to chronic blood loss 10/05/2020   Dysmenorrhea 10/05/2020   Uterine leiomyoma 10/05/2020   Need for influenza vaccination 10/05/2020   BMI 38.0-38.9,adult 10/05/2020   Gastroesophageal reflux disease 10/05/2020   Encounter for health maintenance examination in adult 05/09/2020   Encounter for screening mammogram for malignant neoplasm of breast 05/09/2020   Screen for colon cancer 05/09/2020   Vaccine counseling 05/09/2020   Family history of DVT 03/21/2020   Plantar fasciitis 12/28/2019   Chronic asthma 07/31/2013   Anemia 01/01/2012    Past Surgical History:  Procedure Laterality Date   CHOLECYSTECTOMY  06/19/11   IR  RADIOLOGY PERIPHERAL GUIDED IV START  10/23/2018   IR US GUIDE VASC ACCESS RIGHT  10/23/2018   TUBAL LIGATION      OB History     Gravida  5   Para  4   Term  3   Preterm  1   AB  1   Living  4      SAB  1   IAB      Ectopic      Multiple      Live Births  4            Home Medications    Prior to Admission medications   Medication Sig Start Date End Date Taking? Authorizing Provider  albuterol (PROVENTIL) (2.5 MG/3ML) 0.083% nebulizer solution Take 3 mLs (2.5 mg total) by nebulization every 6 (six) hours as needed for wheezing or shortness of breath. 11/17/21   Tysinger, Camelia Eng, PA-C  albuterol (VENTOLIN HFA) 108 (90 Base) MCG/ACT inhaler Inhale 2 puffs into the lungs every 4 (four) hours as needed for wheezing or shortness of breath. 11/17/21   Tysinger, Camelia Eng, PA-C  fluconazole (DIFLUCAN) 150 MG tablet Take 1 tablet (150 mg total) by mouth once a week. 11/24/21   Tysinger, Camelia Eng, PA-C  Fluticasone-Umeclidin-Vilant (TRELEGY ELLIPTA) 100-62.5-25 MCG/ACT AEPB Inhale  1 puff into the lungs daily. 11/17/21   Tysinger, Camelia Eng, PA-C  Insulin Pen Needle (BD PEN NEEDLE NANO U/F) 32G X 4 MM MISC 1 each by Does not apply route at bedtime. 10/05/20   Tysinger, Camelia Eng, PA-C  pantoprazole (PROTONIX) 40 MG tablet Take 1 tablet (40 mg total) by mouth daily. 10/05/20   Tysinger, Camelia Eng, PA-C  sertraline (ZOLOFT) 25 MG tablet TAKE 1 TABLET(25 MG) BY MOUTH DAILY 12/14/21   Tysinger, Camelia Eng, PA-C    Family History Family History  Problem Relation Age of Onset   Diabetes Mother 32   Hypertension Mother    Other Mother        sepsis   Emphysema Father 14       smoked   Heart disease Father 36       MI   Emphysema Maternal Aunt        smoked   Esophageal cancer Maternal Aunt    Lung cancer Maternal Aunt        smoked   Heart disease Maternal Grandfather    Heart disease Maternal Aunt    Heart disease Sister 38       MI   Cancer Brother    Cancer Maternal Uncle     Cancer Maternal Grandmother    Heart disease Sister    Aneurysm Brother    Colon cancer Neg Hx    Colon polyps Neg Hx    Rectal cancer Neg Hx    Stomach cancer Neg Hx     Social History Social History   Tobacco Use   Smoking status: Never   Smokeless tobacco: Never  Vaping Use   Vaping Use: Never used  Substance Use Topics   Alcohol use: No   Drug use: No     Allergies   Patient has no known allergies.   Review of Systems Review of Systems  Respiratory:  Positive for shortness of breath.     Physical Exam Triage Vital Signs ED Triage Vitals  Enc Vitals Group     BP 12/24/21 1401 100/87     Pulse Rate 12/24/21 1334 (!) 126     Resp 12/24/21 1334 (!) 36     Temp 12/24/21 1401 (!) 101.6 F (38.7 C)     Temp Source 12/24/21 1401 Oral     SpO2 12/24/21 1334 95 %     Weight --      Height --      Head Circumference --      Peak Flow --      Pain Score --      Pain Loc --      Pain Edu? --      Excl. in [place]? --    No data found.  Updated Vital Signs BP 100/87 (BP Location: Right Arm)    Pulse (!) 117    Temp (!) 101.6 F (38.7 C) (Oral)    Resp (!) 38    LMP  (LMP Unknown)    SpO2 100%   Visual Acuity Right Eye Distance:   Left Eye Distance:   Bilateral Distance:    Right Eye Near:   Left Eye Near:    Bilateral Near:     Physical Exam Constitutional:      Comments: In some distress on RA, RR 36  Cardiovascular:     Rate and Rhythm: Normal rate and regular rhythm.  Pulmonary:     Effort: Respiratory distress present.  Breath sounds: Wheezing present. No rhonchi or rales.  Skin:    Coloration: Skin is not jaundiced or pale.  Neurological:     Mental Status: She is alert and oriented to person, place, and time.  Psychiatric:        Behavior: Behavior normal.     UC Treatments / Results  Labs (all labs ordered are listed, but only abnormal results are displayed) Labs Reviewed - No data to display  EKG   Radiology No results  found.  Procedures Procedures (including critical care time)  Medications Ordered in UC Medications  albuterol (PROVENTIL) (2.5 MG/3ML) 0.083% nebulizer solution 2.5 mg (2.5 mg Nebulization Given 12/24/21 1342)    Initial Impression / Assessment and Plan / UC Course  I have reviewed the triage vital signs and the nursing notes.  Pertinent labs & imaging results that were available during my care of the patient were reviewed by me and considered in my medical decision making (see chart for details).     Will send to ED for further eval and tx, since our neb tx here really made no difference Final Clinical Impressions(s) / UC Diagnoses   Final diagnoses:  Exacerbation of asthma, unspecified asthma severity, unspecified whether persistent     Discharge Instructions      Pt will go to ED by ambulance     ED Prescriptions   None    PDMP not reviewed this encounter.   Barrett Henle, MD 12/24/21 [phone]

## 2021-12-24 NOTE — ED Notes (Signed)
[facility] notified of need for pt transport to ED.

## 2021-12-24 NOTE — Assessment & Plan Note (Signed)
-   This likely the culprit for #1. - We will follow inflammatory markers - We will place her on vitamin C, vitamin D3 and zinc sulfate as well as aspirin. - I discussed IV remdesivir for with her and she is thinking about it.

## 2021-12-24 NOTE — ED Notes (Signed)
Patient transported to CT 

## 2021-12-25 LAB — CBC WITH DIFFERENTIAL/PLATELET
Abs Immature Granulocytes: 0.01 10*3/uL (ref 0.00–0.07)
Basophils Absolute: 0 10*3/uL (ref 0.0–0.1)
Basophils Relative: 0 %
Eosinophils Absolute: 0 10*3/uL (ref 0.0–0.5)
Eosinophils Relative: 0 %
HCT: 35.7 % — ABNORMAL LOW (ref 36.0–46.0)
Hemoglobin: 10.6 g/dL — ABNORMAL LOW (ref 12.0–15.0)
Immature Granulocytes: 0 %
Lymphocytes Relative: 19 %
Lymphs Abs: 0.8 10*3/uL (ref 0.7–4.0)
MCH: 22.9 pg — ABNORMAL LOW (ref 26.0–34.0)
MCHC: 29.7 g/dL — ABNORMAL LOW (ref 30.0–36.0)
MCV: 77.3 fL — ABNORMAL LOW (ref 80.0–100.0)
Monocytes Absolute: 0.1 10*3/uL (ref 0.1–1.0)
Monocytes Relative: 1 %
Neutro Abs: 3.4 10*3/uL (ref 1.7–7.7)
Neutrophils Relative %: 80 %
Platelets: 180 10*3/uL (ref 150–400)
RBC: 4.62 MIL/uL (ref 3.87–5.11)
RDW: 13.6 % (ref 11.5–15.5)
WBC: 4.4 10*3/uL (ref 4.0–10.5)
nRBC: 0 % (ref 0.0–0.2)

## 2021-12-25 LAB — FERRITIN
Ferritin: 55 ng/mL (ref 11–307)
Ferritin: 62 ng/mL (ref 11–307)

## 2021-12-25 LAB — BRAIN NATRIURETIC PEPTIDE: B Natriuretic Peptide: 29.9 pg/mL (ref 0.0–100.0)

## 2021-12-25 LAB — PROCALCITONIN: Procalcitonin: <0.1 ng/mL

## 2021-12-25 LAB — LACTIC ACID, PLASMA
Lactic Acid, Venous: 3.6 mmol/L (ref 0.5–1.9)
Lactic Acid, Venous: 2.8 mmol/L (ref 0.5–1.9)
Lactic Acid, Venous: 4.2 mmol/L (ref 0.5–1.9)

## 2021-12-25 LAB — C-REACTIVE PROTEIN
CRP: 1.1 mg/dL — ABNORMAL HIGH (ref <1.0)
CRP: 1.3 mg/dL — ABNORMAL HIGH (ref <1.0)

## 2021-12-25 LAB — HIV ANTIBODY (ROUTINE TESTING W REFLEX): HIV Screen 4th Generation wRfx: NONREACTIVE

## 2021-12-25 LAB — TROPONIN I (HIGH SENSITIVITY)
Troponin I (High Sensitivity): 2 ng/L (ref <18)
Troponin I (High Sensitivity): 3 ng/L (ref <18)

## 2021-12-25 LAB — LACTATE DEHYDROGENASE: LDH: 156 U/L (ref 98–192)

## 2021-12-25 MED ORDER — ALPRAZOLAM 0.25 MG PO TABS
0.5000 mg | ORAL_TABLET | Freq: Two times a day (BID) | ORAL | Status: DC | PRN
  Administered 2021-12-25: 0.5 mg via ORAL
  Filled 2021-12-25: qty 2

## 2021-12-25 MED ORDER — PREDNISONE 10 MG PO TABS: 50.0000 mg | ORAL_TABLET | Freq: Every day | ORAL | 0 refills | Status: AC

## 2021-12-25 NOTE — Progress Notes (Signed)
Progress Note   Patient: [name] MWU:[id number] DOB: August 10, 1976 DOA: 12/24/2021     1 DOS: the patient was seen and examined on 12/25/2021   Brief hospital course: [name] is a 46 y.o. African-American female with medical history significant for asthma and obesity, who presented to the emergency room with acute onset of worsening dyspnea and associated cough since Monday as well as sore throat, myalgia and diarrhea.  She has been having associated wheezing.  She has been using her albuterol inhaler every 2-3 hours without much help.  She was seen in urgent care before being sent to the ER for lacking good air movement.  She had a helper at work sick visit on Tuesday and was negative for COVID and flu.  She admits to tactile fever and was febrile in the ER.  She admits to having multiple sick contacts or positive for COVID-19 in her family.  She denies any sore throat or rhinorrhea or nasal congestion.  No chest pain no palpitations.  No nausea or vomiting.  No loss of taste or smell.  No dysuria, oliguria, urinary frequency or urgency or flank pain.  She had 2 COVID vaccines and 2 boosters.  Patient tested positive for COVID-19 and was started on Remdesivir and Solu-Medrol.  Assessment and Plan: * Sepsis due to COVID-19 [facility])- (present on admission) -Present on admission with fever, tachycardia and tachypnea -Remains on room air   COVID-19 Pneumonia -Tested positive on 2/26 -Continue steroids (due to asthma) -Patient was hesitant to start remdesivir on admission  -Supportive treatments as ordered, encourage IS, encourage prone positioning, encourage mobilization   COVID-19 Labs  Recent Labs    12/24/21 2330 12/25/21 0511  FERRITIN 62 55  LDH 156  --   CRP 1.3* 1.1*    Lab Results  Component Value Date   SARSCOV2NAA POSITIVE (A) 12/24/2021   [place] NEGATIVE 11/05/2020   [place] Not Detected 07/29/2019   [place] Not Detected 07/18/2019      Depression, unspecified Zoloft   GERD without esophagitis Protonix   Acute asthma exacerbation- (present on admission) Solumedrol and breathing tx          Subjective: Restless today, has not been able to sleep at all since being in the emergency department.  Physical Exam: Vitals:   12/25/21 0930 12/25/21 0945 12/25/21 1000 12/25/21 1015  BP: (!) 161/79 131/79 122/72 131/88  Pulse: 90 92 83 84  Resp: (!) 26 (!) 34 (!) 23 (!) 23  Temp:      TempSrc:      SpO2: 99% 95% 98% 98%  Weight:       Examination: General exam: Appears anxious Respiratory system: Diminished breath sounds, respiratory effort is normal on room air, with conversational dyspnea and desats to 70 during conversation Cardiovascular system: S1 & S2 heard, RRR. No pedal edema. Gastrointestinal system: Abdomen is nondistended, soft and nontender. Normal bowel sounds heard. Central nervous system: Alert and oriented. Non focal exam. Speech clear  Extremities: Symmetric in appearance bilaterally  Skin: No rashes, lesions or ulcers on exposed skin  Psychiatry: Judgement and insight appear stable  Data Reviewed:  Troponin negative at 3, 2.  Ferritin trending downward 55, CRP trending downward 1.1, lactic acid 3.6, procalcitonin negative, WBC 4.4  Family Communication: No family at bedside  Disposition: Status is: Inpatient Remains inpatient appropriate because: Patient with conversational dyspnea, remains on solumedrol           Planned Discharge Destination: Home  Author: Dessa Phi, DO 12/25/2021 12:19 PM  For on call review [email or web address].

## 2021-12-25 NOTE — ED Notes (Signed)
Breakfast order placed ?

## 2021-12-25 NOTE — ED Notes (Signed)
Discussed most recent lactic acid results with admitting doctor.  Doesn't seem to correlate with pt's clinical presentation.  Pt has been deemed stable enough to go home.

## 2021-12-25 NOTE — ED Notes (Signed)
Pt noted to have leads off. When I checked she had gotten up to have a bowel movement and was cleaning up.

## 2021-12-25 NOTE — Discharge Summary (Signed)
Physician Discharge Summary   Patient: [name] MRN: [id number] DOB: [birth year]  Admit date:     12/24/2021  Discharge date: 12/25/21  Discharge Physician: Dessa Phi   PCP: Carlena Hurl, PA-C   Recommendations at discharge:    Follow up with PCP   Discharge Diagnoses: Principal Problem:   Sepsis due to COVID-19 [facility]) Active Problems:   Acute asthma exacerbation   GERD without esophagitis   Depression, unspecified  Resolved Problems:   * No resolved hospital problems. *   Hospital Course: [name] is a 46 y.o. African-American female with medical history significant for asthma and obesity, who presented to the emergency room with acute onset of worsening dyspnea and associated cough since Monday as well as sore throat, myalgia and diarrhea.  She has been having associated wheezing.  She has been using her albuterol inhaler every 2-3 hours without much help.  She was seen in urgent care before being sent to the ER for lacking good air movement.  She had a helper at work sick visit on Tuesday and was negative for COVID and flu.  She admits to tactile fever and was febrile in the ER.  She admits to having multiple sick contacts or positive for COVID-19 in her family.  She denies any sore throat or rhinorrhea or nasal congestion.  No chest pain no palpitations.  No nausea or vomiting.  No loss of taste or smell.  No dysuria, oliguria, urinary frequency or urgency or flank pain.  She had 2 COVID vaccines and 2 boosters.  Patient tested positive for COVID-19 and was started on Solu-Medrol due to her underlying asthma. Her vital signs and lactic acid improved. On 2/27, she requested to be discharged home as she was feeling better.   Assessment and Plan: * Sepsis due to COVID-19 [facility])- (present on admission) -Present on admission with fever, tachycardia and tachypnea -Remains on room air   COVID-19 Pneumonia -Tested positive on 2/26 -Continue steroids (due to  asthma) -Patient was hesitant to start remdesivir on admission  -Supportive treatments as ordered, encourage IS, encourage prone positioning, encourage mobilization   COVID-19 Labs  Recent Labs    12/24/21 2330 12/25/21 0511  FERRITIN 62 55  LDH 156  --   CRP 1.3* 1.1*    Lab Results  Component Value Date   SARSCOV2NAA POSITIVE (A) 12/24/2021   [place] NEGATIVE 11/05/2020   [place] Not Detected 07/29/2019   [place] Not Detected 07/18/2019     Depression, unspecified Zoloft   GERD without esophagitis Protonix   Acute asthma exacerbation- (present on admission) Solumedrol and breathing tx             Consultants: None Procedures performed: None  Disposition: Home Diet recommendation:  Regular diet  DISCHARGE MEDICATION: Allergies as of 12/25/2021   No Known Allergies      Medication List     TAKE these medications    albuterol 108 (90 Base) MCG/ACT inhaler Commonly known as: VENTOLIN HFA Inhale 2 puffs into the lungs every 4 (four) hours as needed for wheezing or shortness of breath.   albuterol (2.5 MG/3ML) 0.083% nebulizer solution Commonly known as: PROVENTIL Take 3 mLs (2.5 mg total) by nebulization every 6 (six) hours as needed for wheezing or shortness of breath.   BD Pen Needle Nano U/F 32G X 4 MM Misc Generic drug: Insulin Pen Needle 1 each by Does not apply route at bedtime.   pantoprazole 40 MG tablet Commonly known as: PROTONIX  Take 1 tablet (40 mg total) by mouth daily.   predniSONE 10 MG tablet Commonly known as: DELTASONE Take 5 tablets (50 mg total) by mouth daily with breakfast for 4 days.   sertraline 25 MG tablet Commonly known as: ZOLOFT TAKE 1 TABLET(25 MG) BY MOUTH DAILY What changed: See the new instructions.   Trelegy Ellipta 100-62.5-25 MCG/ACT Aepb Generic drug: Fluticasone-Umeclidin-Vilant Inhale 1 puff into the lungs daily.        Follow-up Information     Tysinger, Camelia Eng, PA-C Follow  up.   Specialty: Family Medicine Contact information: [place] [address] [phone]         Elouise Munroe, MD .   Specialties: Cardiology, Radiology Contact information: [address] [place] [address] [phone]                 Discharge Exam: Danley Danker Weights   12/24/21 2100  Weight: 120 kg     Condition at discharge: stable  The results of significant diagnostics from this hospitalization (including imaging, microbiology, ancillary and laboratory) are listed below for reference.   Imaging Studies: CT Angio Chest PE W/Cm &/Or Wo Cm  Result Date: 12/24/2021 CLINICAL DATA:  Pulmonary embolism suspected. EXAM: CT ANGIOGRAPHY CHEST WITH CONTRAST TECHNIQUE: Multidetector CT imaging of the chest was performed using the standard protocol during bolus administration of intravenous contrast. Multiplanar CT image reconstructions and MIPs were obtained to evaluate the vascular anatomy. RADIATION DOSE REDUCTION: This exam was performed according to the departmental dose-optimization program which includes automated exposure control, adjustment of the mA and/or kV according to patient size and/or use of iterative reconstruction technique. CONTRAST:  183mL OMNIPAQUE IOHEXOL 350 MG/ML SOLN COMPARISON:  Chest x-ray today FINDINGS: Cardiovascular: Heart size is normal. Trace pericardial effusion is identified, measuring 1 centimeter anteriorly. Thoracic aorta is not aneurysmal. The pulmonary arteries are only moderately well opacified by the contrast bolus. Although large pulmonary emboli are not identified, segmental and subsegmental emboli would be difficult to exclude. Mediastinum/Nodes: No enlarged mediastinal, hilar, or axillary lymph nodes. Thyroid gland, trachea, and esophagus demonstrate no significant findings. Lungs/Pleura: Subsegmental atelectasis identified at the lung bases. There are trace bilateral pleural effusions. Upper Abdomen:  Status post cholecystectomy.  No acute abnormality. Musculoskeletal: No chest wall abnormality. No acute or significant osseous findings. Review of the MIP images confirms the above findings. IMPRESSION: 1. Poor opacification of the pulmonary arteries. No large embolism identified. Segmental or subsegmental emboli would be difficult to entirely exclude. 2. Small pericardial effusion. 3. Subsegmental atelectasis at the lung bases. 4. Status post cholecystectomy. Electronically Signed   By: Nolon Nations M.D.   On: 12/24/2021 18:11   DG Chest Portable 1 View  Result Date: 12/24/2021 CLINICAL DATA:  Wheezing, diarrhea EXAM: PORTABLE CHEST 1 VIEW COMPARISON:  11/05/2020 FINDINGS: Low lung volumes. Normal cardiac silhouette. Mild venous congestion pattern. No pulmonary edema. No pneumothorax. IMPRESSION: Low lung volumes and mild venous congestion pattern. Electronically Signed   By: Suzy Bouchard M.D.   On: 12/24/2021 15:57    Microbiology: Results for orders placed or performed during the hospital encounter of 12/24/21  Resp Panel by RT-PCR (Flu A&B, Covid) Nasopharyngeal Swab     Status: Abnormal   Collection Time: 12/24/21  3:05 PM   Specimen: Nasopharyngeal Swab; Nasopharyngeal(NP) swabs in vial transport medium  Result Value Ref Range Status   SARS Coronavirus 2 by RT PCR POSITIVE (A) NEGATIVE Final    Comment: (NOTE) SARS-CoV-2 target nucleic  acids are DETECTED.  The SARS-CoV-2 RNA is generally detectable in upper respiratory specimens during the acute phase of infection. Positive results are indicative of the presence of the identified virus, but do not rule out bacterial infection or co-infection with other pathogens not detected by the test. Clinical correlation with patient history and other diagnostic information is necessary to determine patient infection status. The expected result is Negative.  Fact Sheet for Patients: EntrepreneurPulse.com.au  Fact Sheet  for Healthcare Providers: IncredibleEmployment.be  This test is not yet approved or cleared by the [place] FDA and  has been authorized for detection and/or diagnosis of SARS-CoV-2 by FDA under an Emergency Use Authorization (EUA).  This EUA will remain in effect (meaning this test can be used) for the duration of  the COVID-19 declaration under Section 564(b)(1) of the A ct, 21 U.S.C. section 360bbb-3(b)(1), unless the authorization is terminated or revoked sooner.     Influenza A by PCR NEGATIVE NEGATIVE Final   Influenza B by PCR NEGATIVE NEGATIVE Final    Comment: (NOTE) The Xpert Xpress SARS-CoV-2/FLU/RSV plus assay is intended as an aid in the diagnosis of influenza from Nasopharyngeal swab specimens and should not be used as a sole basis for treatment. Nasal washings and aspirates are unacceptable for Xpert Xpress SARS-CoV-2/FLU/RSV testing.  Fact Sheet for Patients: EntrepreneurPulse.com.au  Fact Sheet for Healthcare Providers: IncredibleEmployment.be  This test is not yet approved or cleared by the [place] FDA and has been authorized for detection and/or diagnosis of SARS-CoV-2 by FDA under an Emergency Use Authorization (EUA). This EUA will remain in effect (meaning this test can be used) for the duration of the COVID-19 declaration under Section 564(b)(1) of the Act, 21 U.S.C. section 360bbb-3(b)(1), unless the authorization is terminated or revoked.  Performed at [facility], [place] [address] [place], [place] [address]   Group A Strep by PCR     Status: None   Collection Time: 12/24/21  3:05 PM   Specimen: Nasopharyngeal Swab; Sterile Swab  Result Value Ref Range Status   Group A Strep by PCR NOT DETECTED NOT DETECTED Final    Comment: Performed at [facility], [place] [address] [place], [place] [address]    Labs: CBC: Recent Labs  Lab 12/24/21 1600 12/24/21 1605 12/25/21 0221   WBC 4.8  --  4.4  NEUTROABS 2.7  --  3.4  HGB 11.1* 12.2 10.6*  HCT 37.0 36.0 35.7*  MCV 77.6*  --  77.3*  PLT 185  --  102   Basic Metabolic Panel: Recent Labs  Lab 12/24/21 1600 12/24/21 1605  NA 136 139  K 3.9 3.9  CL 104 108  CO2 18*  --   GLUCOSE 87 86  BUN 8 7  CREATININE 0.83 0.80  CALCIUM 8.9  --    Liver Function Tests: Recent Labs  Lab 12/24/21 1600  AST 23  ALT 20  ALKPHOS 57  BILITOT 0.6  PROT 7.4  ALBUMIN 3.6   CBG: No results for input(s): GLUCAP in the last 168 hours.  Discharge time spent: less than 30 minutes.  Signed: Dessa Phi, DO [facility] 12/25/2021

## 2021-12-25 NOTE — Discharge Instructions (Signed)
You tested positive for COVID-19 on 12/24/21. Per CDC guidelines, please quarantine yourself for 5 days starting 12/25/21. You may return to normal activities on 12/30/21 as long as you have been fever-free and symptoms improving.

## 2021-12-25 NOTE — ED Notes (Signed)
DC instructions reviewed with pt. Pt verbalized understanding.  PT DC.  

## 2021-12-25 NOTE — Hospital Course (Addendum)
[name] is a 46 y.o. African-American female with medical history significant for asthma and obesity, who presented to the emergency room with acute onset of worsening dyspnea and associated cough since Monday as well as sore throat, myalgia and diarrhea.  She has been having associated wheezing.  She has been using her albuterol inhaler every 2-3 hours without much help.  She was seen in urgent care before being sent to the ER for lacking good air movement.  She had a helper at work sick visit on Tuesday and was negative for COVID and flu.  She admits to tactile fever and was febrile in the ER.  She admits to having multiple sick contacts or positive for COVID-19 in her family.  She denies any sore throat or rhinorrhea or nasal congestion.  No chest pain no palpitations.  No nausea or vomiting.  No loss of taste or smell.  No dysuria, oliguria, urinary frequency or urgency or flank pain.  She had 2 COVID vaccines and 2 boosters.  Patient tested positive for COVID-19 and was started on Solu-Medrol due to her underlying asthma. Her vital signs and lactic acid improved. On 2/27, she requested to be discharged home as she was feeling better.

## 2021-12-26 ENCOUNTER — Telehealth: Payer: Self-pay

## 2021-12-26 NOTE — Telephone Encounter (Signed)
I called pt. Per TOC report she was recently in the hospital with covid and asthma, called LM to call back to schedule f/u virtually if needed and to see how she is doing.

## 2021-12-26 NOTE — Telephone Encounter (Signed)
Pt. Back stating she is not doing that well today, still feeling bad and has so much congestion. She did schedule a virtual f/u with you tomorrow.

## 2021-12-27 ENCOUNTER — Encounter: Payer: Self-pay | Admitting: Internal Medicine

## 2021-12-27 ENCOUNTER — Telehealth: Payer: 59 | Admitting: Medical

## 2021-12-27 ENCOUNTER — Other Ambulatory Visit: Payer: Self-pay

## 2021-12-27 VITALS — Wt 240.0 lb

## 2021-12-27 DIAGNOSIS — R451 Restlessness and agitation: Secondary | ICD-10-CM | POA: Diagnosis not present

## 2021-12-27 DIAGNOSIS — R0600 Dyspnea, unspecified: Secondary | ICD-10-CM

## 2021-12-27 DIAGNOSIS — U071 COVID-19: Secondary | ICD-10-CM

## 2021-12-27 MED ORDER — PROMETHAZINE-DM 6.25-15 MG/5ML PO SYRP
5.0000 mL | ORAL_SOLUTION | Freq: Four times a day (QID) | ORAL | 0 refills | Status: DC | PRN
Start: 2021-12-27 — End: 2022-06-15

## 2021-12-27 MED ORDER — CLONAZEPAM 0.5 MG PO TABS: 0.5000 mg | ORAL_TABLET | Freq: Two times a day (BID) | ORAL | 0 refills | Status: DC | PRN

## 2021-12-27 MED ORDER — MOLNUPIRAVIR EUA 200MG CAPSULE: 4.0000 | ORAL_CAPSULE | Freq: Two times a day (BID) | ORAL | 0 refills | Status: AC

## 2021-12-27 NOTE — Progress Notes (Signed)
?Subjective:  ?  ? Patient ID: [name], female   DOB: 07/06/1976, 46 y.o.   MRN: [id number] ? ?This visit type was conducted due to national recommendations for restrictions regarding the COVID-19 Pandemic (e.g. social distancing) in an effort to limit this patient's exposure and mitigate transmission in our community.  Due to their co-morbid illnesses, this patient is at least at moderate risk for complications without adequate follow up.  This format is felt to be most appropriate for this patient at this time.   ? ?Documentation for virtual audio and video telecommunications through [place] encounter: ? ?The patient was located at home. ?The provider was located in the office. ?The patient did consent to this visit and is aware of possible charges through their insurance for this visit. ? ?The other persons participating in this telemedicine service were none. ?Time spent on call was 20 minutes and in review of previous records 20 minutes total. ? ?This virtual service is not related to other E/M service within previous 7 days. ? ? ?HPI ?Chief Complaint  ?Patient presents with  ? Hospital follow-up on covid  ?  F/u on covid. Still feeling bad.   ? ?Virtual consult for illness.  She was hospitalized February 26 through 27, 2023 ? ?Symptoms started about 4 days ago with cough, shortness of breath.  Has felt panicky to lie down.  She received Solu-Medrol steroid treatment at the hospital and breathing treatments.  She was discharged home on albuterol nebulizer and handheld.  She is using this every few hours.  She still is on Trelegy.  She has underlying history of asthma ? ?Since being discharged the hospital she is having trouble sleeping, feels restless all the time particularly if having shortness of breath or lying flat.  She would like something for cough and help calm her nerves.  Her cough treatment right now is over-the-counter Robitussin which is not helping. ? ?She feels somewhat fatigued.   She is trying to hydrate well. ? ?No fever in past few days. No vomiting.  Having some nausea.  As needed now that much.  She had diarrhea all last week prior to the symptoms of cough and respiratory symptoms. ? ?No other aggravating or relieving factors. No other complaint. ? ? ?Past Medical History:  ?Diagnosis Date  ? Abdominal pain   ? Anemia   ? Asthma   ? Depression   ? Diarrhea   ? Heart murmur   ? Obesity   ? Plantar fasciitis   ? Shortness of breath   ? Vomiting   ? ? ?Current Outpatient Medications on File Prior to Visit  ?Medication Sig Dispense Refill  ? albuterol (PROVENTIL) (2.5 MG/3ML) 0.083% nebulizer solution Take 3 mLs (2.5 mg total) by nebulization every 6 (six) hours as needed for wheezing or shortness of breath. 75 mL 1  ? albuterol (VENTOLIN HFA) 108 (90 Base) MCG/ACT inhaler Inhale 2 puffs into the lungs every 4 (four) hours as needed for wheezing or shortness of breath. 18 g 1  ? Fluticasone-Umeclidin-Vilant (TRELEGY ELLIPTA) 100-62.5-25 MCG/ACT AEPB Inhale 1 puff into the lungs daily. 1 each 5  ? Insulin Pen Needle (BD PEN NEEDLE NANO U/F) 32G X 4 MM MISC 1 each by Does not apply route at bedtime. 30 each 2  ? pantoprazole (PROTONIX) 40 MG tablet Take 1 tablet (40 mg total) by mouth daily. 90 tablet 3  ? sertraline (ZOLOFT) 25 MG tablet TAKE 1 TABLET(25 MG) BY MOUTH DAILY (Patient taking differently:  Take 25 mg by mouth daily.) 90 tablet 0  ? predniSONE (DELTASONE) 10 MG tablet Take 5 tablets (50 mg total) by mouth daily with breakfast for 4 days. (Patient not taking: Reported on 12/27/2021) 20 tablet 0  ? ?No current facility-administered medications on file prior to visit.  ? ? ?Review of Systems ?As in subjective ?   ?Objective:  ? Physical Exam ?Due to coronavirus pandemic stay at home measures, patient visit was virtual and they were not examined in person.   ?Wt 240 lb (108.9 kg)   LMP  (LMP Unknown)   BMI 34.44 kg/m?  ? ?Wt Readings from Last 3 Encounters:  ?12/27/21 240 lb (108.9 kg)   ?12/24/21 264 lb 8.8 oz (120 kg)  ?11/17/21 258 lb 9.6 oz (117.3 kg)  ? ?General: Seated on the couch, no labored breathing or wheezing, still ill-appearing ?Talks in  complete sentences ? ?   ?Assessment:  ?   ?Encounter Diagnoses  ?Name Primary?  ? COVID-19 Yes  ? Dyspnea, unspecified type   ? Restless   ? ? ?   ?Plan:  ?   ?I reviewed her hospital records from recent hospitalization.  I reviewed the recent chest CT that was abnormal as well as labs. ? ?She is agreeable to molnupiravir which I think is still be helpful at this point.  Begin medication.  I prescribed Promethazine DM for cough.  Caution on sedation.  I prescribed Klonopin to help with restlessness and anxiety over the situation.  Can use as needed.  Discussed risk and benefits and proper use of medications. ? ?Continue to hydrate well throughout the day, continue to rest.  Use nebulizer therapy as needed.  Discussed that she can feel jittery after using this.  Advise she do this in a well ventilated area or go outside on the porch to do the albuterol nebulized therapy as COVID can spread through the droplets. ? ?Small pericardial effusion noted on CT 12/24/21 - follow up with cardiology ? ?If not significant improvements over the next 3 to 4 days then call or recheck.  If much worse go to the hospital. ? ? ?[name] was seen today for hospital follow-up on covid. ? ?Diagnoses and all orders for this visit: ? ?COVID-19 ? ?Dyspnea, unspecified type ? ?Restless ? ?Other orders ?-     promethazine-dextromethorphan (PROMETHAZINE-DM) 6.25-15 MG/5ML syrup; Take 5 mLs by mouth 4 (four) times daily as needed for cough. ?-     clonazePAM (KLONOPIN) 0.5 MG tablet; Take 1 tablet (0.5 mg total) by mouth 2 (two) times daily as needed for anxiety. ?-     molnupiravir EUA (LAGEVRIO) 200 mg CAPS capsule; Take 4 capsules (800 mg total) by mouth 2 (two) times daily for 5 days. ? ?F/u 1 wk ? ?

## 2021-12-29 ENCOUNTER — Telehealth: Payer: Self-pay | Admitting: Internal Medicine

## 2021-12-29 LAB — CULTURE, BLOOD (ROUTINE X 2)
Culture: NO GROWTH
Culture: NO GROWTH

## 2021-12-29 NOTE — Telephone Encounter (Signed)
FMLA forms- patients missed work starting February 21st when her symptoms started. When to ER on February 26th for dx of covid. Still not feeling well so she has not been back to work yet. Constant diarrhea and thrown up a couple times and not eating. Trying to drink as much as she can't but just very weak. Advised patient that she may be dehyrated and that she may need IV fluids if she keeps having diarrhea and vomitting spells. She just started drinking adult pediayle about an hour ago so she will see if that helps.  ?

## 2022-01-01 NOTE — Telephone Encounter (Signed)
Pt will come in tomorrow.

## 2022-01-01 NOTE — Telephone Encounter (Signed)
Pt states she might be 60% better but is very SOB. She can't catch her breathe good. Diarrhea and vomiting subsided yesterday and still weak. She still have a cough as well ?

## 2022-01-02 ENCOUNTER — Other Ambulatory Visit: Payer: 59

## 2022-01-02 ENCOUNTER — Other Ambulatory Visit: Payer: Self-pay

## 2022-01-02 ENCOUNTER — Ambulatory Visit (HOSPITAL_COMMUNITY)
Admission: RE | Admit: 2022-01-02 | Discharge: 2022-01-02 | Disposition: A | Discharge status: Home / Self Care | Payer: 59 | Source: Ambulatory Visit | Attending: Medical | Admitting: Medical

## 2022-01-02 ENCOUNTER — Ambulatory Visit: Payer: 59 | Admitting: Medical

## 2022-01-02 VITALS — BP 110/70 | HR 90 | Temp 97.5°F | Resp 20 | Wt 255.2 lb

## 2022-01-02 DIAGNOSIS — R0602 Shortness of breath: Secondary | ICD-10-CM | POA: Diagnosis not present

## 2022-01-02 DIAGNOSIS — R062 Wheezing: Secondary | ICD-10-CM

## 2022-01-02 DIAGNOSIS — Z8616 Personal history of COVID-19: Secondary | ICD-10-CM | POA: Diagnosis not present

## 2022-01-02 LAB — CBC WITH DIFFERENTIAL/PLATELET
Basophils Absolute: 0 10*3/uL (ref 0.0–0.2)
Basos: 0 %
EOS (ABSOLUTE): 0.1 10*3/uL (ref 0.0–0.4)
Eos: 2 %
Hematocrit: 34 % (ref 34.0–46.6)
Hemoglobin: 10.9 g/dL — ABNORMAL LOW (ref 11.1–15.9)
Immature Grans (Abs): 0 10*3/uL (ref 0.0–0.1)
Immature Granulocytes: 0 %
Lymphocytes Absolute: 2.4 10*3/uL (ref 0.7–3.1)
Lymphs: 35 %
MCH: 23.6 pg — ABNORMAL LOW (ref 26.6–33.0)
MCHC: 32.1 g/dL (ref 31.5–35.7)
MCV: 74 fL — ABNORMAL LOW (ref 79–97)
Monocytes Absolute: 0.4 10*3/uL (ref 0.1–0.9)
Monocytes: 6 %
Neutrophils Absolute: 3.9 10*3/uL (ref 1.4–7.0)
Neutrophils: 57 %
Platelets: 228 10*3/uL (ref 150–450)
RBC: 4.62 x10E6/uL (ref 3.77–5.28)
RDW: 13.5 % (ref 11.7–15.4)
WBC: 6.9 10*3/uL (ref 3.4–10.8)

## 2022-01-02 LAB — BASIC METABOLIC PANEL WITH GFR
BUN/Creatinine Ratio: 11 (ref 9–23)
BUN: 8 mg/dL (ref 6–24)
CO2: 21 mmol/L (ref 20–29)
Calcium: 9.4 mg/dL (ref 8.7–10.2)
Chloride: 106 mmol/L (ref 96–106)
Creatinine, Ser: 0.74 mg/dL (ref 0.57–1.00)
Glucose: 93 mg/dL (ref 70–99)
Potassium: 4 mmol/L (ref 3.5–5.2)
Sodium: 140 mmol/L (ref 134–144)
eGFR: 101 mL/min/{1.73_m2}

## 2022-01-02 LAB — D-DIMER, QUANTITATIVE: D-DIMER: 1.13 mg/L FEU — ABNORMAL HIGH (ref 0.00–0.49)

## 2022-01-02 LAB — SPECIMEN STATUS REPORT

## 2022-01-02 LAB — SEDIMENTATION RATE: Sed Rate: 91 mm/h — ABNORMAL HIGH (ref 0–32)

## 2022-01-02 NOTE — Progress Notes (Signed)
Subjective: ?Chief Complaint  ?Patient presents with  ? Covid Positive  ?  Follow-up on covid. She is on 8-9 days of covid. Coughing.   ? ?Here today at my request for follow-up on shortness of breath.  She started getting sick about 13 days ago.  She was initially seen at the hospital emergency department December 24, 2021 diagnosed with COVID and asthma exacerbation.  She was given steroid shots started on nebulizer therapy and was continued on Trelegy.  We did a video visit on December 27, 2021, and at that time we added molnupiravir and promethazine cough medicine. ? ?She initially had a variety of symptoms including diarrhea.  The diarrhea has resolved ? ?Her main concern is ongoing shortness of breath, fatigue and cough.  She is using the Trelegy regularly, nebulizer therapy every few hours, finished the molnupiravir, still using Promethazine DM for cough ? ?She notes that she had a similar flareup of asthma with an illness a few years ago where she states short of breath and ended up seeing cardiology for evaluation.  Symptoms eventually resolved but this illness seems similar ? ?No history of blood clots. ? ?She denies history of bad allergy problems in the spring. ? ?No other aggravating or relieving factors. No other complaint. ? ?Past Medical History:  ?Diagnosis Date  ? Abdominal pain   ? Anemia   ? Asthma   ? Depression   ? Diarrhea   ? Heart murmur   ? Obesity   ? Plantar fasciitis   ? Shortness of breath   ? Vomiting   ? ?Current Outpatient Medications on File Prior to Visit  ?Medication Sig Dispense Refill  ? albuterol (PROVENTIL) (2.5 MG/3ML) 0.083% nebulizer solution Take 3 mLs (2.5 mg total) by nebulization every 6 (six) hours as needed for wheezing or shortness of breath. 75 mL 1  ? albuterol (VENTOLIN HFA) 108 (90 Base) MCG/ACT inhaler Inhale 2 puffs into the lungs every 4 (four) hours as needed for wheezing or shortness of breath. 18 g 1  ? clonazePAM (KLONOPIN) 0.5 MG tablet Take 1 tablet (0.5 mg  total) by mouth 2 (two) times daily as needed for anxiety. 20 tablet 0  ? Fluticasone-Umeclidin-Vilant (TRELEGY ELLIPTA) 100-62.5-25 MCG/ACT AEPB Inhale 1 puff into the lungs daily. 1 each 5  ? pantoprazole (PROTONIX) 40 MG tablet Take 1 tablet (40 mg total) by mouth daily. 90 tablet 3  ? promethazine-dextromethorphan (PROMETHAZINE-DM) 6.25-15 MG/5ML syrup Take 5 mLs by mouth 4 (four) times daily as needed for cough. 120 mL 0  ? sertraline (ZOLOFT) 25 MG tablet TAKE 1 TABLET(25 MG) BY MOUTH DAILY (Patient taking differently: Take 25 mg by mouth daily.) 90 tablet 0  ? Insulin Pen Needle (BD PEN NEEDLE NANO U/F) 32G X 4 MM MISC 1 each by Does not apply route at bedtime. 30 each 2  ? ?No current facility-administered medications on file prior to visit.  ? ? ?ROS as in subjective ? ? ? ?Objective: ?BP 110/70   Pulse 90   Temp (!) 97.5 ?F (36.4 ?C)   Resp 20   Wt 255 lb 3.2 oz (115.8 kg)   LMP  (LMP Unknown)   SpO2 99%   BMI 36.62 kg/m?  ? ?Wt Readings from Last 3 Encounters:  ?01/02/22 255 lb 3.2 oz (115.8 kg)  ?12/27/21 240 lb (108.9 kg)  ?12/24/21 264 lb 8.8 oz (120 kg)  ? ?General appearence: alert, no distress, WD/WN, fatigued appearing ?HEENT: normocephalic, sclerae anicteric, TMs pearly, nares patent,  no discharge or erythema, pharynx normal ?Oral cavity: MMM, no lesions ?Neck: supple, no lymphadenopathy, no thyromegaly, no masses, no JVD or bruits ?Heart: RRR, normal S1, S2, no murmurs ?Lungs: Somewhat quieter breath sounds with overall relatively clear, + faint wheezes, rhonchi, or rales ?Pulses: 2+ symmetric, upper and lower extremities, normal cap refill ?Calves bilaterally slightly tender, left calf slightly larger than the right asymmetrically, no palpable cord, negative Homans ? ? ?EKG ?Indication SOB, rate 80 bpm, PR 128 ms, QRS 81m, QTC 4421m axis 22 degrees, baseline fluctation with coughing in I and II, otherwise no acute changes. ? ? ?Chest CT angio 12/24/21 ?IMPRESSION: ?1. Poor opacification  of the pulmonary arteries. No large embolism ?identified. Segmental or subsegmental emboli would be difficult to ?entirely exclude. ?2. Small pericardial effusion. ?3. Subsegmental atelectasis at the lung bases. ?4. Status post cholecystectomy. ?  ? ? ?Assessment: ?Encounter Diagnoses  ?Name Primary?  ? SOB (shortness of breath) Yes  ? Wheezing   ? History of COVID-19   ? ? ?Plan: ?Given her ongoing symptoms, evaluation orders as below, labs, x-ray, EKG.   Differential includes asthma exacerbation, pneumonia, PE, other cardiopulmonary issue.  I suspect asthma exacerbation not resolved.   ? ?She had a CT chest on 12/24/2021 which was reviewed as above ? ?Continue Trelegy, continue albuterol nebs throughout the day as needed, rest, hydrate well. ? ? ?[place] seen today for covid positive. ? ?Diagnoses and all orders for this visit: ? ?SOB (shortness of breath) ?-     DG Chest 2 View; Future ?-     CBC with Differential/Platelet ?-     D-dimer, quantitative ?-     Basic metabolic panel ?-     Sedimentation rate ?-     EKG 12-Lead ? ?Wheezing ?-     DG Chest 2 View; Future ?-     CBC with Differential/Platelet ?-     D-dimer, quantitative ?-     Basic metabolic panel ?-     Sedimentation rate ?-     EKG 12-Lead ? ?History of COVID-19 ?-     DG Chest 2 View; Future ?-     CBC with Differential/Platelet ?-     D-dimer, quantitative ?-     Basic metabolic panel ?-     Sedimentation rate ?-     EKG 12-Lead ? ? ?F/u pending labs and xray ? ? ? ? ?

## 2022-01-03 ENCOUNTER — Other Ambulatory Visit: Payer: Self-pay | Admitting: Medical

## 2022-01-03 ENCOUNTER — Ambulatory Visit (HOSPITAL_BASED_OUTPATIENT_CLINIC_OR_DEPARTMENT_OTHER)
Admission: RE | Admit: 2022-01-03 | Discharge: 2022-01-03 | Disposition: A | Discharge status: Home / Self Care | Payer: 59 | Source: Ambulatory Visit | Attending: Medical | Admitting: Medical

## 2022-01-03 ENCOUNTER — Encounter (HOSPITAL_BASED_OUTPATIENT_CLINIC_OR_DEPARTMENT_OTHER): Payer: Self-pay

## 2022-01-03 DIAGNOSIS — R079 Chest pain, unspecified: Secondary | ICD-10-CM | POA: Insufficient documentation

## 2022-01-03 DIAGNOSIS — J45909 Unspecified asthma, uncomplicated: Secondary | ICD-10-CM

## 2022-01-03 DIAGNOSIS — Z8616 Personal history of COVID-19: Secondary | ICD-10-CM

## 2022-01-03 DIAGNOSIS — R5383 Other fatigue: Secondary | ICD-10-CM

## 2022-01-03 DIAGNOSIS — I3139 Other pericardial effusion (noninflammatory): Secondary | ICD-10-CM

## 2022-01-03 MED ORDER — ALBUTEROL SULFATE (2.5 MG/3ML) 0.083% IN NEBU: 2.5000 mg | INHALATION_SOLUTION | Freq: Four times a day (QID) | RESPIRATORY_TRACT | 1 refills | Status: DC | PRN

## 2022-01-03 MED ORDER — IOHEXOL 350 MG/ML SOLN
100.0000 mL | Freq: Once | INTRAVENOUS | Status: AC | PRN
  Administered 2022-01-03: 100 mL via INTRAVENOUS

## 2022-01-03 MED ORDER — ALBUTEROL SULFATE HFA 108 (90 BASE) MCG/ACT IN AERS: 2.0000 | INHALATION_SPRAY | RESPIRATORY_TRACT | 1 refills | Status: DC | PRN

## 2022-01-03 MED ORDER — PREDNISONE 10 MG PO TABS: ORAL_TABLET | ORAL | 0 refills | Status: DC

## 2022-01-04 ENCOUNTER — Encounter: Payer: Self-pay | Admitting: Medical

## 2022-01-08 ENCOUNTER — Telehealth: Payer: Self-pay | Admitting: Medical

## 2022-01-08 NOTE — Telephone Encounter (Signed)
Pt has been out of work since February 21st and not been back to work yet. She said she is not really feeling any better. She does have cardiology appt on 02/01/22 ?

## 2022-01-08 NOTE — Telephone Encounter (Signed)
I received FMLA.  Please call to find out what specific days missed at work given recent illness ? ?

## 2022-01-08 NOTE — Telephone Encounter (Signed)
Tried to call pt but vm is full 

## 2022-01-10 ENCOUNTER — Other Ambulatory Visit: Payer: Self-pay | Admitting: Medical

## 2022-01-10 IMAGING — DX DG ANKLE COMPLETE 3+V*L*
3 series · 3 of 3 positions shown · non-contrast
Comparison: None.

CLINICAL DATA: Fall, lateral ankle pain

EXAM:
LEFT ANKLE COMPLETE - 3+ VIEW

[ankle ap]
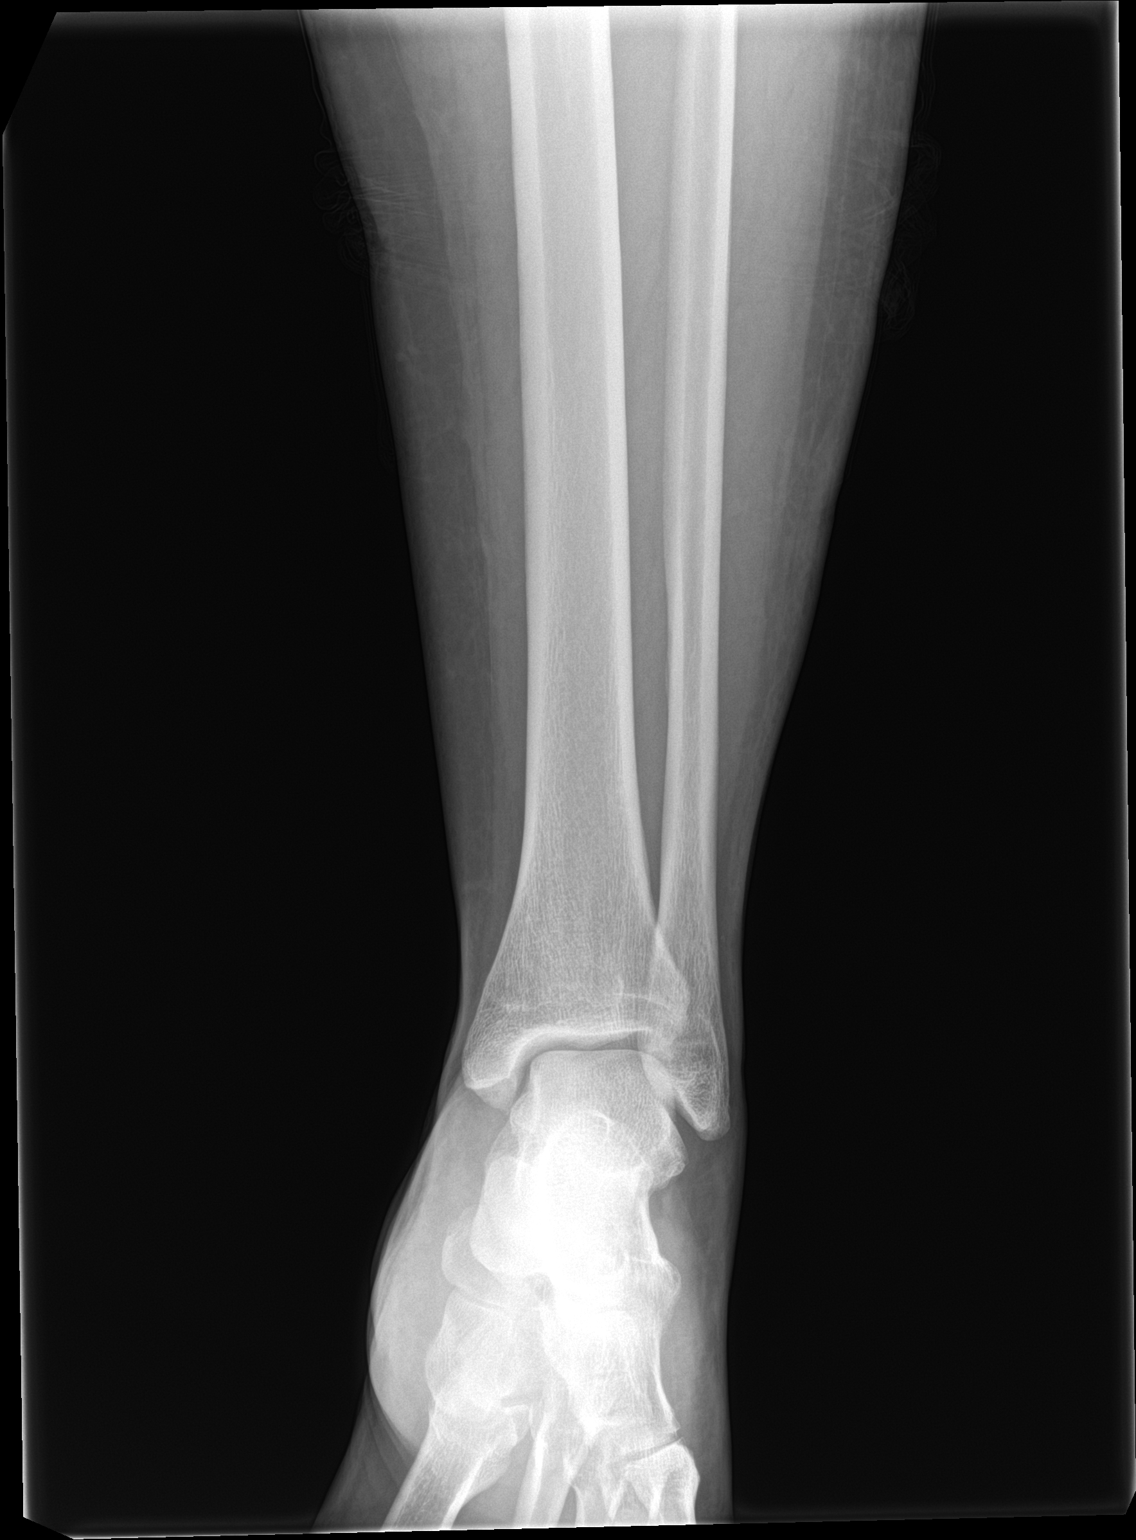

[ankle obl]
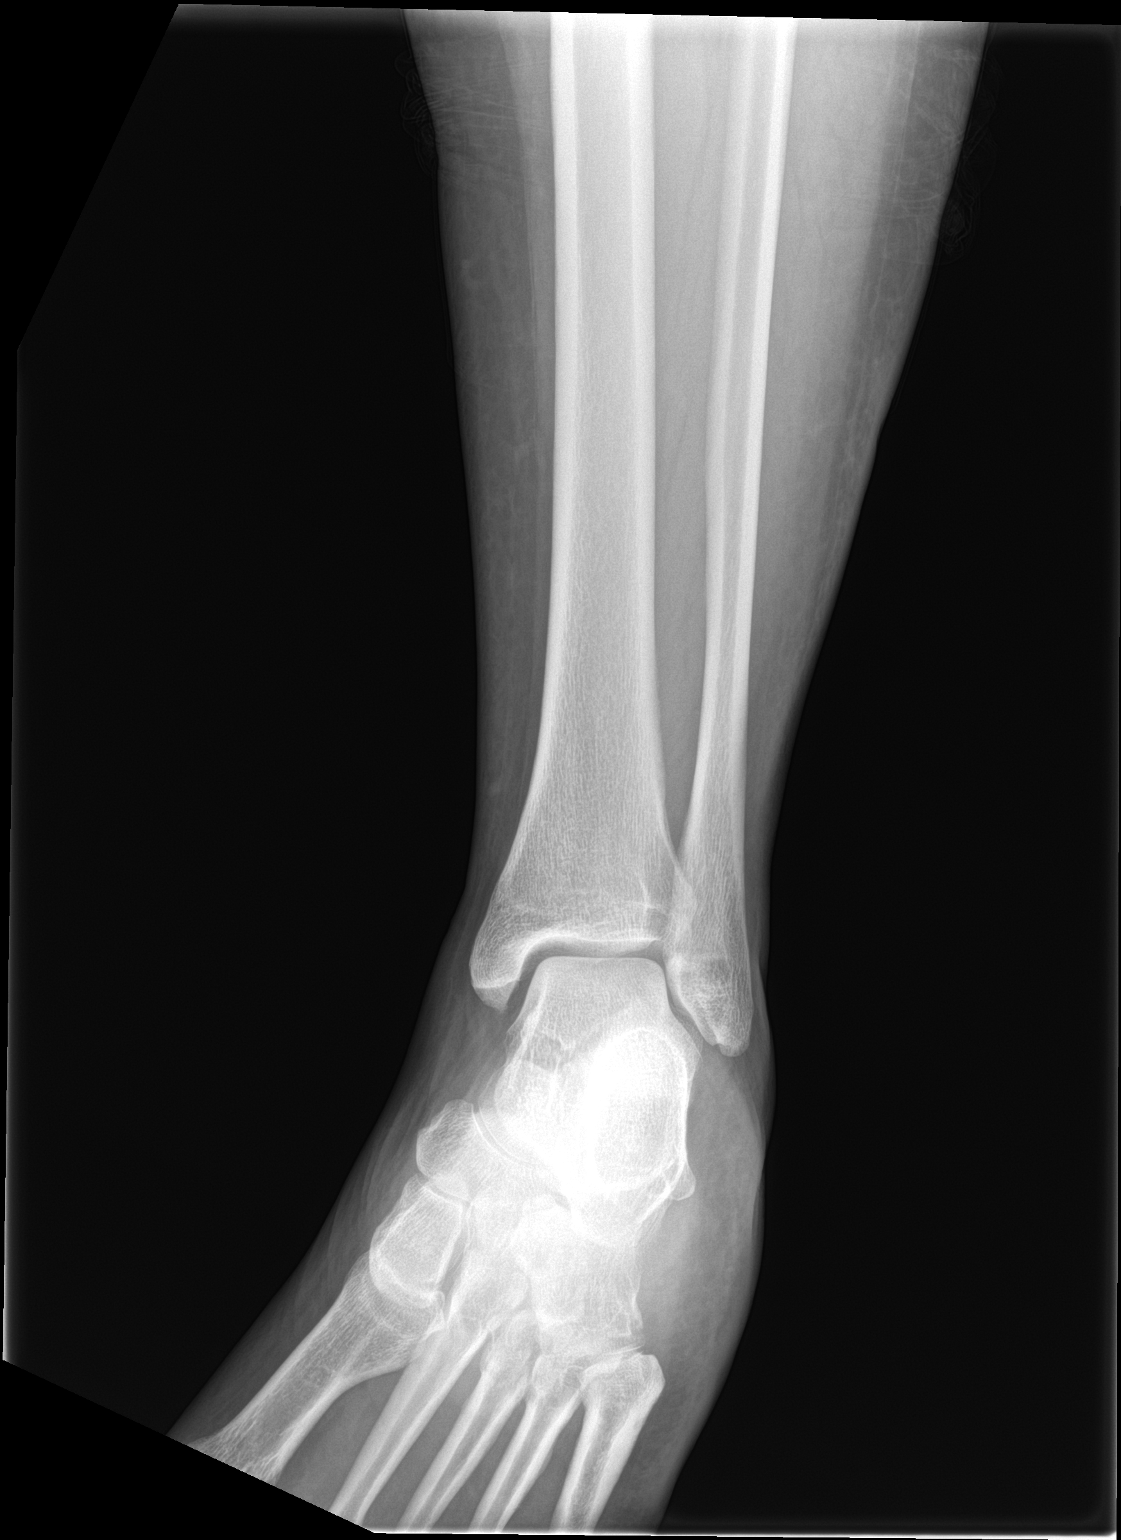

[ankle lat]
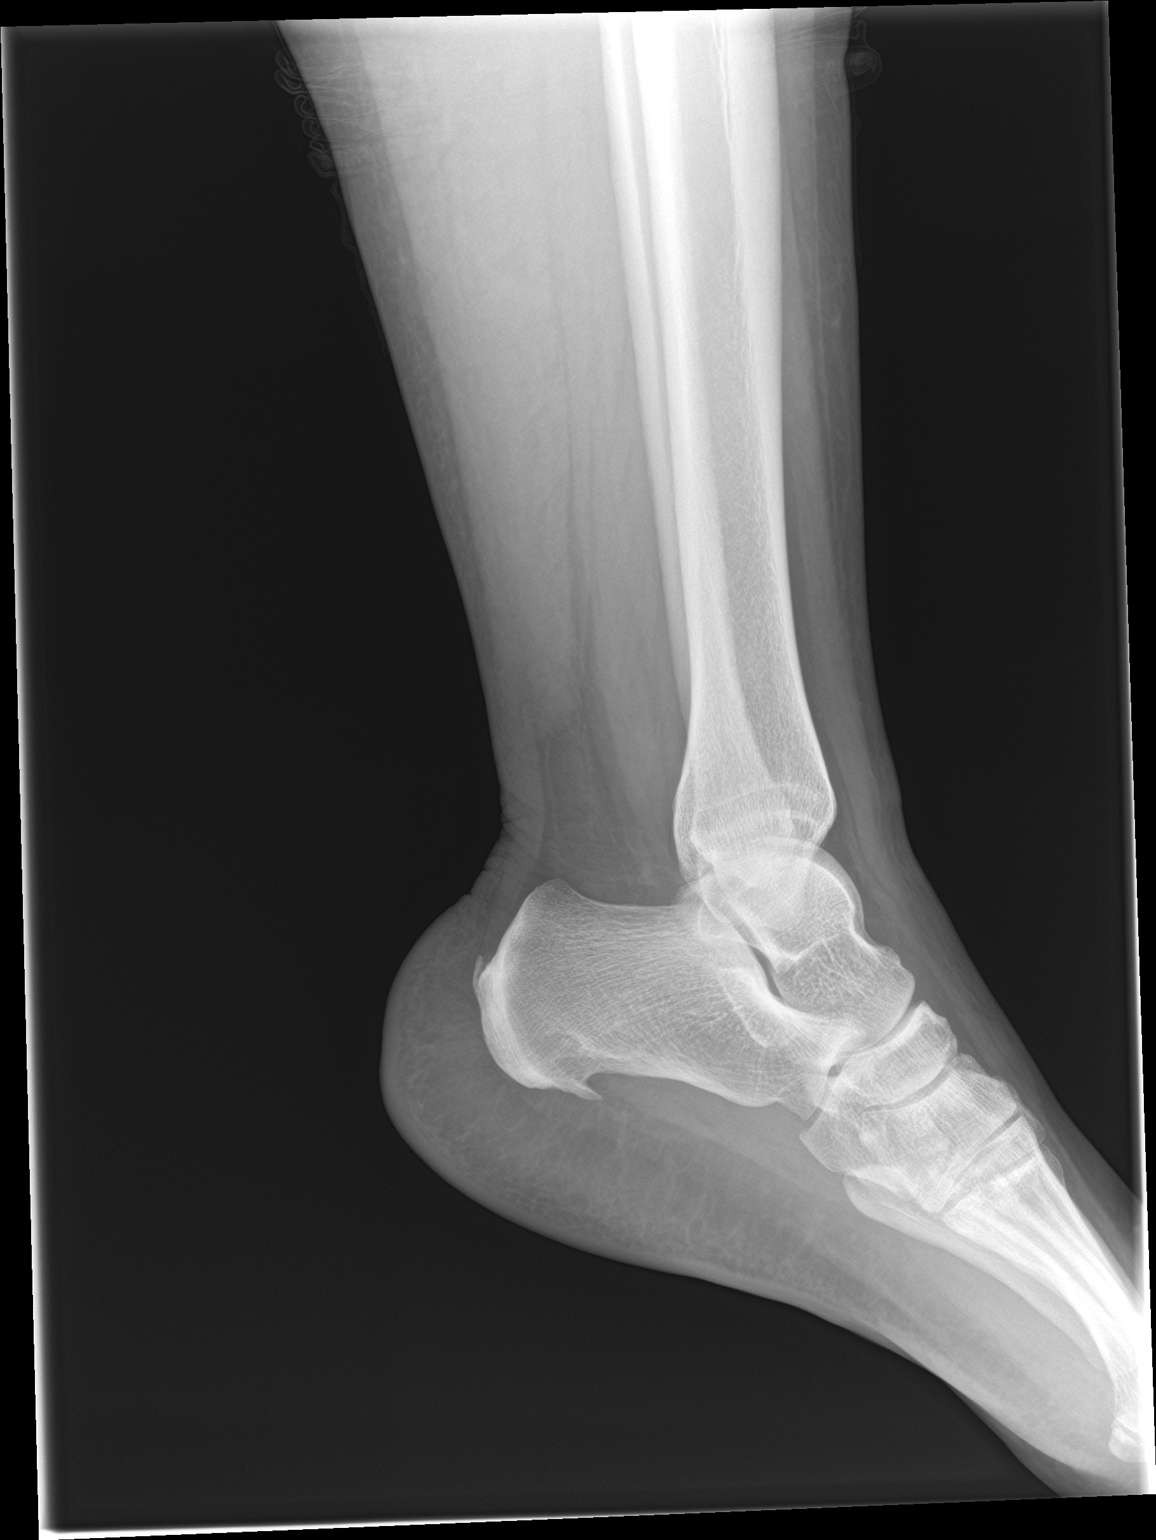

[3 of 3 positions shown; findings below may reference images not displayed]

FINDINGS: There is no evidence of fracture, dislocation, or joint effusion.
There is no evidence of arthropathy or other focal bone abnormality.
Soft tissues are unremarkable.
IMPRESSION: No fracture or dislocation of the left ankle.

## 2022-01-10 MED ORDER — PREDNISONE 20 MG PO TABS: 40.0000 mg | ORAL_TABLET | Freq: Every day | ORAL | 0 refills | Status: DC

## 2022-01-10 NOTE — Telephone Encounter (Signed)
April 6th.  ? ?Pt was notified that med was sent in.  ?

## 2022-01-10 NOTE — Telephone Encounter (Signed)
She said she is not really feeling any better. She does have cardiology appt on 02/01/22 ?

## 2022-01-12 ENCOUNTER — Telehealth: Payer: Self-pay | Admitting: Medical

## 2022-01-12 NOTE — Telephone Encounter (Signed)
Pt has not been at her job a year yet so FMLA was denied due to that ? ?Patient is now trying to go through a Financial assistance through [facility] with Lynne Logan. She needs a letter saying why she needs financial assistance. She has not had any income as she has been out from 12/19/21- 01/25/22. She still has incoming bills that are due. Please advise ?

## 2022-01-12 NOTE — Telephone Encounter (Signed)
Please advise 

## 2022-01-12 NOTE — Telephone Encounter (Signed)
Pt called, her FMLA was denied ? ?She is requesting letter stating how long she has been out of work and why she is out   ? ?Please call ?

## 2022-01-12 NOTE — Telephone Encounter (Signed)
Tried to call pt but vm is full 

## 2022-01-12 NOTE — Telephone Encounter (Signed)
Pt was notified of results

## 2022-01-12 NOTE — Telephone Encounter (Signed)
Pt states shes doing better. She is about 70% better ?

## 2022-01-15 ENCOUNTER — Encounter: Payer: Self-pay | Admitting: Medical

## 2022-01-15 NOTE — Telephone Encounter (Signed)
Called and left message for pt to come pick up letter that Audelia Acton has signed ?

## 2022-01-17 ENCOUNTER — Encounter: Payer: Self-pay | Admitting: Medical

## 2022-01-20 NOTE — Progress Notes (Signed)
?Cardiology Office Note:   ? ?Date:  02/01/2022  ? ?ID:  [name], DOB 06-16-1976, MRN [id number] ? ?PCP:  Carlena Hurl, PA-C  ?Cardiologist:  Elouise Munroe, MD  ?Electrophysiologist:  None  ? ?Referring MD: Carlena Hurl, PA-C  ? ?Chief Complaint: follow-up of shortness of breath ? ?History of Present Illness:   ? ?[name] is a 46 y.o. female with a history of chest pain with minimal CAD noted on coronary CTA in 09/2018, palpitations with rare PACs/PVCs noted on monitor in 08/2018, asthma with chronic shortness of breath, and recent COVID-19 pneumonia who is followed Dr. Margaretann Loveless and presents today for follow-up of shortness of breath. ? ?Patient was referred to Dr. Margaretann Loveless in 08/2018 for further evaluation of chest pain, shortness of breath, and palpitations. At that visit, she reported intermittent chest pain for the past several months that radiated to her back with associated dizziness. She also reported dyspnea on exertion and palpitations. Coronary CTA, Echo, and Holter Monitor were ordered for further evaluation. Coronary CTA showed coronary calcium score of of 0 with minimal CAD. Echo showed LVEF of 60-65% with normal wall motion, grade 2 diastolic dysfunction, and mild MR. Monitor showed rare PACs/PVCs. She does have a history of asthma with prior hospitalization and ED visits for asthma exacerbations.  ? ?Patient was last seen by Dr. Margaretann Loveless in 02/2019 at which time she denied any chest pain and she reported her dyspnea had improved. Chest CTA showed poor opacification of the pulmonary arteries but no large PE. Also showed a small pericardial effusion. She was treated with steroids but was hesitant to start Remdesivir during admission. However, she was started on Molnupiravir at outpatient follow-up. She was recently seen by PCP on 01/02/2022 and reported ongoing shortness of breath, cough, and fatigue. Sed rate was elevated at 91. D-dimer was elevated so repeat chest CTA was  ordered and was negative for PE but did show a small pericardial effusion. ? ?Patient was recently admitted in 11/2021 with sepsis secondary to COVID-19 pneumonia and acute asthma exacerbation. Chest CTA was negative for PE but showed small pericardial effusion with subsegmental atelectasis at the lung bases. She was treated with steroids but was hesitant to start Remdesivir. However, she agreed to start Molnupiravir at follow-up visit with PCP on 12/27/2021. She was was last seen by PCP on 01/02/2022 at which time she continued to report ongoing shortness of breath. Repeat chest CTA was again negative for PE but showed continued small pericardial effusion. She was advised to follow-up with Cardiology. ? ?Patient presents today for follow-up.  Here alone.  Patient has a history of intermittent shortness of breath due to her asthma; however, her dyspnea has been much worse since having COVID in 11/2021.  She notes persistent shortness of breath both at rest and with exertion.  She is noticeably short of breath while talking with me but O2 sats are normal.  She also describes new orthopnea and PND since COVID.  She reports substernal chest tightness which she thinks is different than the prior chest pain she has had.  It sounds like this is mostly associated with her acute shortness of breath.  She suspects it is due to her breathing and so do I.  She has lower extremity edema which is worse on the left side.  She also reports bilateral leg/feet numbness and some cramping in her left calf when ambulating.  She has good distal pulses.  D-dimer at PCPs office was  recently elevated.  CTA was ordered to rule out PE which was negative but no Dopplers have been done.  She has a history of palpitations with prior monitor showing rare PACs/PVCs.  She does state her palpitations have been getting worse especially with her shortness of breath.  She also reports lightheadedness when she is acutely short of breath but no  syncope. ? ?Past Medical History:  ?Diagnosis Date  ? Abdominal pain   ? Anemia   ? Asthma   ? Depression   ? Diarrhea   ? Heart murmur   ? Obesity   ? Plantar fasciitis   ? Shortness of breath   ? Vomiting   ? ? ?Past Surgical History:  ?Procedure Laterality Date  ? CHOLECYSTECTOMY  06/19/11  ? IR RADIOLOGY PERIPHERAL GUIDED IV START  10/23/2018  ? IR US GUIDE VASC ACCESS RIGHT  10/23/2018  ? TUBAL LIGATION    ? ? ?Current Medications: ?Current Meds  ?Medication Sig  ? albuterol (PROVENTIL) (2.5 MG/3ML) 0.083% nebulizer solution Take 3 mLs (2.5 mg total) by nebulization every 6 (six) hours as needed for wheezing or shortness of breath.  ? albuterol (VENTOLIN HFA) 108 (90 Base) MCG/ACT inhaler Inhale 2 puffs into the lungs every 4 (four) hours as needed for wheezing or shortness of breath.  ? clonazePAM (KLONOPIN) 0.5 MG tablet Take 1 tablet (0.5 mg total) by mouth 2 (two) times daily as needed for anxiety.  ? Fluticasone-Umeclidin-Vilant (TRELEGY ELLIPTA) 100-62.5-25 MCG/ACT AEPB Inhale 1 puff into the lungs daily.  ? furosemide (LASIX) 40 MG tablet Take 1 tablet (40 mg total) by mouth daily. Take 1 tablet daily for 3 days  ? Insulin Pen Needle (BD PEN NEEDLE NANO U/F) 32G X 4 MM MISC 1 each by Does not apply route at bedtime.  ? pantoprazole (PROTONIX) 40 MG tablet Take 1 tablet (40 mg total) by mouth daily.  ? potassium chloride SA (KLOR-CON M20) 20 MEQ tablet Take 1 tablet daily for 3 days.  ? predniSONE (DELTASONE) 20 MG tablet Take 2 tablets (40 mg total) by mouth daily with breakfast. 2 tablets daily for 10 days, then 1 tablet daily for 7 days, then 1/2 tablet daily for 7 days  ? promethazine-dextromethorphan (PROMETHAZINE-DM) 6.25-15 MG/5ML syrup Take 5 mLs by mouth 4 (four) times daily as needed for cough.  ? sertraline (ZOLOFT) 25 MG tablet TAKE 1 TABLET(25 MG) BY MOUTH DAILY (Patient taking differently: Take 25 mg by mouth daily.)  ?  ? ?Allergies:   Patient has no known allergies.  ? ?Social History   ? ?Socioeconomic History  ? Marital status: Single  ?  Spouse name: Not on file  ? Number of children: Not on file  ? Years of education: Not on file  ? Highest education level: Not on file  ?Occupational History  ? Not on file  ?Tobacco Use  ? Smoking status: Never  ? Smokeless tobacco: Never  ?Vaping Use  ? Vaping Use: Never used  ?Substance and Sexual Activity  ? Alcohol use: No  ? Drug use: No  ? Sexual activity: Not Currently  ?  Birth control/protection: None  ?Other Topics Concern  ? Not on file  ?Social History Narrative  ? Lives with husband, son, dog.   Exercise - walking.  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  ? ?Social Determinants of Health  ? ?Financial Resource Strain: Not on file  ?Food Insecurity: Not on file  ?Transportation Needs: Not on file  ?  Physical Activity: Not on file  ?Stress: Not on file  ?Social Connections: Not on file  ?  ? ?Family History: ?The patient's family history includes Aneurysm in her brother; Cancer in her brother, maternal grandmother, and maternal uncle; Diabetes (age of onset: 49) in her mother; Emphysema in her maternal aunt; Emphysema (age of onset: 19) in her father; Esophageal cancer in her maternal aunt; Heart disease in her maternal aunt, maternal grandfather, and sister; Heart disease (age of onset: 41) in her sister; Heart disease (age of onset: 61) in her father; Hypertension in her mother; Lung cancer in her maternal aunt; Other in her mother. There is no history of Colon cancer, Colon polyps, Rectal cancer, or Stomach cancer. ? ?ROS:   ?Please see the history of present illness.    ? ?EKGs/Labs/Other Studies Reviewed:   ? ?The following studies were reviewed today: ? ?Echocardiogram 08/27/2018: ?Study Conclusions: ?- Left ventricle: The cavity size was normal. Wall thickness was  ?  normal. Systolic function was normal. The estimated ejection  ?  fraction was in the range of 60% to 65%. Wall motion was normal;  ?  there were no regional wall motion  abnormalities. Features are  ?  consistent with a pseudonormal left ventricular filling pattern,  ?  with concomitant abnormal relaxation and increased filling  ?  pressure (grade 2 diastolic dysfunction).  ?- Mitral valve

## 2022-02-01 ENCOUNTER — Ambulatory Visit (INDEPENDENT_AMBULATORY_CARE_PROVIDER_SITE_OTHER): Payer: 59 | Admitting: Student

## 2022-02-01 ENCOUNTER — Encounter: Payer: Self-pay | Admitting: Student

## 2022-02-01 VITALS — BP 128/72 | HR 70 | Ht 70.0 in | Wt 259.2 lb

## 2022-02-01 DIAGNOSIS — R002 Palpitations: Secondary | ICD-10-CM | POA: Diagnosis not present

## 2022-02-01 DIAGNOSIS — R079 Chest pain, unspecified: Secondary | ICD-10-CM | POA: Diagnosis not present

## 2022-02-01 DIAGNOSIS — R600 Localized edema: Secondary | ICD-10-CM

## 2022-02-01 DIAGNOSIS — R0600 Dyspnea, unspecified: Secondary | ICD-10-CM | POA: Diagnosis not present

## 2022-02-01 MED ORDER — POTASSIUM CHLORIDE CRYS ER 20 MEQ PO TBCR: EXTENDED_RELEASE_TABLET | ORAL | 0 refills | Status: DC

## 2022-02-01 MED ORDER — FUROSEMIDE 40 MG PO TABS: 40.0000 mg | ORAL_TABLET | Freq: Every day | ORAL | 0 refills | Status: DC

## 2022-02-01 NOTE — Patient Instructions (Addendum)
Medication Instructions:  ?Start Lasix 40 mg daily for 3 days ?Start Potassium meq daily for 3 days when you take Lasix ?Your physician recommends that you continue on your current medications as directed. Please refer to the Current Medication list given to you today.  ?*If you need a refill on your cardiac medications before your next appointment, please call your pharmacy* ? ? ?Lab Work: ?Your physician recommends that you complete labs today ?BMET ?BNP ?Magnesium ?TSH ? ?If you have labs (blood work) drawn today and your tests are completely normal, you will receive your results only by: ?MyChart Message (if you have MyChart) OR ?A paper copy in the mail ?If you have any lab test that is abnormal or we need to change your treatment, we will call you to review the results. ? ? ?Testing/Procedures: ?Your physician has requested that you have an echocardiogram. Echocardiography is a painless test that uses sound waves to create images of your heart. It provides your doctor with information about the size and shape of your heart and how well your heart?s chambers and valves are working. This procedure takes approximately one hour. There are no restrictions for this procedure.  ? ?Your physician has requested that you have a lower or upper extremity venous duplex. This test is an ultrasound of the veins in the legs or arms. It looks at venous blood flow that carries blood from the heart to the legs or arms. Allow one hour for a Lower Venous exam. Allow thirty minutes for an Upper Venous exam. There are no restrictions or special instructions. ?  ? ? ?Follow-Up: ?At [facility], you and your health needs are our priority.  As part of our continuing mission to provide you with exceptional heart care, we have created designated Provider Care Teams.  These Care Teams include your primary Cardiologist (physician) and Advanced Practice Providers (APPs -  Physician Assistants and Nurse Practitioners) who all work  together to provide you with the care you need, when you need it. ? ?We recommend signing up for the patient portal called "MyChart".  Sign up information is provided on this After Visit Summary.  MyChart is used to connect with patients for Virtual Visits (Telemedicine).  Patients are able to view lab/test results, encounter notes, upcoming appointments, etc.  Non-urgent messages can be sent to your provider as well.   ?To learn more about what you can do with MyChart, go to [email or web address].   ? ?Your next appointment:   ?3 week(s) ? ?The format for your next appointment:   ?In Person ? ?Provider:   ?Sande Rives, PA-C      ? ? ?Other Instructions ? ?

## 2022-02-02 LAB — TSH: TSH: 1.26 u[IU]/mL (ref 0.450–4.500)

## 2022-02-02 LAB — BRAIN NATRIURETIC PEPTIDE: BNP: 45 pg/mL (ref 0.0–100.0)

## 2022-02-02 LAB — MAGNESIUM: Magnesium: 2 mg/dL (ref 1.6–2.3)

## 2022-02-08 ENCOUNTER — Ambulatory Visit (INDEPENDENT_AMBULATORY_CARE_PROVIDER_SITE_OTHER): Payer: 59

## 2022-02-08 ENCOUNTER — Telehealth: Payer: Self-pay

## 2022-02-08 DIAGNOSIS — R002 Palpitations: Secondary | ICD-10-CM | POA: Diagnosis not present

## 2022-02-08 DIAGNOSIS — R079 Chest pain, unspecified: Secondary | ICD-10-CM

## 2022-02-08 DIAGNOSIS — R0600 Dyspnea, unspecified: Secondary | ICD-10-CM

## 2022-02-08 LAB — ECHOCARDIOGRAM COMPLETE
AR max vel: 1.81 cm2
AV Area VTI: 2.14 cm2
AV Area mean vel: 1.9 cm2
AV Mean grad: 6 mmHg
AV Peak grad: 11.8 mmHg
Ao pk vel: 1.72 m/s
Area-P 1/2: 3.63 cm2
S' Lateral: 3.32 cm

## 2022-02-08 NOTE — Telephone Encounter (Signed)
Called pt. And got her scheduled tomorrow per your recommendation.  ?

## 2022-02-08 NOTE — Telephone Encounter (Signed)
Nurse from [facility] called stating that she has one of our pts. There now they just did an echo an her and she was having such a hard time breathing they had to give her oxygen. She told them she has been not doing well since having covid 6 weeks ago because she has asthma. They wanted to know if you wanted to see her or refer her somewhere. They wanted to  know if someone could call her back today. They are holding her there until on oxygen until her breathing gets better. She also told them she has to sleep upright at night just to breath better. Let me know your recommendations so I can call her back today. Thank you.  ?

## 2022-02-09 ENCOUNTER — Ambulatory Visit: Payer: 59 | Admitting: Family Medicine

## 2022-02-12 ENCOUNTER — Telehealth: Payer: Self-pay | Admitting: Internal Medicine

## 2022-02-12 ENCOUNTER — Ambulatory Visit: Payer: 59 | Admitting: Family Medicine

## 2022-02-12 NOTE — Telephone Encounter (Signed)
Patient has no showed 3 times in the last year ? ?No showed in December for CPE ?Cancelled CPE in February ?No showed Friday 02/09/2022 due to SOB and rescheduled for Monday 02/12/22 and no showed today. ? ?Also has a $400 balance as well. ? ?Please advise how you want to move forward ?

## 2022-02-13 NOTE — Telephone Encounter (Signed)
Please advise about dismissal ?

## 2022-02-13 NOTE — Progress Notes (Deleted)
Cardiology Office Note:    Date:  02/13/2022   ID:  [name], DOB Mar 05, 1976, MRN [id number]  PCP:  Carlena Hurl, PA-C  Cardiologist:  Elouise Munroe, MD  Electrophysiologist:  None   Referring MD: Carlena Hurl, PA-C   Chief Complaint: follow-up of shortness of breath  History of Present Illness:    [name] is a 46 y.o. female with a history of chest pain with minimal CAD noted on coronary CTA in 09/2018, palpitations with rare PACs/PVCs noted on monitor in 08/2018, asthma with chronic shortness of breath, and recent COVID-19 pneumonia who is followed Dr. Margaretann Loveless and presents today for follow-up of shortness of breath.   Patient was referred to Dr. Margaretann Loveless in 08/2018 for further evaluation of chest pain, shortness of breath, and palpitations. At that visit, she reported intermittent chest pain for the past several months that radiated to her back with associated dizziness. She also reported dyspnea on exertion and palpitations. Coronary CTA, Echo, and Holter Monitor were ordered for further evaluation. Coronary CTA showed coronary calcium score of of 0 with minimal CAD. Echo showed LVEF of 60-65% with normal wall motion, grade 2 diastolic dysfunction, and mild MR. Monitor showed rare PACs/PVCs. She does have a history of asthma with prior hospitalization and ED visits for asthma exacerbations.    Patient was last seen by Dr. Margaretann Loveless in 02/2019 at which time she denied any chest pain and she reported her dyspnea had improved. Chest CTA showed poor opacification of the pulmonary arteries but no large PE. Also showed a small pericardial effusion. She was treated with steroids but was hesitant to start Remdesivir during admission. However, she was started on Molnupiravir at outpatient follow-up. She was recently seen by PCP on 01/02/2022 and reported ongoing shortness of breath, cough, and fatigue. Sed rate was elevated at 91. D-dimer was elevated so repeat chest CTA  was ordered and was negative for PE but did show a small pericardial effusion.   Patient was recently admitted in 11/2021 with sepsis secondary to COVID-19 pneumonia and acute asthma exacerbation. Chest CTA was negative for PE but showed small pericardial effusion with subsegmental atelectasis at the lung bases. She was treated with steroids but was hesitant to start Remdesivir. However, she agreed to start Molnupiravir at follow-up visit with PCP on 12/27/2021. She was was last seen by PCP on 01/02/2022 at which time she continued to report ongoing shortness of breath. Repeat chest CTA was again negative for PE but showed continued small pericardial effusion. She was advised to follow-up with Cardiology.   She was seen by me on 02/01/2022 at which time she reported persistent shortness of breath and was noticeably short of breath while talking but O2 sats were normal. She also reported some substernal chest tightness associated with her acute shortness of breath. She also had bilateral lower extremity that was noticeably worse on the left as well as worsening palpitations. Patient was started on Lasix for 3 days but BNP came back normal. Echo and and lower extremity dopplers were ordered for further evaluation. Echo showed LVEF of 60-65% with normal wall motion and grade 1 diastolic dysfunction, normal RV, and no significant valvular disease. Lower extremity dopplers showed ***  Patient presents today for follow-up. ***  Dyspnea Patient has a history of chronic dyspnea but has had significant shortness of breath since being admitted for COVID pneumonia and asthma exacerbation in 11/2021. CTA was negative for PE. BNP at last visit earlier this  month was normal. Echo on 02/08/2022 showed LVEF of 60-65% with only grade 1 diastolic dysfunction. Prior coronary CTA in 09/2018 showed coronary calcium score of 0 with only minimal CAD.  - *** - Do not think dyspnea is cardiac in nature. *** Will refer to Pulmonology.    Lower Extremity Edema Patient has bilateral lower extremity edema but left leg is noticeably larger than the right. D-dimer at PCP's office on 01/02/2022 was elevated at 1.13. Lower extremity dopplers showed ***  Chest Pain History of chest pain. Coronary CTA in 09/2018 showed coronary calcium score of 0 with minimal CAD. Chest pain reported chest tightness at last visit which seemed to be related to times of acute shortness of breath.  ***   Palpitations Monitor in 08/2018 showed rare PACs/PVCs.  Patient reported worsening palpitations at last visit earlier this month. TSH and electrolytes were normal.  - ***   Past Medical History:  Diagnosis Date   Abdominal pain    Anemia    Asthma    Depression    Diarrhea    Heart murmur    Obesity    Plantar fasciitis    Shortness of breath    Vomiting     Past Surgical History:  Procedure Laterality Date   CHOLECYSTECTOMY  06/19/11   IR RADIOLOGY PERIPHERAL GUIDED IV START  10/23/2018   IR US GUIDE VASC ACCESS RIGHT  10/23/2018   TUBAL LIGATION      Current Medications: No outpatient medications have been marked as taking for the 02/22/22 encounter (Appointment) with Darreld Mclean, PA-C.     Allergies:   Patient has no known allergies.   Social History   Socioeconomic History   Marital status: Single    Spouse name: Not on file   Number of children: Not on file   Years of education: Not on file   Highest education level: Not on file  Occupational History   Not on file  Tobacco Use   Smoking status: Never   Smokeless tobacco: Never  Vaping Use   Vaping Use: Never used  Substance and Sexual Activity   Alcohol use: No   Drug use: No   Sexual activity: Not Currently    Birth control/protection: None  Other Topics Concern   Not on file  Social History Narrative   Lives with husband, son, dog.   Exercise - walking.  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   Social Determinants of Health   Financial Resource  Strain: Not on file  Food Insecurity: Not on file  Transportation Needs: Not on file  Physical Activity: Not on file  Stress: Not on file  Social Connections: Not on file     Family History: The patient's family history includes Aneurysm in her brother; Cancer in her brother, maternal grandmother, and maternal uncle; Diabetes (age of onset: 22) in her mother; Emphysema in her maternal aunt; Emphysema (age of onset: 29) in her father; Esophageal cancer in her maternal aunt; Heart disease in her maternal aunt, maternal grandfather, and sister; Heart disease (age of onset: 36) in her sister; Heart disease (age of onset: 41) in her father; Hypertension in her mother; Lung cancer in her maternal aunt; Other in her mother. There is no history of Colon cancer, Colon polyps, Rectal cancer, or Stomach cancer.  ROS:   Please see the history of present illness.     EKGs/Labs/Other Studies Reviewed:    The following studies were reviewed today:  Holter  Monitor 08/2018: Indication: palpitations Monitor: 48 hr holter Diary: no diary events entered   Supraventricular Ectopy: 4 beats, <0.01% of total. Ventricular Ectopy: 4 beats, <0.01% of total.   NSVT: none Ventricular Tachycardia: none   Pauses: none AV block: none    Minimum HR: 41 bpm Maximum HR: 135 bpm   Impression: Minimal supraventricular and ventricular ectopy. No diary events recorded. _______________   Coronary CTA 10/23/2018: Impressions: 1. Coronary calcium score of 0. This was 0 percentile for age and sex matched control. 2. Normal coronary origin with right dominance vs. codominance. 3.  Minimal CAD, CADRADS = 1. _______________  Echocardiogram 02/08/2022: Impressions:  1. Left ventricular ejection fraction, by estimation, is 60 to 65%. The  left ventricle has normal function. The left ventricle has no regional  wall motion abnormalities. Left ventricular diastolic parameters are  consistent with Grade I diastolic   dysfunction (impaired relaxation).   2. Right ventricular systolic function is normal. The right ventricular  size is normal. Tricuspid regurgitation signal is inadequate for assessing  PA pressure.   3. The mitral valve is normal in structure. No evidence of mitral valve  regurgitation. No evidence of mitral stenosis.   4. The aortic valve is tricuspid. Aortic valve regurgitation is not  visualized. No aortic stenosis is present.   5. The inferior vena cava is normal in size with greater than 50%  respiratory variability, suggesting right atrial pressure of 3 mmHg.   Comparison(s): No significant change from prior study. EF 60%.  _______________  Lower Extremity Dopplers 02/19/2022: ***  EKG:  EKG not ordered today.   Recent Labs: 12/24/2021: ALT 20 01/02/2022: BUN 8; Creatinine, Ser 0.74; Hemoglobin 10.9; Platelets 228; Potassium 4.0; Sodium 140 02/01/2022: BNP 45.0; Magnesium 2.0; TSH 1.260  Recent Lipid Panel    Component Value Date/Time   CHOL 173 05/09/2020 1604   TRIG 109 05/09/2020 1604   HDL 58 05/09/2020 1604   CHOLHDL 3.0 05/09/2020 1604   CHOLHDL 2.2 Ratio 01/23/2008 1936   VLDL 11 01/23/2008 1936   LDLCALC 95 05/09/2020 1604    Physical Exam:    Vital Signs: There were no vitals taken for this visit.    Wt Readings from Last 3 Encounters:  02/01/22 259 lb 3.2 oz (117.6 kg)  01/02/22 255 lb 3.2 oz (115.8 kg)  12/27/21 240 lb (108.9 kg)     General: 46 y.o. female in no acute distress. HEENT: Normocephalic and atraumatic. Sclera clear. EOMs intact. Neck: Supple. No carotid bruits. No JVD. Heart: *** RRR. Distinct S1 and S2. No murmurs, gallops, or rubs. Radial and distal pedal pulses 2+ and equal bilaterally. Lungs: No increased work of breathing. Clear to ausculation bilaterally. No wheezes, rhonchi, or rales.  Abdomen: Soft, non-distended, and non-tender to palpation. Bowel sounds present in all 4 quadrants.  MSK: Normal strength and tone for age.  *** Extremities: No lower extremity edema.    Skin: Warm and dry. Neuro: Alert and oriented x3. No focal deficits. Psych: Normal affect. Responds appropriately.   Assessment:    No diagnosis found.  Plan:     Disposition: Follow up in ***   Medication Adjustments/Labs and Tests Ordered: Current medicines are reviewed at length with the patient today.  Concerns regarding medicines are outlined above.  No orders of the defined types were placed in this encounter.  No orders of the defined types were placed in this encounter.   There are no Patient Instructions on file for this visit.   Signed,  Darreld Mclean, PA-C  02/13/2022 5:30 PM    [facility]

## 2022-02-15 ENCOUNTER — Encounter: Payer: Self-pay | Admitting: Family Medicine

## 2022-02-19 ENCOUNTER — Ambulatory Visit (HOSPITAL_COMMUNITY)
Admission: RE | Admit: 2022-02-19 | Discharge: 2022-02-19 | Disposition: A | Discharge status: Home / Self Care | Payer: 59 | Source: Ambulatory Visit | Attending: Cardiology | Admitting: Cardiology

## 2022-02-19 DIAGNOSIS — R600 Localized edema: Secondary | ICD-10-CM | POA: Diagnosis not present

## 2022-02-19 DIAGNOSIS — R0600 Dyspnea, unspecified: Secondary | ICD-10-CM | POA: Diagnosis not present

## 2022-02-19 DIAGNOSIS — R079 Chest pain, unspecified: Secondary | ICD-10-CM

## 2022-02-19 DIAGNOSIS — R002 Palpitations: Secondary | ICD-10-CM | POA: Diagnosis not present

## 2022-02-20 ENCOUNTER — Other Ambulatory Visit (HOSPITAL_COMMUNITY): Payer: 59

## 2022-02-21 ENCOUNTER — Encounter (HOSPITAL_COMMUNITY): Payer: 59

## 2022-02-22 ENCOUNTER — Ambulatory Visit: Payer: 59 | Admitting: Student

## 2022-02-22 NOTE — Progress Notes (Deleted)
Cardiology Clinic Note   Patient Name: [name] Date of Encounter: 02/22/2022  Primary Care Provider:  Carlena Hurl, PA-C Primary Cardiologist:  Elouise Munroe, MD  Patient Profile     46 y.o. female with a history of chest pain with minimal CAD noted on coronary CTA in 09/2018, palpitations with rare PACs/PVCs noted on monitor in 08/2018, asthma with chronic shortness of breath, and recent COVID-19 pneumonia  Echo, and Holter Monitor were ordered for further evaluation. Coronary CTA showed coronary calcium score of of 0 with minimal CAD. Echo showed LVEF of 60-65% with normal wall motion, grade 2 diastolic dysfunction, and mild MR. Last seen in the office by Sande Rives, PA, on 02/01/2022 with recurrent complaints of chest tightness and shortness of breath.  An echocardiogram and a BNP was ordered to evaluate for changes in LV function and for volume overload.  It was suspected that her dyspnea may be related to long-haul COVID however she was given a dose of Lasix 40 mg daily for 3 days with potassium replacement.  Due to lower extremity edema the patient was also to have bilateral venous Dopplers to rule out DVT.  ABIs can be considered on follow-up.  Past Medical History    Past Medical History:  Diagnosis Date   Abdominal pain    Anemia    Asthma    Depression    Diarrhea    Heart murmur    Obesity    Plantar fasciitis    Shortness of breath    Vomiting    Past Surgical History:  Procedure Laterality Date   CHOLECYSTECTOMY  06/19/11   IR RADIOLOGY PERIPHERAL GUIDED IV START  10/23/2018   IR US GUIDE VASC ACCESS RIGHT  10/23/2018   TUBAL LIGATION      Allergies  No Known Allergies  History of Present Illness    [name] presents today for close follow-up after being seen in the office 2 weeks ago with complaints of shortness of breath and lower extremity edema.  She was placed on 40 mg of Lasix daily for 3 days with potassium replacement,  echocardiogram was ordered and she also had Doppler studies done of her lower extremities to rule out DVT.  Echocardiogram on 02/08/2022 revealed normal LV systolic function with an EF of 60 to 65%.  She was found to have grade 1 diastolic dysfunction.  There is no evidence of valvular disease.  Doppler studies did not reveal evidence of DVT bilaterally.  BNP was normal, magnesium 2.0 TSH 1.26.  Home Medications    Current Outpatient Medications  Medication Sig Dispense Refill   albuterol (PROVENTIL) (2.5 MG/3ML) 0.083% nebulizer solution Take 3 mLs (2.5 mg total) by nebulization every 6 (six) hours as needed for wheezing or shortness of breath. 75 mL 1   albuterol (VENTOLIN HFA) 108 (90 Base) MCG/ACT inhaler Inhale 2 puffs into the lungs every 4 (four) hours as needed for wheezing or shortness of breath. 18 g 1   clonazePAM (KLONOPIN) 0.5 MG tablet Take 1 tablet (0.5 mg total) by mouth 2 (two) times daily as needed for anxiety. 20 tablet 0   Fluticasone-Umeclidin-Vilant (TRELEGY ELLIPTA) 100-62.5-25 MCG/ACT AEPB Inhale 1 puff into the lungs daily. 1 each 5   furosemide (LASIX) 40 MG tablet Take 1 tablet (40 mg total) by mouth daily. Take 1 tablet daily for 3 days 30 tablet 0   Insulin Pen Needle (BD PEN NEEDLE NANO U/F) 32G X 4 MM MISC 1 each by  Does not apply route at bedtime. 30 each 2   pantoprazole (PROTONIX) 40 MG tablet Take 1 tablet (40 mg total) by mouth daily. 90 tablet 3   potassium chloride SA (KLOR-CON M20) 20 MEQ tablet Take 1 tablet daily for 3 days. 30 tablet 0   predniSONE (DELTASONE) 20 MG tablet Take 2 tablets (40 mg total) by mouth daily with breakfast. 2 tablets daily for 10 days, then 1 tablet daily for 7 days, then 1/2 tablet daily for 7 days 32 tablet 0   promethazine-dextromethorphan (PROMETHAZINE-DM) 6.25-15 MG/5ML syrup Take 5 mLs by mouth 4 (four) times daily as needed for cough. 120 mL 0   sertraline (ZOLOFT) 25 MG tablet TAKE 1 TABLET(25 MG) BY MOUTH DAILY (Patient  taking differently: Take 25 mg by mouth daily.) 90 tablet 0   No current facility-administered medications for this visit.     Family History    Family History  Problem Relation Age of Onset   Diabetes Mother 20   Hypertension Mother    Other Mother        sepsis   Emphysema Father 66       smoked   Heart disease Father 65       MI   Emphysema Maternal Aunt        smoked   Esophageal cancer Maternal Aunt    Lung cancer Maternal Aunt        smoked   Heart disease Maternal Grandfather    Heart disease Maternal Aunt    Heart disease Sister 24       MI   Cancer Brother    Cancer Maternal Uncle    Cancer Maternal Grandmother    Heart disease Sister    Aneurysm Brother    Colon cancer Neg Hx    Colon polyps Neg Hx    Rectal cancer Neg Hx    Stomach cancer Neg Hx    She indicated that her mother is deceased. She indicated that her father is deceased. She indicated that both of her sisters are alive. She indicated that five of her six brothers are alive. She indicated that the status of her maternal grandmother is unknown. She indicated that the status of her maternal grandfather is unknown. She indicated that the status of her maternal uncle is unknown. She indicated that the status of her neg hx is unknown.  Social History    Social History   Socioeconomic History   Marital status: Single    Spouse name: Not on file   Number of children: Not on file   Years of education: Not on file   Highest education level: Not on file  Occupational History   Not on file  Tobacco Use   Smoking status: Never   Smokeless tobacco: Never  Vaping Use   Vaping Use: Never used  Substance and Sexual Activity   Alcohol use: No   Drug use: No   Sexual activity: Not Currently    Birth control/protection: None  Other Topics Concern   Not on file  Social History Narrative   Lives with husband, son, dog.   Exercise - walking.  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   Social  Determinants of Health   Financial Resource Strain: Not on file  Food Insecurity: Not on file  Transportation Needs: Not on file  Physical Activity: Not on file  Stress: Not on file  Social Connections: Not on file  Intimate Partner Violence: Not on file  Review of Systems    General:  No chills, fever, night sweats or weight changes.  Cardiovascular:  No chest pain, dyspnea on exertion, edema, orthopnea, palpitations, paroxysmal nocturnal dyspnea. Dermatological: No rash, lesions/masses Respiratory: No cough, dyspnea Urologic: No hematuria, dysuria Abdominal:   No nausea, vomiting, diarrhea, bright red blood per rectum, melena, or hematemesis Neurologic:  No visual changes, wkns, changes in mental status. All other systems reviewed and are otherwise negative except as noted above.     Physical Exam    VS:  There were no vitals taken for this visit. , BMI There is no height or weight on file to calculate BMI.     GEN: Well nourished, well developed, in no acute distress. HEENT: normal. Neck: Supple, no JVD, carotid bruits, or masses. Cardiac: RRR, no murmurs, rubs, or gallops. No clubbing, cyanosis, edema.  Radials/DP/PT 2+ and equal bilaterally.  Respiratory:  Respirations regular and unlabored, clear to auscultation bilaterally. GI: Soft, nontender, nondistended, BS + x 4. MS: no deformity or atrophy. Skin: warm and dry, no rash. Neuro:  Strength and sensation are intact. Psych: Normal affect.  Accessory Clinical Findings    ECG personally reviewed by me today- *** - No acute changes  Lab Results  Component Value Date   WBC 6.9 01/02/2022   HGB 10.9 (L) 01/02/2022   HCT 34.0 01/02/2022   MCV 74 (L) 01/02/2022   PLT 228 01/02/2022   Lab Results  Component Value Date   CREATININE 0.74 01/02/2022   BUN 8 01/02/2022   NA 140 01/02/2022   K 4.0 01/02/2022   CL 106 01/02/2022   CO2 21 01/02/2022   Lab Results  Component Value Date   ALT 20 12/24/2021    AST 23 12/24/2021   ALKPHOS 57 12/24/2021   BILITOT 0.6 12/24/2021   Lab Results  Component Value Date   CHOL 173 05/09/2020   HDL 58 05/09/2020   LDLCALC 95 05/09/2020   TRIG 109 05/09/2020   CHOLHDL 3.0 05/09/2020    Lab Results  Component Value Date   HGBA1C 5.3 01/01/2012    Review of Prior Studies: Echocardiogram 4/10/31/2021 1. Left ventricular ejection fraction, by estimation, is 60 to 65%. The  left ventricle has normal function. The left ventricle has no regional  wall motion abnormalities. Left ventricular diastolic parameters are  consistent with Grade I diastolic  dysfunction (impaired relaxation).   2. Right ventricular systolic function is normal. The right ventricular  size is normal. Tricuspid regurgitation signal is inadequate for assessing  PA pressure.   3. The mitral valve is normal in structure. No evidence of mitral valve  regurgitation. No evidence of mitral stenosis.   4. The aortic valve is tricuspid. Aortic valve regurgitation is not  visualized. No aortic stenosis is present.   5. The inferior vena cava is normal in size with greater than 50%  respiratory variability, suggesting right atrial pressure of 3 mmHg.   Vas Ultrasound LE 02/19/2022 RIGHT:  - No evidence of common femoral vein obstruction.     LEFT:  - No evidence of deep vein thrombosis in the lower extremity. No indirect  evidence of obstruction proximal to the inguinal ligament.  - No cystic structure found in the popliteal fossa.   Assessment & Plan   1.  ***    Current medicines are reviewed at length with the patient today.  I have spent *** min's  dedicated to the care of this patient on the date of this encounter  to include pre-visit review of records, assessment, management and diagnostic testing,with shared decision making. Signed, Phill Myron. West Pugh, ANP, AACC   02/22/2022 2:01 PM    [facility] [place] [address] Office  [phone] Fax [phone]  Notice: This dictation was prepared with Dragon dictation along with smaller phrase technology. Any transcriptional errors that result from this process are unintentional and may not be corrected upon review.

## 2022-02-23 ENCOUNTER — Ambulatory Visit: Payer: 59 | Admitting: Adult Health

## 2022-02-27 ENCOUNTER — Encounter: Payer: Self-pay | Admitting: Adult Health

## 2022-03-19 ENCOUNTER — Ambulatory Visit: Payer: 59 | Admitting: Internal Medicine

## 2022-03-27 ENCOUNTER — Other Ambulatory Visit: Payer: Self-pay

## 2022-03-27 ENCOUNTER — Ambulatory Visit (INDEPENDENT_AMBULATORY_CARE_PROVIDER_SITE_OTHER): Payer: 59 | Admitting: Internal Medicine

## 2022-03-27 ENCOUNTER — Encounter: Payer: Self-pay | Admitting: Internal Medicine

## 2022-03-27 DIAGNOSIS — J4541 Moderate persistent asthma with (acute) exacerbation: Secondary | ICD-10-CM

## 2022-03-27 MED ORDER — ALBUTEROL SULFATE (2.5 MG/3ML) 0.083% IN NEBU: 2.5000 mg | INHALATION_SOLUTION | Freq: Four times a day (QID) | RESPIRATORY_TRACT | 1 refills | Status: DC | PRN

## 2022-03-27 MED ORDER — PREDNISONE 20 MG PO TABS: 40.0000 mg | ORAL_TABLET | Freq: Every day | ORAL | 0 refills | Status: AC

## 2022-03-27 MED ORDER — ANORO ELLIPTA 62.5-25 MCG/ACT IN AEPB
1.0000 | INHALATION_SPRAY | Freq: Every day | RESPIRATORY_TRACT | 3 refills | Status: DC
Start: 2022-03-27 — End: 2022-05-10

## 2022-03-27 MED ORDER — IPRATROPIUM-ALBUTEROL 0.5-2.5 (3) MG/3ML IN SOLN
3.0000 mL | Freq: Once | RESPIRATORY_TRACT | Status: AC
  Administered 2022-03-27: 3 mL via RESPIRATORY_TRACT

## 2022-03-27 MED ORDER — ALBUTEROL SULFATE HFA 108 (90 BASE) MCG/ACT IN AERS: 2.0000 | INHALATION_SPRAY | RESPIRATORY_TRACT | 1 refills | Status: DC | PRN

## 2022-03-27 MED ORDER — ALBUTEROL SULFATE (2.5 MG/3ML) 0.083% IN NEBU: 2.5000 mg | INHALATION_SOLUTION | Freq: Once | RESPIRATORY_TRACT | Status: DC

## 2022-03-27 NOTE — Progress Notes (Signed)
CC: wheezing, coughing, SOB  HPI:  [name] is a 46 y.o. female with a past medical history stated below and presents today for cc listed above. Please see problem based assessment and plan for additional details.  Past Medical History:  Diagnosis Date   Abdominal pain    Anemia    Asthma    Depression    Diarrhea    Heart murmur    Obesity    Plantar fasciitis    Shortness of breath    Vomiting     Current Outpatient Medications on File Prior to Visit  Medication Sig Dispense Refill   clonazePAM (KLONOPIN) 0.5 MG tablet Take 1 tablet (0.5 mg total) by mouth 2 (two) times daily as needed for anxiety. 20 tablet 0   furosemide (LASIX) 40 MG tablet Take 1 tablet (40 mg total) by mouth daily. Take 1 tablet daily for 3 days 30 tablet 0   Insulin Pen Needle (BD PEN NEEDLE NANO U/F) 32G X 4 MM MISC 1 each by Does not apply route at bedtime. 30 each 2   pantoprazole (PROTONIX) 40 MG tablet Take 1 tablet (40 mg total) by mouth daily. 90 tablet 3   potassium chloride SA (KLOR-CON M20) 20 MEQ tablet Take 1 tablet daily for 3 days. 30 tablet 0   promethazine-dextromethorphan (PROMETHAZINE-DM) 6.25-15 MG/5ML syrup Take 5 mLs by mouth 4 (four) times daily as needed for cough. 120 mL 0   sertraline (ZOLOFT) 25 MG tablet TAKE 1 TABLET(25 MG) BY MOUTH DAILY (Patient taking differently: Take 25 mg by mouth daily.) 90 tablet 0   No current facility-administered medications on file prior to visit.    Family History  Problem Relation Age of Onset   Diabetes Mother 61   Hypertension Mother    Other Mother        sepsis   Emphysema Father 71       smoked   Heart disease Father 2       MI   Emphysema Maternal Aunt        smoked   Esophageal cancer Maternal Aunt    Lung cancer Maternal Aunt        smoked   Heart disease Maternal Grandfather    Heart disease Maternal Aunt    Heart disease Sister 10       MI   Cancer Brother    Cancer Maternal Uncle    Cancer Maternal  Grandmother    Heart disease Sister    Aneurysm Brother    Colon cancer Neg Hx    Colon polyps Neg Hx    Rectal cancer Neg Hx    Stomach cancer Neg Hx     Social History   Socioeconomic History   Marital status: Single    Spouse name: Not on file   Number of children: Not on file   Years of education: Not on file   Highest education level: Not on file  Occupational History   Not on file  Tobacco Use   Smoking status: Never   Smokeless tobacco: Never  Vaping Use   Vaping Use: Never used  Substance and Sexual Activity   Alcohol use: No   Drug use: No   Sexual activity: Not Currently    Birth control/protection: None  Other Topics Concern   Not on file  Social History Narrative   Lives with husband, son, dog.   Exercise - walking.  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  Social Determinants of Health   Financial Resource Strain: Not on file  Food Insecurity: Not on file  Transportation Needs: Not on file  Physical Activity: Not on file  Stress: Not on file  Social Connections: Not on file  Intimate Partner Violence: Not on file    Review of Systems: ROS negative except for what is noted on the assessment and plan.  Vitals:   03/27/22 1521  BP: 122/83  Pulse: 85  Resp: (!) 28  Temp: 98.2 F (36.8 C)  TempSrc: Oral  SpO2: 100%  Weight: 257 lb 14.4 oz (117 kg)  Height: '5\' 10"'$  (1.778 m)     Physical Exam: Constitutional: ill-appearing middle aged woman sitting in the chair, HENT: normocephalic atraumatic, mucous membranes moist Eyes: conjunctiva non-erythematous Neck: supple Cardiovascular: regular rate and rhythm, no m/r/g Pulmonary/Chest: increased work of breathing on room air, audible wheezing, wheezing appreciated at bilateral lung bases and anteriorly Abdominal: soft, non-tender, non-distended MSK: normal bulk and tone Neurological: alert & oriented x 3, 5/5 strength in bilateral upper and lower extremities, normal gait Skin: warm and  dry Psych: Normal behavior    Assessment & Plan:   See Encounters Tab for problem based charting.  Patient seen with Dr. Deretha Emory, M.D. [facility], PGY-1 Pager: [phone], Phone: [phone] Date 03/27/2022 Time 4:30 PM

## 2022-03-27 NOTE — Assessment & Plan Note (Addendum)
Patient had an asthma exacerbation back in February 2023 she was treated with prednisone and nebulizer treatment.  March 2023 she had COVID and since then she noticed persistent coughing and intermittent shortness of breath.  Symptoms worse at night.  This has been going on over the last 2 months.  Home medication includes albuterol rescue inhaler, albuterol nebulizer and trelegy samples from her previous office visit.  1 week ago she ran out of Trelegy and 3 days ago she ran out of her albuterol rescue inhaler.  Since running out of her inhalers her symptoms worsened.  She endorsed nosebleeds daily, audible wheezing and progressive worsening shortness of breath.  She endorsed partial relief with albuterol nebulizers, however, the symptoms would reemerge hours later.  She denies sick contacts.  She does have a history of seasonal allergies previously treated with Claritin-D.  Currently does not take any antihistamines for her allergy symptoms.  She has no pets in the home and no carpet in the home.  Post COVID can result in exacerbation of symptoms with individuals with underlying lung conditions such as asthma.  Coupled with running out of her inhalers and seasonal allergies, both can be contributing to her current presentation.  On exam, patient noted to have increased work of breathing, audible wheezing and wheezing appreciated at bilateral lung bases and anteriorly.  Normal heart sounds.  Patient received DuoNeb treatment in office with partial relief.  Patient was given work note to be excused from work tonight until breathing status improved.  Refills on albuterol rescue inhaler and prescription for Anoro Ellipta daily.  Also prescribed 5-day course of prednisone.  Given ongoing asthma symptoms, patient would likely benefit from referral to pulmonology.  Low suspicion for pneumonia, chest x-ray not indicated at this time.  Symptoms consistent with acute asthma exacerbation.   P: -Refilled albuterol rescue  inhaler -Anoro Ellipta prescribed -Prednisone 40 mg daily x5 days -Follow-up in 2 weeks -Referral to pulmonology

## 2022-03-27 NOTE — Patient Instructions (Addendum)
Thank you, Ms.Jimmey Ralph for allowing [place] to provide your care today.    The following medications have been sent to your pharmacy: Albuterol rescue inhaler Anora Ellipta Prednisone 40 mg daily x 5 days   Referrals ordered today:   Referral Orders         Ambulatory referral to Pulmonology      Follow up: 2 weeks   Should you have any questions or concerns please call the internal medicine clinic at [phone].    Timothy Lasso, MD [place]

## 2022-03-28 NOTE — Progress Notes (Addendum)
Internal Medicine Clinic Attending  I saw and evaluated the patient.  I personally confirmed the key portions of the history and exam documented by Dr. Ariwodo and I reviewed pertinent patient test results.  The assessment, diagnosis, and plan were formulated together and I agree with the documentation in the resident's note.   

## 2022-03-28 NOTE — Addendum Note (Signed)
Addended by: Lalla Brothers T on: 03/28/2022 08:36 AM   Modules accepted: Level of Service

## 2022-05-04 ENCOUNTER — Encounter (HOSPITAL_BASED_OUTPATIENT_CLINIC_OR_DEPARTMENT_OTHER): Payer: Self-pay | Admitting: Emergency Medicine

## 2022-05-04 ENCOUNTER — Other Ambulatory Visit: Payer: Self-pay

## 2022-05-04 ENCOUNTER — Emergency Department (HOSPITAL_BASED_OUTPATIENT_CLINIC_OR_DEPARTMENT_OTHER)
Admission: EM | Admit: 2022-05-04 | Discharge: 2022-05-04 | Disposition: A | Discharge status: Home / Self Care | Payer: 59 | Attending: Emergency Medicine | Admitting: Emergency Medicine

## 2022-05-04 DIAGNOSIS — Z794 Long term (current) use of insulin: Secondary | ICD-10-CM | POA: Diagnosis not present

## 2022-05-04 DIAGNOSIS — N938 Other specified abnormal uterine and vaginal bleeding: Secondary | ICD-10-CM | POA: Diagnosis present

## 2022-05-04 LAB — CBC WITH DIFFERENTIAL/PLATELET
Abs Immature Granulocytes: 0.01 10*3/uL (ref 0.00–0.07)
Basophils Absolute: 0 10*3/uL (ref 0.0–0.1)
Basophils Relative: 1 %
Eosinophils Absolute: 0.2 10*3/uL (ref 0.0–0.5)
Eosinophils Relative: 2 %
HCT: 34.2 % — ABNORMAL LOW (ref 36.0–46.0)
Hemoglobin: 10.3 g/dL — ABNORMAL LOW (ref 12.0–15.0)
Immature Granulocytes: 0 %
Lymphocytes Relative: 35 %
Lymphs Abs: 2.4 10*3/uL (ref 0.7–4.0)
MCH: 23.6 pg — ABNORMAL LOW (ref 26.0–34.0)
MCHC: 30.1 g/dL (ref 30.0–36.0)
MCV: 78.3 fL — ABNORMAL LOW (ref 80.0–100.0)
Monocytes Absolute: 0.5 10*3/uL (ref 0.1–1.0)
Monocytes Relative: 7 %
Neutro Abs: 3.9 10*3/uL (ref 1.7–7.7)
Neutrophils Relative %: 55 %
Platelets: 196 10*3/uL (ref 150–400)
RBC: 4.37 MIL/uL (ref 3.87–5.11)
RDW: 13.8 % (ref 11.5–15.5)
WBC: 6.9 10*3/uL (ref 4.0–10.5)
nRBC: 0 % (ref 0.0–0.2)

## 2022-05-04 LAB — URINALYSIS, MICROSCOPIC (REFLEX): RBC / HPF: >50 RBC/hpf (ref 0–5)

## 2022-05-04 LAB — URINALYSIS, ROUTINE W REFLEX MICROSCOPIC
Bilirubin Urine: NEGATIVE
Glucose, UA: NEGATIVE mg/dL
Ketones, ur: NEGATIVE mg/dL
Leukocytes,Ua: NEGATIVE
Nitrite: NEGATIVE
Protein, ur: NEGATIVE mg/dL
Specific Gravity, Urine: >=1.03 (ref 1.005–1.030)
pH: 6 (ref 5.0–8.0)

## 2022-05-04 LAB — PREGNANCY, URINE: Preg Test, Ur: NEGATIVE

## 2022-05-04 MED ORDER — KETOROLAC TROMETHAMINE 15 MG/ML IJ SOLN
15.0000 mg | Freq: Once | INTRAMUSCULAR | Status: AC
  Administered 2022-05-04: 15 mg via INTRAMUSCULAR
  Filled 2022-05-04: qty 1

## 2022-05-04 NOTE — Discharge Instructions (Signed)
Take 4 over the counter ibuprofen tablets 3 times a day or 2 over-the-counter naproxen tablets twice a day for pain. Also take tylenol '1000mg'$ (2 extra strength) four times a day.    Please return for worsening bleeding passing out or feel like you might pass out.

## 2022-05-04 NOTE — ED Provider Notes (Signed)
[facility] EMERGENCY DEPARTMENT Provider Note   CSN: [id number] Arrival date & time: 05/04/22  0003     History  Chief Complaint  Patient presents with   Vaginal Bleeding    [name] is a 46 y.o. female.  46 yo F with a chief complaints of heavy vaginal bleeding.  This been going on for the past 6 days.  She has been feeling a bit lightheaded and dizzy.  Has had some lower abdominal cramping.  No fevers no chills.  Has not seen her OB/GYN in about a year or so.  Has had an episode like this in the past but was isolated.   Vaginal Bleeding      Home Medications Prior to Admission medications   Medication Sig Start Date End Date Taking? Authorizing Provider  albuterol (PROVENTIL) (2.5 MG/3ML) 0.083% nebulizer solution Take 3 mLs (2.5 mg total) by nebulization every 6 (six) hours as needed for wheezing or shortness of breath. 03/27/22   Timothy Lasso, MD  albuterol (VENTOLIN HFA) 108 (90 Base) MCG/ACT inhaler Inhale 2 puffs into the lungs every 4 (four) hours as needed for wheezing or shortness of breath. 03/27/22   Timothy Lasso, MD  clonazePAM (KLONOPIN) 0.5 MG tablet Take 1 tablet (0.5 mg total) by mouth 2 (two) times daily as needed for anxiety. 12/27/21   Tysinger, Camelia Eng, PA-C  furosemide (LASIX) 40 MG tablet Take 1 tablet (40 mg total) by mouth daily. Take 1 tablet daily for 3 days 02/01/22   Darreld Mclean, PA-C  Insulin Pen Needle (BD PEN NEEDLE NANO U/F) 32G X 4 MM MISC 1 each by Does not apply route at bedtime. 10/05/20   Tysinger, Camelia Eng, PA-C  pantoprazole (PROTONIX) 40 MG tablet Take 1 tablet (40 mg total) by mouth daily. 10/05/20   Tysinger, Camelia Eng, PA-C  potassium chloride SA (KLOR-CON M20) 20 MEQ tablet Take 1 tablet daily for 3 days. 02/01/22   Darreld Mclean, PA-C  promethazine-dextromethorphan (PROMETHAZINE-DM) 6.25-15 MG/5ML syrup Take 5 mLs by mouth 4 (four) times daily as needed for cough. 12/27/21   Tysinger, Camelia Eng, PA-C  sertraline  (ZOLOFT) 25 MG tablet TAKE 1 TABLET(25 MG) BY MOUTH DAILY Patient taking differently: Take 25 mg by mouth daily. 12/14/21   Tysinger, Camelia Eng, PA-C  umeclidinium-vilanterol (ANORO ELLIPTA) 62.5-25 MCG/ACT AEPB Inhale 1 puff into the lungs daily. 03/27/22   Timothy Lasso, MD      Allergies    Patient has no known allergies.    Review of Systems   Review of Systems  Genitourinary:  Positive for vaginal bleeding.    Physical Exam Updated Vital Signs BP 114/67   Pulse 81   Temp 98.3 F (36.8 C) (Oral)   Resp 16   Ht 5' 10.5" (1.791 m)   Wt 117.9 kg   LMP 04/28/2022 (Approximate)   SpO2 100%   BMI 36.78 kg/m  Physical Exam Vitals and nursing note reviewed.  Constitutional:      General: She is not in acute distress.    Appearance: She is well-developed. She is not diaphoretic.  HENT:     Head: Normocephalic and atraumatic.  Eyes:     Pupils: Pupils are equal, round, and reactive to light.  Cardiovascular:     Rate and Rhythm: Normal rate and regular rhythm.     Heart sounds: No murmur heard.    No friction rub. No gallop.  Pulmonary:     Effort: Pulmonary effort is normal.  Breath sounds: No wheezing or rales.  Abdominal:     General: There is no distension.     Palpations: Abdomen is soft.     Tenderness: There is no abdominal tenderness.  Genitourinary:    Comments: Trace blood in the vault.  No adnexal tenderness or masses.  No cervical motion tenderness. Musculoskeletal:        General: No tenderness.     Cervical back: Normal range of motion and neck supple.  Skin:    General: Skin is warm and dry.  Neurological:     Mental Status: She is alert and oriented to person, place, and time.  Psychiatric:        Behavior: Behavior normal.     ED Results / Procedures / Treatments   Labs (all labs ordered are listed, but only abnormal results are displayed) Labs Reviewed  URINALYSIS, ROUTINE W REFLEX MICROSCOPIC - Abnormal; Notable for the following  components:      Result Value   APPearance HAZY (*)    Hgb urine dipstick LARGE (*)    All other components within normal limits  CBC WITH DIFFERENTIAL/PLATELET - Abnormal; Notable for the following components:   Hemoglobin 10.3 (*)    HCT 34.2 (*)    MCV 78.3 (*)    MCH 23.6 (*)    All other components within normal limits  URINALYSIS, MICROSCOPIC (REFLEX) - Abnormal; Notable for the following components:   Bacteria, UA FEW (*)    All other components within normal limits  PREGNANCY, URINE    EKG None  Radiology No results found.  Procedures Procedures    Medications Ordered in ED Medications  ketorolac (TORADOL) 15 MG/ML injection 15 mg (15 mg Intramuscular Given 05/04/22 0208)    ED Course/ Medical Decision Making/ A&P                           Medical Decision Making Amount and/or Complexity of Data Reviewed Labs: ordered.  Risk Prescription drug management.   46 yo F with a chief complaints of vaginal bleeding.  Going on for about 6 days now.  She has been feeling a little bit lightheaded and dizzy.  No tachycardia no hypotension.  Benign abdominal exam.  We will check an H&H.  Pregnancy test.  Pregnancy test is negative.  Hemoglobin is at baseline.  UA negative for infection is independently interpreted by me.  Given a dose of Toradol here.  We will have her follow-up with her OB/GYN in the office.  2:25 AM:  I have discussed the diagnosis/risks/treatment options with the patient.  Evaluation and diagnostic testing in the emergency department does not suggest an emergent condition requiring admission or immediate intervention beyond what has been performed at this time.  They will follow up with  GYN. We also discussed returning to the ED immediately if new or worsening sx occur. We discussed the sx which are most concerning (e.g., sudden worsening pain, fever, inability to tolerate by mouth) that necessitate immediate return. Medications administered to the patient  during their visit and any new prescriptions provided to the patient are listed below.  Medications given during this visit Medications  ketorolac (TORADOL) 15 MG/ML injection 15 mg (15 mg Intramuscular Given 05/04/22 0208)     The patient appears reasonably screen and/or stabilized for discharge and I doubt any other medical condition or other [facility] requiring further screening, evaluation, or treatment in the ED at this time prior to  discharge.          Final Clinical Impression(s) / ED Diagnoses Final diagnoses:  Dysfunctional uterine bleeding    Rx / DC Orders ED Discharge Orders     None         Deno Etienne, DO 05/04/22 0225

## 2022-05-04 NOTE — ED Triage Notes (Signed)
Pt reports heavy vaginal bleed x 6 days. Reports soaking through a menstrual pad "every 10 minutes" Also endorses lower abdominal pain. Seen for same in past, was told she needed a hysterectomy, but did not follow up.

## 2022-05-10 ENCOUNTER — Ambulatory Visit (INDEPENDENT_AMBULATORY_CARE_PROVIDER_SITE_OTHER): Payer: 59 | Admitting: Internal Medicine

## 2022-05-10 ENCOUNTER — Encounter: Payer: Self-pay | Admitting: Internal Medicine

## 2022-05-10 VITALS — BP 110/70 | HR 84 | Ht 70.0 in | Wt 257.4 lb

## 2022-05-10 DIAGNOSIS — J4541 Moderate persistent asthma with (acute) exacerbation: Secondary | ICD-10-CM

## 2022-05-10 MED ORDER — PREDNISONE 10 MG PO TABS: ORAL_TABLET | ORAL | 0 refills | Status: AC

## 2022-05-10 MED ORDER — IPRATROPIUM-ALBUTEROL 0.5-2.5 (3) MG/3ML IN SOLN
3.0000 mL | RESPIRATORY_TRACT | Status: DC | PRN
  Administered 2022-05-10: 3 mL via RESPIRATORY_TRACT

## 2022-05-10 MED ORDER — FLUTICASONE-SALMETEROL 230-21 MCG/ACT IN AERO: 2.0000 | INHALATION_SPRAY | Freq: Two times a day (BID) | RESPIRATORY_TRACT | 12 refills | Status: DC

## 2022-05-10 NOTE — Progress Notes (Signed)
The patient has been prescribed the inhaler advair. Inhaler technique was demonstrated to patient. The patient subsequently demonstrated correct technique.  

## 2022-05-10 NOTE — Patient Instructions (Addendum)
Please schedule follow up scheduled with myself in 1 months.  If my schedule is not open yet, we will contact you with a reminder closer to that time. Please call [phone] if you haven't heard from [place] a month before.   Finish prednisone taper as I prescribed.  Stop anoro inhaler. Start taking advair 2 puffs in the morning, 2 puffs at night. Gargle after use.   Take the albuterol rescue inhaler every 4 to 6 hours as needed for wheezing or shortness of breath. You can take this up to 4 times a day.  You can also take it 15 minutes before exercise or exertional activity. Side effects include heart racing or pounding, jitters or anxiety. If you have a history of an irregular heart rhythm, it can make this worse. Can also give some patients a hard time sleeping.   By learning about asthma and how it can be controlled, you take an important step toward managing this disease. Work closely with your asthma care team to learn all you can about your asthma, how to avoid triggers, what your medications do, and how to take them correctly. With proper care, you can live free of asthma symptoms and maintain a normal, healthy lifestyle.   What is asthma? Asthma is a chronic disease that affects the airways of the lungs. During normal breathing, the bands of muscle that surround the airways are relaxed and air moves freely. During an asthma episode or "attack," there are three main changes that stop air from moving easily through the airways: The bands of muscle that surround the airways tighten and make the airways narrow. This tightening is called bronchospasm.  The lining of the airways becomes swollen or inflamed.  The cells that line the airways produce more mucus, which is thicker than normal and clogs the airways.  These three factors - bronchospasm, inflammation, and mucus production - cause symptoms such as difficulty breathing, wheezing, and coughing.  What are the most common symptoms of  asthma? Asthma symptoms are not the same for everyone. They can even change from episode to episode in the same person. Also, you may have only one symptom of asthma, such as cough, but another person may have all the symptoms of asthma. It is important to know all the symptoms of asthma and to be aware that your asthma can present in any of these ways at any time. The most common symptoms include: Coughing, especially at night  Shortness of breath  Wheezing  Chest tightness, pain, or pressure   Who is affected by asthma? Asthma affects 22 million Americans; about 6 million of these are children under age 32. People who have a family history of asthma have an increased risk of developing the disease. Asthma is also more common in people who have allergies or who are exposed to tobacco smoke. However, anyone can develop asthma at any time. Some people may have asthma all of their lives, while others may develop it as adults.  What causes asthma? The airways in a person with asthma are very sensitive and react to many things, or "triggers." Contact with these triggers causes asthma symptoms. One of the most important parts of asthma control is to identify your triggers and then avoid them when possible. The only trigger you do not want to avoid is exercise. Pre-treatment with medicines before exercise can allow you to stay active yet avoid asthma symptoms. Common asthma triggers include: Infections (colds, viruses, flu, sinus infections)  Exercise  Weather (changes in temperature and/or humidity, cold air)  Tobacco smoke  Allergens (dust mites, pollens, pets, mold spores, cockroaches, and sometimes foods)  Irritants (strong odors from cleaning products, perfume, wood smoke, air pollution)  Strong emotions such as crying or laughing hard  Some medications   How is asthma diagnosed? To diagnose asthma, your doctor will first review your medical history, family history, and symptoms. Your doctor  will want to know any past history of breathing problems you may have had, as well as a family history of asthma, allergies, eczema (a bumpy, itchy skin rash caused by allergies), or other lung disease. It is important that you describe your symptoms in detail (cough, wheeze, shortness of breath, chest tightness), including when and how often they occur. The doctor will perform a physical examination and listen to your heart and lungs. He or she may also order breathing tests, allergy tests, blood tests, and chest and sinus X-rays. The tests will find out if you do have asthma and if there are any other conditions that are contributing factors.  How is asthma treated? Asthma can be controlled, but not cured. It is not normal to have frequent symptoms, trouble sleeping, or trouble completing tasks. Appropriate asthma care will prevent symptoms and visits to the emergency room and hospital. Asthma medicines are one of the mainstays of asthma treatment. The drugs used to treat asthma are explained below.  Anti-inflammatories: These are the most important drugs for most people with asthma. Anti-inflammatory drugs reduce swelling and mucus production in the airways. As a result, airways are less sensitive and less likely to react to triggers. These medications need to be taken daily and may need to be taken for several weeks before they begin to control asthma. Anti-inflammatory medicines lead to fewer symptoms, better airflow, less sensitive airways, less airway damage, and fewer asthma attacks. If taken every day, they CONTROL or prevent asthma symptoms.   Bronchodilators: These drugs relax the muscle bands that tighten around the airways. This action opens the airways, letting more air in and out of the lungs and improving breathing. Bronchodilators also help clear mucus from the lungs. As the airways open, the mucus moves more freely and can be coughed out more easily. In short-acting forms, bronchodilators  RELIEVE or stop asthma symptoms by quickly opening the airways and are very helpful during an asthma episode. In long-acting forms, bronchodilators provide CONTROL of asthma symptoms and prevent asthma episodes.  Asthma drugs can be taken in a variety of ways. Inhaling the medications by using a metered dose inhaler, dry powder inhaler, or nebulizer is one way of taking asthma medicines. Oral medicines (pills or liquids you swallow) may also be prescribed.  Asthma severity Asthma is classified as either "intermittent" (comes and goes) or "persistent" (lasting). Persistent asthma is further described as being mild, moderate, or severe. The severity of asthma is based on how often you have symptoms both during the day and night, as well as by the results of lung function tests and by how well you can perform activities. The "severity" of asthma refers to how "intense" or "strong" your asthma is.  Asthma control Asthma control is the goal of asthma treatment. Regardless of your asthma severity, it may or may not be controlled. Asthma control means: You are able to do everything you want to do at work and home  You have no (or minimal) asthma symptoms  You do not wake up from your sleep or earlier than usual in  the morning due to asthma  You rarely need to use your reliever medicine (inhaler)  Another major part of your treatment is that you are happy with your asthma care and believe your asthma is controlled.  Monitoring symptoms A key part of treatment is keeping track of how well your lungs are working. Monitoring your symptoms, what they are, how and when they happen, and how severe they are, is an important part of being able to control your asthma.  Sometimes asthma is monitored using a peak flow meter. A peak flow (PF) meter measures how fast the air comes out of your lungs. It can help you know when your asthma is getting worse, sometimes even before you have symptoms. By taking daily peak flow  readings, you can learn when to adjust medications to keep asthma under good control. It is also used to create your asthma action plan (see below). Your doctor can use your peak flow readings to adjust your treatment plan in some cases.  Asthma Action Plan Based on your history and asthma severity, you and your doctor will develop a care plan called an "asthma action plan." The asthma action plan describes when and how to use your medicines, actions to take when asthma worsens, and when to seek emergency care. Make sure you understand this plan. If you do not, ask your asthma care provider any questions you may have. Your asthma action plan is one of the keys to controlling asthma. Keep it readily available to remind you of what you need to do every day to control asthma and what you need to do when symptoms occur.  Goals of asthma therapy These are the goals of asthma treatment: Live an active, normal life  Prevent chronic and troublesome symptoms  Attend work or school every day  Perform daily activities without difficulty  Stop urgent visits to the doctor, emergency department, or hospital  Use and adjust medications to control asthma with few or no side effects

## 2022-05-10 NOTE — Progress Notes (Signed)
[name]    [id number]    Dec 03, 1975  Primary Care Physician:Ariwodo, Enid Derry, MD (Inactive)  Referring Physician: Velna Ochs, MD [place],  [place] [id number] Reason for Consultation: cough and wheezing Date of Consultation: 05/10/2022  Chief complaint:   Chief Complaint  Patient presents with   Consult    Pt consult asthma, [name]     HPI: [name]  is a 46 y.o. woman here for new patient evaluation for shortness of breath and wheezing.   In March 2023 she had covid infection and since then has had ongoing coughing, chest tightness, wheezing, fatigue, dyspnea. Worse at night and with exertion.   PCP started her on anoro and nebulizer treatments with albuterol.  She is taking albuterol 6-8 times/day.   She is audibly wheezing   She has needed steroids multiple rounds since March 3-4 times at least. Just got off prednisone a month ago  She has been on advair in the past - did ok.   Triggers include strong smells, chemicals, perfumes.   She denies childhood respiratory disease - symptoms started well into adulthood in her late 1s when she was bearing children.   Social history:  Occupation: works as Actuary at [employer].  Exposures: lives at home with one kid - other kids are older. No pets at home.  Smoking history: never smoker, no passive exposure  Social History   Occupational History   Not on file  Tobacco Use   Smoking status: Never   Smokeless tobacco: Never  Vaping Use   Vaping Use: Never used  Substance and Sexual Activity   Alcohol use: No   Drug use: No   Sexual activity: Not Currently    Birth control/protection: None    Relevant family history:  Family History  Problem Relation Age of Onset   Diabetes Mother 42   Hypertension Mother    Other Mother        sepsis   Emphysema Father 71       smoked   Heart disease Father 22       MI   Emphysema Maternal  Aunt        smoked   Esophageal cancer Maternal Aunt    Lung cancer Maternal Aunt        smoked   Heart disease Maternal Grandfather    Heart disease Maternal Aunt    Heart disease Sister 20       MI   Cancer Brother    Cancer Maternal Uncle    Cancer Maternal Grandmother    Heart disease Sister    Aneurysm Brother    Colon cancer Neg Hx    Colon polyps Neg Hx    Rectal cancer Neg Hx    Stomach cancer Neg Hx     Past Medical History:  Diagnosis Date   Abdominal pain    Anemia    Asthma    Depression    Diarrhea    Heart murmur    Obesity    Plantar fasciitis    Shortness of breath    Vomiting     Past Surgical History:  Procedure Laterality Date   CHOLECYSTECTOMY  06/19/11   IR RADIOLOGY PERIPHERAL GUIDED IV START  10/23/2018   IR US GUIDE VASC ACCESS RIGHT  10/23/2018   TUBAL LIGATION       Physical Exam: Blood pressure 110/70, pulse  84, height '5\' 10"'$  (1.778 m), weight 257 lb 6.4 oz (116.8 kg), last menstrual period 04/28/2022, SpO2 100 %. Gen:    Short of breath, able to complete full sentences ENT:  no nasal polyps, mucus membranes moist Lungs:   tachypnic, wheezing, able to speak in full sentences CV:         Regular rate and rhythm; no murmurs, rubs, or gallops.  No pedal edema Abd:      + bowel sounds; soft, non-tender; no distension MSK: no acute synovitis of DIP or PIP joints, no mechanics hands.  Skin:      Warm and dry; no rashes Neuro: normal speech, no focal facial asymmetry Psych: alert and oriented x3, normal mood and affect   Data Reviewed/Medical Decision Making:  Independent interpretation of tests: Imaging:  Review of patient's CTPE study March 2023 images revealed no PE, small pericardial effusion. The patient's images have been independently reviewed by me.    PFTs:  Labs:  Lab Results  Component Value Date   WBC 6.9 05/04/2022   HGB 10.3 (L) 05/04/2022   HCT 34.2 (L) 05/04/2022   MCV 78.3 (L) 05/04/2022   PLT 196 05/04/2022    Lab Results  Component Value Date   NA 140 01/02/2022   K 4.0 01/02/2022   CL 106 01/02/2022   CO2 21 01/02/2022  Absolute Eosinophil count 200 in July 2023    Immunization status:  Immunization History  Administered Date(s) Administered   Influenza Split 07/29/2012, 07/30/2013   Influenza,inj,Quad PF,6+ Mos 10/05/2020   Influenza,inj,Quad PF,6-35 Mos 07/29/2019   Influenza,inj,quad, With Preservative 07/29/2012, 07/30/2013, 08/26/2017, 09/03/2018   Influenza-Unspecified 09/03/2018, 09/28/2021   PFIZER(Purple Top)SARS-COV-2 Vaccination 01/22/2020, 02/16/2020, 07/05/2020   Pneumococcal Polysaccharide-23 08/25/2018     I reviewed prior external note(s) from PCP, ED  I reviewed the result(s) of the labs and imaging as noted above.   I have ordered spirometry and feno at next visit   Assessment:  Moderate persistent asthma with acute exacerbation  Plan/Recommendations: Duoneb in office today. She is clearly in exacerbation but hopefully we can avoid a hospital stay.  Needs prednisone taper.  Stop anoro inhaler - switch to medium dose ICS-LABA with advair.  Will get spiro and feno at next visit once she is improved.   We discussed disease management and progression at length today regarding asthma.    Return to Care: Return in about 4 weeks (around 06/07/2022).  Lenice Llamas, MD Pulmonary and [place]  CC: Velna Ochs, MD

## 2022-06-06 NOTE — Progress Notes (Deleted)
[name]    [id number]    [birth year]  Primary Care 16, Enid Derry, MD (Inactive)  Referring Physician: No referring provider defined for this encounter. Reason for Consultation: cough and wheezing Date of Consultation: 06/06/2022  Chief complaint:   No chief complaint on file.    HPI: [name]  is a 46 y.o. woman here with moderate persistent asthma followed by Dr. Shearon Stalls for shortness of breath and wheezing.   In March 2023 she had covid infection and since then has had ongoing coughing, chest tightness, wheezing, fatigue, dyspnea. Worse at night and with exertion.   PCP started her on anoro and nebulizer treatments with albuterol.  She is taking albuterol 6-8 times/day.   She is audibly wheezing   She has needed steroids multiple rounds since March 3-4 times at least. Just got off prednisone a month ago  She has been on advair in the past - did ok.   Triggers include strong smells, chemicals, perfumes.   She denies childhood respiratory disease - symptoms started well into adulthood in her late 52s when she was bearing children.   Interval HPI: At last visit she was started on advair hfa 230 2 puffs BID, given prednisone taper  Social history:  Occupation: works as Actuary at [employer].  Exposures: lives at home with one kid - other kids are older. No pets at home.  Smoking history: never smoker, no passive exposure  Social History   Occupational History   Not on file  Tobacco Use   Smoking status: Never   Smokeless tobacco: Never  Vaping Use   Vaping Use: Never used  Substance and Sexual Activity   Alcohol use: No   Drug use: No   Sexual activity: Not Currently    Birth control/protection: None    Relevant family history:  Family History  Problem Relation Age of Onset   Diabetes Mother 43   Hypertension Mother    Other Mother        sepsis   Emphysema Father 51       smoked   Heart disease Father 36        MI   Emphysema Maternal Aunt        smoked   Esophageal cancer Maternal Aunt    Lung cancer Maternal Aunt        smoked   Heart disease Maternal Grandfather    Heart disease Maternal Aunt    Heart disease Sister 63       MI   Cancer Brother    Cancer Maternal Uncle    Cancer Maternal Grandmother    Heart disease Sister    Aneurysm Brother    Colon cancer Neg Hx    Colon polyps Neg Hx    Rectal cancer Neg Hx    Stomach cancer Neg Hx     Past Medical History:  Diagnosis Date   Abdominal pain    Anemia    Asthma    Depression    Diarrhea    Heart murmur    Obesity    Plantar fasciitis    Shortness of breath    Vomiting     Past Surgical History:  Procedure Laterality Date   CHOLECYSTECTOMY  06/19/11   IR RADIOLOGY PERIPHERAL GUIDED IV START  10/23/2018   IR US GUIDE VASC ACCESS RIGHT  10/23/2018   TUBAL LIGATION       Physical Exam: There were no vitals taken  for this visit. Gen:    Short of breath, able to complete full sentences ENT:  no nasal polyps, mucus membranes moist Lungs:   tachypnic, wheezing, able to speak in full sentences CV:         Regular rate and rhythm; no murmurs, rubs, or gallops.  No pedal edema Abd:      + bowel sounds; soft, non-tender; no distension MSK: no acute synovitis of DIP or PIP joints, no mechanics hands.  Skin:      Warm and dry; no rashes Neuro: normal speech, no focal facial asymmetry Psych: alert and oriented x3, normal mood and affect   Data Reviewed/Medical Decision Making:  Independent interpretation of tests: Imaging: CTA Chest 01/03/22 reviewed by me with small PCE, no PE, bronchial wall thickening  PFTs:  Labs:  Lab Results  Component Value Date   WBC 6.9 05/04/2022   HGB 10.3 (L) 05/04/2022   HCT 34.2 (L) 05/04/2022   MCV 78.3 (L) 05/04/2022   PLT 196 05/04/2022   Lab Results  Component Value Date   NA 140 01/02/2022   K 4.0 01/02/2022   CL 106 01/02/2022   CO2 21 01/02/2022  Absolute Eosinophil  count 200 in July 2023    Immunization status:  Immunization History  Administered Date(s) Administered   Influenza Split 07/29/2012, 07/30/2013   Influenza,inj,Quad PF,6+ Mos 10/05/2020   Influenza,inj,Quad PF,6-35 Mos 07/29/2019   Influenza,inj,quad, With Preservative 07/29/2012, 07/30/2013, 08/26/2017, 09/03/2018   Influenza-Unspecified 09/03/2018, 09/28/2021   PFIZER(Purple Top)SARS-COV-2 Vaccination 01/22/2020, 02/16/2020, 07/05/2020   Pneumococcal Polysaccharide-23 08/25/2018     I reviewed prior external note(s) from PCP, ED  I reviewed the result(s) of the labs and imaging as noted above.   I have ordered spirometry and feno at next visit   Assessment:  Moderate persistent asthma with acute exacerbation  Plan/Recommendations: Duoneb in office today. She is clearly in exacerbation but hopefully we can avoid a hospital stay.  Needs prednisone taper.  Stop anoro inhaler - switch to medium dose ICS-LABA with advair.  Will get spiro and feno at next visit once she is improved.   We discussed disease management and progression at length today regarding asthma.    Return to Care: No follow-ups on file.  Lenice Llamas, MD Pulmonary and [place]  CC: No ref. provider found

## 2022-06-07 ENCOUNTER — Ambulatory Visit: Payer: 59 | Admitting: Student

## 2022-06-15 ENCOUNTER — Ambulatory Visit (INDEPENDENT_AMBULATORY_CARE_PROVIDER_SITE_OTHER): Payer: 59 | Admitting: Internal Medicine

## 2022-06-15 ENCOUNTER — Encounter: Payer: Self-pay | Admitting: Internal Medicine

## 2022-06-15 VITALS — BP 120/90 | HR 89 | Ht 70.0 in | Wt 256.6 lb

## 2022-06-15 DIAGNOSIS — J387 Other diseases of larynx: Secondary | ICD-10-CM | POA: Diagnosis not present

## 2022-06-15 DIAGNOSIS — R053 Chronic cough: Secondary | ICD-10-CM

## 2022-06-15 MED ORDER — PROMETHAZINE-DM 6.25-15 MG/5ML PO SYRP: 5.0000 mL | ORAL_SOLUTION | Freq: Four times a day (QID) | ORAL | 0 refills | Status: DC | PRN

## 2022-06-15 MED ORDER — PANTOPRAZOLE SODIUM 40 MG PO TBEC: 40.0000 mg | DELAYED_RELEASE_TABLET | Freq: Two times a day (BID) | ORAL | 3 refills | Status: DC

## 2022-06-15 NOTE — Patient Instructions (Addendum)
Please schedule follow up scheduled with myself in 2 months.  If my schedule is not open yet, we will contact you with a reminder closer to that time. Please call [phone] if you haven't heard from [place] a month before.   Before your next visit I would like you to have: Follow up with ENT - we will call you to schedule this referral.   I am worried you have an irritable larynx and vocal cords and this is causing your cough. If you don't get better with prednisone it's not asthma.   I sent cough syrup to help you sleep at night to your pharmacy.   Increase acid reflux medicine to twice a day - sent to your pharmacy  Can stop the albuterol nebs since they aren't helping  Sip on warm liquids like water and herbal tea with honey. Honey will help suppress the cough. Avoid talking more than you need to

## 2022-06-15 NOTE — Progress Notes (Signed)
[name]    [id number]    [birth year]  Primary Care 23, Enid Derry, MD (Inactive) Date of Appointment: 06/15/2022 Established Patient Visit  Chief complaint:   Chief Complaint  Patient presents with   Follow-up    Follow-up: SOB, cough, chest tightness     HPI: [name]  is a 46 y.o. woman with moderate persistent asthma.  Interval Updates: She is here for follow up after prednisone taper, advair, albuterol.   She did not feel the prednisone helped her cough. Albuterol nebs and advair are not helping her cough. She feels about the same.   The cough is worse at night. Taking promethazine syrup to help with sleep.   No post nasal drainage. Does have reflux.   I have reviewed the patient's family social and past medical history and updated as appropriate.   Past Medical History:  Diagnosis Date   Abdominal pain    Anemia    Asthma    Depression    Diarrhea    Heart murmur    Obesity    Plantar fasciitis    Shortness of breath    Vomiting     Past Surgical History:  Procedure Laterality Date   CHOLECYSTECTOMY  06/19/11   IR RADIOLOGY PERIPHERAL GUIDED IV START  10/23/2018   IR US GUIDE VASC ACCESS RIGHT  10/23/2018   TUBAL LIGATION      Family History  Problem Relation Age of Onset   Diabetes Mother 64   Hypertension Mother    Other Mother        sepsis   Emphysema Father 26       smoked   Heart disease Father 37       MI   Emphysema Maternal Aunt        smoked   Esophageal cancer Maternal Aunt    Lung cancer Maternal Aunt        smoked   Heart disease Maternal Grandfather    Heart disease Maternal Aunt    Heart disease Sister 31       MI   Cancer Brother    Cancer Maternal Uncle    Cancer Maternal Grandmother    Heart disease Sister    Aneurysm Brother    Colon cancer Neg Hx    Colon polyps Neg Hx    Rectal cancer Neg Hx    Stomach cancer Neg Hx     Social History   Occupational History   Not  on file  Tobacco Use   Smoking status: Never   Smokeless tobacco: Never  Vaping Use   Vaping Use: Never used  Substance and Sexual Activity   Alcohol use: No   Drug use: No   Sexual activity: Not Currently    Birth control/protection: None     Physical Exam: Blood pressure (!) 120/90, pulse 89, height '5\' 10"'$  (1.778 m), weight 256 lb 9.6 oz (116.4 kg), SpO2 98 %.  Gen:      No acute distress, frequent coughing, hoarse voice ENT:  no nasal polyps, mucus membranes moist no thrush Lungs:    No increased respiratory effort, symmetric chest wall excursion, clear to auscultation bilaterally, hoarse expiratory wheeze over anterior neck  CV:         Regular rate and rhythm; no murmurs, rubs, or gallops.  No pedal edema   Data Reviewed: Imaging:   PFTs:      No data to display  Labs:  Immunization status: Immunization History  Administered Date(s) Administered   Influenza Split 07/29/2012, 07/30/2013   Influenza,inj,Quad PF,6+ Mos 10/05/2020   Influenza,inj,Quad PF,6-35 Mos 07/29/2019   Influenza,inj,quad, With Preservative 07/29/2012, 07/30/2013, 08/26/2017, 09/03/2018   Influenza-Unspecified 09/03/2018, 09/28/2021   PFIZER(Purple Top)SARS-COV-2 Vaccination 01/22/2020, 02/16/2020, 07/05/2020   Pneumococcal Polysaccharide-23 08/25/2018    External Records Personally Reviewed:   Assessment:  Chronic cough Possible asthma GERD Irritable Larynx  Plan/Recommendations: If her symptoms are not improving with prednisone and albuterol, this is not asthma. 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  Increase protonix to 40 mg BID.  Urgent referral to ENT for cough.  Will refill cough syrup to help her sleep at night  Can continue advair but would stop all the albuterol nebs - likely worsening irritation  Return to Care: Return in about 2 months (around 08/15/2022).   Lenice Llamas, MD Pulmonary and [place]

## 2022-06-22 ENCOUNTER — Encounter: Payer: 59 | Admitting: Obstetrics & Gynecology

## 2022-06-28 NOTE — Congregational Nurse Program (Signed)
Brief encounter with member to explain services of Congregational Nurse and introduce myself to her.  Vinnie Langton, RN, [place] [facility], [phone]

## 2022-07-23 ENCOUNTER — Other Ambulatory Visit (HOSPITAL_COMMUNITY)
Admission: RE | Admit: 2022-07-23 | Discharge: 2022-07-23 | Disposition: A | Discharge status: Home / Self Care | Payer: 59 | Source: Ambulatory Visit | Attending: Obstetrics & Gynecology | Admitting: Obstetrics & Gynecology

## 2022-07-23 ENCOUNTER — Other Ambulatory Visit: Payer: Self-pay

## 2022-07-23 ENCOUNTER — Ambulatory Visit (INDEPENDENT_AMBULATORY_CARE_PROVIDER_SITE_OTHER): Payer: 59 | Admitting: Obstetrics & Gynecology

## 2022-07-23 ENCOUNTER — Encounter: Payer: Self-pay | Admitting: Obstetrics & Gynecology

## 2022-07-23 VITALS — BP 124/79 | HR 94 | Wt 265.0 lb

## 2022-07-23 DIAGNOSIS — Z1231 Encounter for screening mammogram for malignant neoplasm of breast: Secondary | ICD-10-CM

## 2022-07-23 DIAGNOSIS — N939 Abnormal uterine and vaginal bleeding, unspecified: Secondary | ICD-10-CM | POA: Insufficient documentation

## 2022-07-23 DIAGNOSIS — F419 Anxiety disorder, unspecified: Secondary | ICD-10-CM | POA: Diagnosis not present

## 2022-07-23 DIAGNOSIS — A5901 Trichomonal vulvovaginitis: Secondary | ICD-10-CM

## 2022-07-23 DIAGNOSIS — Z01419 Encounter for gynecological examination (general) (routine) without abnormal findings: Secondary | ICD-10-CM | POA: Diagnosis not present

## 2022-07-23 HISTORY — DX: Trichomonal vulvovaginitis: A59.01

## 2022-07-23 NOTE — Patient Instructions (Signed)
Take Ibuprofen before next appointment

## 2022-07-23 NOTE — Progress Notes (Signed)
GYNECOLOGY ANNUAL PREVENTATIVE CARE ENCOUNTER NOTE  History:     [name] is a 46 y.o. [phone] female here for a routine annual gynecologic exam. Current complaints: heavy uterine bleeding since April 25, 2022. Patient endorses wearing pads and pampers to sleep due to overflow of blood. Patient noticed that increased activity at work increases the bleeding amount. Patient has a hx of irregular periods and was on Megace for "years" and that stopped her periods but in 2018 patient stopped taking the Megace due to significant side effects. Patient no longer wishes to take medications and would like to have a hysterectomy. Patient endorses abdominal pain, unilateral headaches, N/V, diarrhea, dizziness, and pain in legs, hands, and feet. Pain in extremities is a 8/10 and described as aching and sharp. All previously mentioned symptoms occur during menstruation. As well as hot flashes and a decreased appetite after a 20lb weight loss in February 2023 after COVID-19 infection. No symptoms are currently present during this visit. Denies abnormal vaginal discharge, problems with intercourse or other gynecologic concerns.    Gynecologic History LMP: currently present since April 25, 2022 Contraception: condoms Last Pap: 11/29/2016. Result was normal with negative HPV Last Mammogram: 05/23/2020.  Result was normal Last Colonoscopy: 08/11/2020.  Result was significant for medium-mouthed diverticula, a 3 mm sessile polyp that was removed with a cold snare, internal hemorrhoids, exam was otherwise without abnormality.   Obstetric History OB History  Gravida Para Term Preterm AB Living  '5 4 3 1 1 4  '$ SAB IAB Ectopic Multiple Live Births  1       4    # Outcome Date GA Lbr Len/2nd Weight Sex Delivery Anes PTL Lv  5 Term 2012    M Vag-Spont   LIV  4 SAB 2011          3 Preterm 2003    F Vag-Spont   LIV  2 Term 1999    F Vag-Spont   LIV  1 Term 1993    M Vag-Spont   LIV    Past Medical History:   Diagnosis Date   Abdominal pain    Anemia    Asthma    Depression    Diarrhea    Heart murmur    Obesity    Plantar fasciitis    Shortness of breath    Vomiting     Past Surgical History:  Procedure Laterality Date   CHOLECYSTECTOMY  06/19/11   IR RADIOLOGY PERIPHERAL GUIDED IV START  10/23/2018   IR US GUIDE VASC ACCESS RIGHT  10/23/2018   TUBAL LIGATION      Current Outpatient Medications on File Prior to Visit  Medication Sig Dispense Refill   albuterol (PROVENTIL) (2.5 MG/3ML) 0.083% nebulizer solution Take 3 mLs (2.5 mg total) by nebulization every 6 (six) hours as needed for wheezing or shortness of breath. 75 mL 1   albuterol (VENTOLIN HFA) 108 (90 Base) MCG/ACT inhaler Inhale 2 puffs into the lungs every 4 (four) hours as needed for wheezing or shortness of breath. 18 g 1   clonazePAM (KLONOPIN) 0.5 MG tablet Take 1 tablet (0.5 mg total) by mouth 2 (two) times daily as needed for anxiety. 20 tablet 0   fluticasone-salmeterol (ADVAIR HFA) 230-21 MCG/ACT inhaler Inhale 2 puffs into the lungs 2 (two) times daily. 1 each 12   pantoprazole (PROTONIX) 40 MG tablet Take 1 tablet (40 mg total) by mouth 2 (two) times daily before a meal. 180 tablet 3  promethazine-dextromethorphan (PROMETHAZINE-DM) 6.25-15 MG/5ML syrup Take 5 mLs by mouth 4 (four) times daily as needed for cough. 120 mL 0   sertraline (ZOLOFT) 25 MG tablet TAKE 1 TABLET(25 MG) BY MOUTH DAILY (Patient taking differently: Take 25 mg by mouth daily.) 90 tablet 0   furosemide (LASIX) 40 MG tablet Take 1 tablet (40 mg total) by mouth daily. Take 1 tablet daily for 3 days (Patient not taking: Reported on 07/23/2022) 30 tablet 0   Insulin Pen Needle (BD PEN NEEDLE NANO U/F) 32G X 4 MM MISC 1 each by Does not apply route at bedtime. (Patient not taking: Reported on 07/23/2022) 30 each 2   potassium chloride SA (KLOR-CON M20) 20 MEQ tablet Take 1 tablet daily for 3 days. (Patient not taking: Reported on 07/23/2022) 30 tablet 0    Current Facility-Administered Medications on File Prior to Visit  Medication Dose Route Frequency Provider Last Rate Last Admin   ipratropium-albuterol (DUONEB) 0.5-2.5 (3) MG/3ML nebulizer solution 3 mL  3 mL Nebulization Q4H PRN Spero Geralds, MD   3 mL at 05/10/22 1132    No Known Allergies  Social History:  reports that she has never smoked. She has never used smokeless tobacco. She reports that she does not drink alcohol and does not use drugs.  Family History  Problem Relation Age of Onset   Diabetes Mother 71   Hypertension Mother    Other Mother        sepsis   Emphysema Father 66       smoked   Heart disease Father 43       MI   Emphysema Maternal Aunt        smoked   Esophageal cancer Maternal Aunt    Lung cancer Maternal Aunt        smoked   Heart disease Maternal Grandfather    Heart disease Maternal Aunt    Heart disease Sister 67       MI   Cancer Brother    Cancer Maternal Uncle    Cancer Maternal Grandmother    Heart disease Sister    Aneurysm Brother    Colon cancer Neg Hx    Colon polyps Neg Hx    Rectal cancer Neg Hx    Stomach cancer Neg Hx     The following portions of the patient's history were reviewed and updated as appropriate: allergies, current medications, past family history, past medical history, past social history, past surgical history and problem list.  Review of Systems Pertinent items noted in HPI and remainder of comprehensive ROS otherwise negative.  Physical Exam:  BP 124/79   Pulse 94   Wt 265 lb (120.2 kg)   BMI 38.02 kg/m  CONSTITUTIONAL: Well-developed, well-nourished female in no acute distress.  HENT:  Normocephalic, atraumatic, External right and left ear normal.  EYES: Conjunctivae and EOM are normal. Pupils are equal, round, and reactive to light. No scleral icterus.  NECK: Normal range of motion, supple, no masses.  Normal thyroid.  SKIN: Skin is warm and dry. No rash noted. Not diaphoretic. No erythema. No  pallor. MUSCULOSKELETAL: Normal range of motion. No tenderness.  No cyanosis, clubbing, or edema. NEUROLOGIC: Alert and oriented to person, place, and time. Normal reflexes, muscle tone coordination.  PSYCHIATRIC: Normal mood and affect. Normal behavior. Normal judgment and thought content. CARDIOVASCULAR: Normal heart rate noted, regular rhythm RESPIRATORY: Clear to auscultation bilaterally. Effort and breath sounds normal, no problems with respiration noted. BREASTS: Symmetric in size.  No masses, tenderness, skin changes, nipple drainage, or lymphadenopathy bilaterally. Performed in the presence of a chaperone. ABDOMEN: Soft, no distention noted.  No tenderness, rebound or guarding.  PELVIC: Normal appearing external genitalia and urethral meatus; normal appearing vaginal mucosa and cervix.  No abnormal vaginal discharge noted.  Pap smear obtained.  Normal uterine size, no other palpable masses, no uterine or adnexal tenderness.  Performed in the presence of a chaperone.   Assessment and Plan:   Will follow up results of pap smear and manage accordingly. Ultrasound scheduled Will return for endometrial biopsy Mammogram scheduled Colon cancer screening is up to date.  Routine preventative health maintenance measures emphasized. Please refer to After Visit Summary for other counseling recommendations.     Glorious Peach, Medical Student 07/23/2022, 3:55 pm

## 2022-07-23 NOTE — Progress Notes (Signed)
Patient is here with AUB, desires hysterectomy. Also needs annual exam and pap smear.  See other note for further details.  Verita Schneiders, MD, [place] for [employer], [place]

## 2022-07-24 NOTE — BH Specialist Note (Signed)
Integrated Behavioral Health Initial In-Person Visit  MRN: [id number] Name: [name]  Number of [facility] Clinician visits: 1- Initial Visit  Session Start time: 6295    Session End time: 2841  Total time in minutes: 26   Types of Service: Individual psychotherapy  Interpretor:No. Interpretor Name and Language: n/a   Warm Hand Off Completed.        Subjective: [name] is a 46 y.o. female accompanied by  n/a Patient was referred by Verita Schneiders, MD for anxiety. Patient reports the following symptoms/concerns: Increased symptoms of depression and anxiety attributed to continual bleeding and pelvic pain from fibroids that is negatively affecting her quality of life at home and work (postponing wedding and missing work). Pt is requesting hysterectomy.  Duration of problem: Increasing over time; Severity of problem:  moderately severe  Objective: Mood: Anxious and Affect: Appropriate Risk of harm to self or others: No plan to harm self or others  Life Context: Family and Social: Pt has 26yo son at home; good social support School/Work: Working as able; chronic bleeding affecting work Self-Care: Writer Life Changes: fibroids affecting quality of life  Patient and/or Family's Strengths/Protective Factors: Social connections, Concrete supports in place (healthy food, safe environments, etc.), and Sense of purpose  Goals Addressed: Patient will: Reduce symptoms of: anxiety, depression, and stress  Progress towards Goals: Ongoing  Interventions: Interventions utilized: Psychoeducation and/or Health Education and Supportive Reflection  Standardized Assessments completed: Not Needed  Patient and/or Family Response: Patient agrees with treatment plan.   Patient Centered Plan: Patient is on the following Treatment Plan(s):  IBH  Assessment: Patient currently experiencing Adjustment disorder with depressed and  anxious mood, related to physical health condition   Patient may benefit from psychoeducation and brief therapeutic interventions regarding coping with symptoms of anxiety and depression related to physical health .  Plan: Follow up with behavioral health clinician on : Call Anab Vivar at [phone], as needed. Behavioral recommendations:  -Continue self-advocacy for personal healthcare -Continue taking Zoloft and Klonopin as prescribed to help manage symptoms Referral(s): [facility] (In Clinic)  [place], [place]     07/23/2022    4:17 PM 03/27/2022    3:27 PM 11/17/2021   10:52 AM 05/09/2020    3:17 PM 12/28/2019   11:46 AM  Depression screen PHQ 2/9  Decreased Interest 1 3 0 0 0  Down, Depressed, Hopeless 1 3 0 0 0  PHQ - 2 Score 2 6 0 0 0  Altered sleeping 3 2     Tired, decreased energy 3 3     Change in appetite 3 3     Feeling bad or failure about yourself  1 0     Trouble concentrating 1 0     Moving slowly or fidgety/restless 1 0     Suicidal thoughts 0 0     PHQ-9 Score 14 14     Difficult doing work/chores  Somewhat difficult         07/23/2022    4:18 PM 12/31/2016   10:18 AM  GAD 7 : Generalized Anxiety Score  Nervous, Anxious, on Edge 3 0  Control/stop worrying 2 0  Worry too much - different things 2 0  Trouble relaxing 3 1  Restless 1 0  Easily annoyed or irritable 2 0  Afraid - awful might happen 1 0  Total GAD 7 Score 14 1

## 2022-07-26 ENCOUNTER — Encounter: Payer: Self-pay | Admitting: Obstetrics & Gynecology

## 2022-07-26 ENCOUNTER — Ambulatory Visit
Admission: RE | Admit: 2022-07-26 | Discharge: 2022-07-26 | Disposition: A | Discharge status: Home / Self Care | Payer: 59 | Source: Ambulatory Visit | Attending: Obstetrics & Gynecology | Admitting: Obstetrics & Gynecology

## 2022-07-26 DIAGNOSIS — N939 Abnormal uterine and vaginal bleeding, unspecified: Secondary | ICD-10-CM | POA: Insufficient documentation

## 2022-07-26 LAB — CYTOLOGY - PAP
Comment: NEGATIVE
Diagnosis: NEGATIVE
High risk HPV: NEGATIVE

## 2022-07-26 MED ORDER — METRONIDAZOLE 500 MG PO TABS: 500.0000 mg | ORAL_TABLET | Freq: Two times a day (BID) | ORAL | 0 refills | Status: AC

## 2022-07-26 NOTE — Progress Notes (Signed)
Patient has trichomonal vaginitis noted on her otherwise normal pap smear.  This can also cause/worsen abnormal uterine bleeding.  Recommend testing for other STIs, also needs to let partner(s) know so the partner(s) can get testing and treatment. Patient and sex partner(s) should abstain from unprotected sexual activity for seven days after everyone receives appropriate treatment.  Metronidazole was prescribed for patient.  Patient will need to return in about 4 weeks after treatment for repeat test of cure.  Please call to inform patient of results and recommendations, and advise to pick up prescription and take as directed.  Please advise patient to practice safe sex at all times.  Verita Schneiders, MD

## 2022-07-26 NOTE — Addendum Note (Signed)
Addended by: Verita Schneiders A on: 07/26/2022 11:52 AM   Modules accepted: Orders

## 2022-07-27 ENCOUNTER — Telehealth: Payer: Self-pay

## 2022-07-27 NOTE — Telephone Encounter (Addendum)
-----   Message from Osborne Oman, MD sent at 07/26/2022 11:52 AM EDT ----- Patient has trichomonal vaginitis noted on her otherwise normal pap smear.  This can also cause/worsen abnormal uterine bleeding.  Recommend testing for other STIs, also needs to let partner(s) know so the partner(s) can get testing and treatment. Patient and sex partner(s) should abstain from unprotected sexual activity for seven days after everyone receives appropriate treatment.  Metronidazole was prescribed for patient.  Patient will need to return in about 4 weeks after treatment for repeat test of cure.  Please call to inform patient of results and recommendations, and advise to pick up prescription and take as directed.  Please advise patient to practice safe sex at all times.  Verita Schneiders, MD   --------------------------------  Called patient with results and recommendation.

## 2022-08-07 ENCOUNTER — Ambulatory Visit (INDEPENDENT_AMBULATORY_CARE_PROVIDER_SITE_OTHER): Payer: 59 | Admitting: Clinical

## 2022-08-07 DIAGNOSIS — F4323 Adjustment disorder with mixed anxiety and depressed mood: Secondary | ICD-10-CM

## 2022-08-07 NOTE — Patient Instructions (Signed)
[facility] [address] [place], [place] [address] [phone] (main office) [phone] (Veta Dambrosia's office)  /Emotional Wellbeing Apps and Websites Here are a few free apps meant to help you to help yourself.  To find, try searching on the internet to see if the app is offered on Apple/Android devices. If your first choice doesn't come up on your device, the good news is that there are many choices! Play around with different apps to see which ones are helpful to you.    Calm This is an app meant to help increase calm feelings. Includes info, strategies, and tools for tracking your feelings.      Calm Harm  This app is meant to help with self-harm. Provides many 5-minute or 15-min coping strategies for doing instead of hurting yourself.       Healthy Minds Health Minds is a problem-solving tool to help deal with emotions and cope with stress you encounter wherever you are.      MindShift This app can help people cope with anxiety. Rather than trying to avoid anxiety, you can make an important shift and face it.      MY3  MY3 features a support system, safety plan and resources with the goal of offering a tool to use in a time of need.       My Life My Voice  This mood journal offers a simple solution for tracking your thoughts, feelings and moods. Animated emoticons can help identify your mood.       Relax Melodies Designed to help with sleep, on this app you can mix sounds and meditations for relaxation.      Smiling Mind Smiling Mind is meditation made easy: it's a simple tool that helps put a smile on your mind.        Stop, Breathe & Think  A friendly, simple guide for people through meditations for mindfulness and compassion.  Stop, Breathe and Think Kids Enter your current feelings and choose a "mission" to help you cope. Offers videos for certain moods instead of just sound recordings.       Team  Orange The goal of this tool is to help teens change how they think, act, and react. This app helps you focus on your own good feelings and experiences.      The Virtual Hope Box The Virtual Hope Box (VHB) contains simple tools to help patients with coping, relaxation, distraction, and positive thinking.     

## 2022-08-16 ENCOUNTER — Ambulatory Visit: Payer: 59 | Admitting: Internal Medicine

## 2022-08-22 ENCOUNTER — Ambulatory Visit: Payer: 59

## 2022-08-22 DIAGNOSIS — Z23 Encounter for immunization: Secondary | ICD-10-CM

## 2022-09-03 ENCOUNTER — Ambulatory Visit: Payer: 59 | Admitting: Obstetrics & Gynecology

## 2022-10-02 ENCOUNTER — Ambulatory Visit: Payer: 59

## 2022-10-19 ENCOUNTER — Ambulatory Visit
Admission: RE | Admit: 2022-10-19 | Discharge: 2022-10-19 | Disposition: A | Discharge status: Home / Self Care | Payer: 59 | Source: Ambulatory Visit | Attending: Obstetrics & Gynecology | Admitting: Obstetrics & Gynecology

## 2022-10-19 DIAGNOSIS — Z1231 Encounter for screening mammogram for malignant neoplasm of breast: Secondary | ICD-10-CM

## 2022-10-20 ENCOUNTER — Telehealth: Payer: Self-pay | Admitting: Urgent Care

## 2022-10-20 DIAGNOSIS — J4551 Severe persistent asthma with (acute) exacerbation: Secondary | ICD-10-CM

## 2022-10-20 NOTE — Progress Notes (Signed)
Because of the severity of your symptoms, I feel your condition warrants further evaluation and I recommend that you be seen in a face to face visit.   NOTE: There will be NO CHARGE for this eVisit   If you are having a true medical emergency please call 911.      For an urgent face to face visit, [facility] has seven urgent care centers for your convenience:     [facility] [place] Get Driving Directions [phone] [place], [place] [address]    [facility]) Get Driving Directions [phone] [place], [place] [address]  [facility] ([place]) Get Driving Directions [phone] [address] [place],  [place]  [address]  [facility] at [address] [place] Get Driving Directions  [phone] [address] [place],  [place] [address]   [facility] Get Driving Directions [phone] [address], [place], [place] [address]   [facility] Get Driving Directions  [phone] [address] [place], [place] [address]   [facility] [place] Get Driving Directions [phone] [address], [place],  [address]  Your MyChart E-visit questionnaire answers were reviewed by a board certified advanced clinical practitioner to complete your personal care plan based on your specific symptoms.  Thank you for using e-Visits.

## 2022-10-26 ENCOUNTER — Ambulatory Visit: Payer: 59 | Admitting: Obstetrics and Gynecology

## 2022-10-27 ENCOUNTER — Telehealth: Payer: 59 | Admitting: Emergency Medicine

## 2022-10-27 DIAGNOSIS — R052 Subacute cough: Secondary | ICD-10-CM | POA: Diagnosis not present

## 2022-10-27 MED ORDER — DOXYCYCLINE HYCLATE 100 MG PO CAPS: 100.0000 mg | ORAL_CAPSULE | Freq: Two times a day (BID) | ORAL | 0 refills | Status: DC

## 2022-10-27 MED ORDER — BENZONATATE 100 MG PO CAPS: 100.0000 mg | ORAL_CAPSULE | Freq: Two times a day (BID) | ORAL | 0 refills | Status: DC | PRN

## 2022-10-27 NOTE — Patient Instructions (Signed)
[name], thank you for joining Montine Circle, PA-C for today's virtual visit.  While this provider is not your primary care provider (PCP), if your PCP is located in our provider database this encounter information will be shared with them immediately following your visit.   [facility] account gives you access to today's visit and all your visits, tests, and labs performed at [facility] " click here if you don't have a [facility] account or go to [email or web address]  Consent: (Patient) [name] provided verbal consent for this virtual visit at the beginning of the encounter.  Current Medications:  Current Outpatient Medications:    benzonatate (TESSALON) 100 MG capsule, Take 1 capsule (100 mg total) by mouth 2 (two) times daily as needed for cough., Disp: 20 capsule, Rfl: 0   doxycycline (VIBRAMYCIN) 100 MG capsule, Take 1 capsule (100 mg total) by mouth 2 (two) times daily., Disp: 20 capsule, Rfl: 0   albuterol (PROVENTIL) (2.5 MG/3ML) 0.083% nebulizer solution, Take 3 mLs (2.5 mg total) by nebulization every 6 (six) hours as needed for wheezing or shortness of breath., Disp: 75 mL, Rfl: 1   albuterol (VENTOLIN HFA) 108 (90 Base) MCG/ACT inhaler, Inhale 2 puffs into the lungs every 4 (four) hours as needed for wheezing or shortness of breath., Disp: 18 g, Rfl: 1   clonazePAM (KLONOPIN) 0.5 MG tablet, Take 1 tablet (0.5 mg total) by mouth 2 (two) times daily as needed for anxiety., Disp: 20 tablet, Rfl: 0   fluticasone-salmeterol (ADVAIR HFA) 230-21 MCG/ACT inhaler, Inhale 2 puffs into the lungs 2 (two) times daily., Disp: 1 each, Rfl: 12   furosemide (LASIX) 40 MG tablet, Take 1 tablet (40 mg total) by mouth daily. Take 1 tablet daily for 3 days (Patient not taking: Reported on 07/23/2022), Disp: 30 tablet, Rfl: 0   Insulin Pen Needle (BD PEN NEEDLE NANO U/F) 32G X 4 MM MISC, 1 each by Does not apply route at bedtime. (Patient not  taking: Reported on 07/23/2022), Disp: 30 each, Rfl: 2   pantoprazole (PROTONIX) 40 MG tablet, Take 1 tablet (40 mg total) by mouth 2 (two) times daily before a meal., Disp: 180 tablet, Rfl: 3   potassium chloride SA (KLOR-CON M20) 20 MEQ tablet, Take 1 tablet daily for 3 days. (Patient not taking: Reported on 07/23/2022), Disp: 30 tablet, Rfl: 0   promethazine-dextromethorphan (PROMETHAZINE-DM) 6.25-15 MG/5ML syrup, Take 5 mLs by mouth 4 (four) times daily as needed for cough., Disp: 120 mL, Rfl: 0   sertraline (ZOLOFT) 25 MG tablet, TAKE 1 TABLET(25 MG) BY MOUTH DAILY (Patient taking differently: Take 25 mg by mouth daily.), Disp: 90 tablet, Rfl: 0  Current Facility-Administered Medications:    ipratropium-albuterol (DUONEB) 0.5-2.5 (3) MG/3ML nebulizer solution 3 mL, 3 mL, Nebulization, Q4H PRN, Spero Geralds, MD, 3 mL at 05/10/22 1132   Medications ordered in this encounter:  Meds ordered this encounter  Medications   doxycycline (VIBRAMYCIN) 100 MG capsule    Sig: Take 1 capsule (100 mg total) by mouth 2 (two) times daily.    Dispense:  20 capsule    Refill:  0    Order Specific Question:   Supervising Provider    Answer:   Chase Picket [[id number]]   benzonatate (TESSALON) 100 MG capsule    Sig: Take 1 capsule (100 mg total) by mouth 2 (two) times daily as needed for cough.    Dispense:  20 capsule    Refill:  0    Order Specific Question:   Supervising Provider    Answer:   Chase Picket [[id number]]     *If you need refills on other medications prior to your next appointment, please contact your pharmacy*  Follow-Up: Call back or seek an in-person evaluation if the symptoms worsen or if the condition fails to improve as anticipated.  [facility] [phone]  Other Instructions    If you have been instructed to have an in-person evaluation today at a local Urgent Care facility, please use the link below. It will take you to a list of all of our available  [facility], including address, phone number and hours of operation. Please do not delay care.  [facility]  If you or a family member do not have a primary care provider, use the link below to schedule a visit and establish care. When you choose a [facility] primary care physician or advanced practice provider, you gain a long-term partner in health. Find a Primary Care Provider  Learn more about [facility]'s in-office and virtual care options: [facility]

## 2022-10-27 NOTE — Progress Notes (Signed)
Virtual Visit Consent   Sanda Dejoy, you are scheduled for a virtual visit with a [facility] provider today. Just as with appointments in the office, your consent must be obtained to participate. Your consent will be active for this visit and any virtual visit you may have with one of our providers in the next 365 days. If you have a MyChart account, a copy of this consent can be sent to you electronically.  As this is a virtual visit, video technology does not allow for your provider to perform a traditional examination. This may limit your provider's ability to fully assess your condition. If your provider identifies any concerns that need to be evaluated in person or the need to arrange testing (such as labs, EKG, etc.), we will make arrangements to do so. Although advances in technology are sophisticated, we cannot ensure that it will always work on either your end or our end. If the connection with a video visit is poor, the visit may have to be switched to a telephone visit. With either a video or telephone visit, we are not always able to ensure that we have a secure connection.  By engaging in this virtual visit, you consent to the provision of healthcare and authorize for your insurance to be billed (if applicable) for the services provided during this visit. Depending on your insurance coverage, you may receive a charge related to this service.  I need to obtain your verbal consent now. Are you willing to proceed with your visit today? Jimmey Ralph has provided verbal consent on 10/27/2022 for a virtual visit (video or telephone). Montine Circle, PA-C  Date: 10/27/2022 11:00 AM  Virtual Visit via Video Note   I, Montine Circle, connected with  [name]  ([id number], March 31, 1976) on 10/27/22 at 11:00 AM EST by a video-enabled telemedicine application and verified that I am speaking with the correct person using two identifiers.  Location: Patient: Virtual Visit  Location Patient: Home Provider: Virtual Visit Location Provider: Home Office   I discussed the limitations of evaluation and management by telemedicine and the availability of in person appointments. The patient expressed understanding and agreed to proceed.    History of Present Illness: [name] is a 46 y.o. who identifies as a female who was assigned female at birth, and is being seen today for cough for a couple of weeks.  She states that she is starting to having worsening cough.  Now having coughing spells that have caused vomiting.  Has been productive cough.  Denies pregnancy or breastfeeding.  HPI: HPI  Problems:  Patient Active Problem List   Diagnosis Date Noted   Abnormal uterine bleeding (AUB) 07/26/2022   Trichomonal vaginitis 07/23/2022   Acute asthma exacerbation 12/24/2021   Sepsis due to COVID-19 ([facility]) 12/24/2021   GERD without esophagitis 12/24/2021   Depression, unspecified 12/24/2021   Moderate persistent asthma with acute exacerbation 11/17/2021   Other fatigue 11/17/2021   Polydipsia 11/17/2021   Polyuria 11/17/2021   Iron deficiency anemia due to chronic blood loss 10/05/2020   Dysmenorrhea 10/05/2020   Uterine leiomyoma 10/05/2020   Need for influenza vaccination 10/05/2020   BMI 38.0-38.9,adult 10/05/2020   Gastroesophageal reflux disease 10/05/2020   Encounter for health maintenance examination in adult 05/09/2020   Encounter for screening mammogram for malignant neoplasm of breast 05/09/2020   Screen for colon cancer 05/09/2020   Vaccine counseling 05/09/2020   Family history of DVT 03/21/2020   Plantar fasciitis 12/28/2019  Chronic asthma 07/31/2013   Anemia 01/01/2012    Allergies: No Known Allergies Medications:  Current Outpatient Medications:    albuterol (PROVENTIL) (2.5 MG/3ML) 0.083% nebulizer solution, Take 3 mLs (2.5 mg total) by nebulization every 6 (six) hours as needed for wheezing or shortness of breath., Disp: 75 mL, Rfl:  1   albuterol (VENTOLIN HFA) 108 (90 Base) MCG/ACT inhaler, Inhale 2 puffs into the lungs every 4 (four) hours as needed for wheezing or shortness of breath., Disp: 18 g, Rfl: 1   clonazePAM (KLONOPIN) 0.5 MG tablet, Take 1 tablet (0.5 mg total) by mouth 2 (two) times daily as needed for anxiety., Disp: 20 tablet, Rfl: 0   fluticasone-salmeterol (ADVAIR HFA) 230-21 MCG/ACT inhaler, Inhale 2 puffs into the lungs 2 (two) times daily., Disp: 1 each, Rfl: 12   furosemide (LASIX) 40 MG tablet, Take 1 tablet (40 mg total) by mouth daily. Take 1 tablet daily for 3 days (Patient not taking: Reported on 07/23/2022), Disp: 30 tablet, Rfl: 0   Insulin Pen Needle (BD PEN NEEDLE NANO U/F) 32G X 4 MM MISC, 1 each by Does not apply route at bedtime. (Patient not taking: Reported on 07/23/2022), Disp: 30 each, Rfl: 2   pantoprazole (PROTONIX) 40 MG tablet, Take 1 tablet (40 mg total) by mouth 2 (two) times daily before a meal., Disp: 180 tablet, Rfl: 3   potassium chloride SA (KLOR-CON M20) 20 MEQ tablet, Take 1 tablet daily for 3 days. (Patient not taking: Reported on 07/23/2022), Disp: 30 tablet, Rfl: 0   promethazine-dextromethorphan (PROMETHAZINE-DM) 6.25-15 MG/5ML syrup, Take 5 mLs by mouth 4 (four) times daily as needed for cough., Disp: 120 mL, Rfl: 0   sertraline (ZOLOFT) 25 MG tablet, TAKE 1 TABLET(25 MG) BY MOUTH DAILY (Patient taking differently: Take 25 mg by mouth daily.), Disp: 90 tablet, Rfl: 0  Current Facility-Administered Medications:    ipratropium-albuterol (DUONEB) 0.5-2.5 (3) MG/3ML nebulizer solution 3 mL, 3 mL, Nebulization, Q4H PRN, Spero Geralds, MD, 3 mL at 05/10/22 1132  Observations/Objective: Patient is well-developed, well-nourished in no acute distress.  Resting comfortably at home.  Head is normocephalic, atraumatic.  No labored breathing. Coarse coughing during interview. Speech is clear and coherent with logical content.  Patient is alert and oriented at baseline.     Assessment and Plan: 1. Subacute cough   Meds ordered this encounter  Medications   doxycycline (VIBRAMYCIN) 100 MG capsule    Sig: Take 1 capsule (100 mg total) by mouth 2 (two) times daily.    Dispense:  20 capsule    Refill:  0    Order Specific Question:   Supervising Provider    Answer:   Chase Picket [[id number]]   benzonatate (TESSALON) 100 MG capsule    Sig: Take 1 capsule (100 mg total) by mouth 2 (two) times daily as needed for cough.    Dispense:  20 capsule    Refill:  0    Order Specific Question:   Supervising Provider    Answer:   Chase Picket [id number]   Cough x 2 weeks, now worsening.  Probably worth an antibiotic trial at this point with new worsening symptoms.  Advised patient to go to [facility] for CXR if not improving with treatment after a few days.  Follow Up Instructions: I discussed the assessment and treatment plan with the patient. The patient was provided an opportunity to ask questions and all were answered. The patient agreed with the plan and demonstrated an understanding  of the instructions.  A copy of instructions were sent to the patient via MyChart unless otherwise noted below.     The patient was advised to call back or seek an in-person evaluation if the symptoms worsen or if the condition fails to improve as anticipated.  Time:  I spent 11 minutes with the patient via telehealth technology discussing the above problems/concerns.    Montine Circle, PA-C

## 2022-10-29 ENCOUNTER — Other Ambulatory Visit: Payer: Self-pay | Admitting: Internal Medicine

## 2022-11-12 ENCOUNTER — Emergency Department (HOSPITAL_BASED_OUTPATIENT_CLINIC_OR_DEPARTMENT_OTHER)
Admission: EM | Admit: 2022-11-12 | Discharge: 2022-11-12 | Disposition: A | Discharge status: Home / Self Care | Payer: No Typology Code available for payment source | Attending: Emergency Medicine | Admitting: Emergency Medicine

## 2022-11-12 ENCOUNTER — Encounter (HOSPITAL_BASED_OUTPATIENT_CLINIC_OR_DEPARTMENT_OTHER): Payer: Self-pay

## 2022-11-12 ENCOUNTER — Other Ambulatory Visit: Payer: Self-pay

## 2022-11-12 ENCOUNTER — Emergency Department (HOSPITAL_BASED_OUTPATIENT_CLINIC_OR_DEPARTMENT_OTHER): Payer: No Typology Code available for payment source

## 2022-11-12 ENCOUNTER — Emergency Department (HOSPITAL_COMMUNITY): Payer: No Typology Code available for payment source

## 2022-11-12 DIAGNOSIS — J45909 Unspecified asthma, uncomplicated: Secondary | ICD-10-CM | POA: Diagnosis not present

## 2022-11-12 DIAGNOSIS — M79641 Pain in right hand: Secondary | ICD-10-CM | POA: Diagnosis not present

## 2022-11-12 DIAGNOSIS — Z7951 Long term (current) use of inhaled steroids: Secondary | ICD-10-CM | POA: Insufficient documentation

## 2022-11-12 DIAGNOSIS — G5601 Carpal tunnel syndrome, right upper limb: Secondary | ICD-10-CM | POA: Diagnosis not present

## 2022-11-12 LAB — CBC WITH DIFFERENTIAL/PLATELET
Abs Immature Granulocytes: 0.02 10*3/uL (ref 0.00–0.07)
Basophils Absolute: 0 10*3/uL (ref 0.0–0.1)
Basophils Relative: 0 %
Eosinophils Absolute: 0.2 10*3/uL (ref 0.0–0.5)
Eosinophils Relative: 2 %
HCT: 35.4 % — ABNORMAL LOW (ref 36.0–46.0)
Hemoglobin: 10.8 g/dL — ABNORMAL LOW (ref 12.0–15.0)
Immature Granulocytes: 0 %
Lymphocytes Relative: 35 %
Lymphs Abs: 2.8 10*3/uL (ref 0.7–4.0)
MCH: 23.2 pg — ABNORMAL LOW (ref 26.0–34.0)
MCHC: 30.5 g/dL (ref 30.0–36.0)
MCV: 76 fL — ABNORMAL LOW (ref 80.0–100.0)
Monocytes Absolute: 0.6 10*3/uL (ref 0.1–1.0)
Monocytes Relative: 8 %
Neutro Abs: 4.4 10*3/uL (ref 1.7–7.7)
Neutrophils Relative %: 55 %
Platelets: 209 10*3/uL (ref 150–400)
RBC: 4.66 MIL/uL (ref 3.87–5.11)
RDW: 13.4 % (ref 11.5–15.5)
WBC: 8.1 10*3/uL (ref 4.0–10.5)
nRBC: 0 % (ref 0.0–0.2)

## 2022-11-12 LAB — BASIC METABOLIC PANEL
Anion gap: 12 (ref 5–15)
BUN: 19 mg/dL (ref 6–20)
CO2: 25 mmol/L (ref 22–32)
Calcium: 9.3 mg/dL (ref 8.9–10.3)
Chloride: 104 mmol/L (ref 98–111)
Creatinine, Ser: 0.83 mg/dL (ref 0.44–1.00)
GFR, Estimated: >60 mL/min (ref >60)
Glucose, Bld: 108 mg/dL — ABNORMAL HIGH (ref 70–99)
Potassium: 4 mmol/L (ref 3.5–5.1)
Sodium: 141 mmol/L (ref 135–145)

## 2022-11-12 LAB — C-REACTIVE PROTEIN: CRP: 0.6 mg/dL (ref <1.0)

## 2022-11-12 LAB — CK: Total CK: 159 U/L (ref 38–234)

## 2022-11-12 LAB — SEDIMENTATION RATE: Sed Rate: 23 mm/hr — ABNORMAL HIGH (ref 0–22)

## 2022-11-12 MED ORDER — ONDANSETRON HCL 4 MG/2ML IJ SOLN
4.0000 mg | Freq: Once | INTRAMUSCULAR | Status: AC
Start: 2022-11-12 — End: 2022-11-12
  Administered 2022-11-12: 4 mg via INTRAVENOUS
  Filled 2022-11-12: qty 2

## 2022-11-12 MED ORDER — HYDROMORPHONE HCL 1 MG/ML IJ SOLN
1.0000 mg | Freq: Once | INTRAMUSCULAR | Status: AC
  Administered 2022-11-12: 1 mg via INTRAVENOUS
  Filled 2022-11-12: qty 1

## 2022-11-12 MED ORDER — OXYCODONE-ACETAMINOPHEN 5-325 MG PO TABS: 1.0000 | ORAL_TABLET | Freq: Three times a day (TID) | ORAL | 0 refills | Status: DC | PRN

## 2022-11-12 MED ORDER — OXYCODONE-ACETAMINOPHEN 5-325 MG PO TABS
1.0000 | ORAL_TABLET | Freq: Once | ORAL | Status: AC
  Administered 2022-11-12: 1 via ORAL
  Filled 2022-11-12: qty 1

## 2022-11-12 MED ORDER — IOHEXOL 350 MG/ML SOLN
100.0000 mL | Freq: Once | INTRAVENOUS | Status: AC | PRN
Start: 2022-11-12 — End: 2022-11-12
  Administered 2022-11-12: 100 mL via INTRAVENOUS

## 2022-11-12 MED ORDER — MIDAZOLAM HCL 2 MG/2ML IJ SOLN
2.0000 mg | Freq: Once | INTRAMUSCULAR | Status: AC | PRN
  Administered 2022-11-12: 2 mg via INTRAVENOUS
  Filled 2022-11-12: qty 2

## 2022-11-12 MED ORDER — PREDNISONE 10 MG (21) PO TBPK: ORAL_TABLET | Freq: Every day | ORAL | 0 refills | Status: DC

## 2022-11-12 MED ORDER — ONDANSETRON HCL 4 MG/2ML IJ SOLN
4.0000 mg | Freq: Once | INTRAMUSCULAR | Status: AC
  Administered 2022-11-12: 4 mg via INTRAVENOUS
  Filled 2022-11-12: qty 2

## 2022-11-12 MED ORDER — HYDROMORPHONE HCL 1 MG/ML IJ SOLN
0.5000 mg | INTRAMUSCULAR | Status: DC | PRN
  Administered 2022-11-12 (×3): 0.5 mg via INTRAVENOUS
  Filled 2022-11-12 (×3): qty 1

## 2022-11-12 MED ORDER — NAPROXEN 500 MG PO TABS: 500.0000 mg | ORAL_TABLET | Freq: Two times a day (BID) | ORAL | 0 refills | Status: DC

## 2022-11-12 MED ORDER — NAPROXEN 250 MG PO TABS
500.0000 mg | ORAL_TABLET | Freq: Once | ORAL | Status: AC
  Administered 2022-11-12: 500 mg via ORAL
  Filled 2022-11-12: qty 2

## 2022-11-12 MED ORDER — KETOROLAC TROMETHAMINE 30 MG/ML IJ SOLN
30.0000 mg | Freq: Once | INTRAMUSCULAR | Status: AC
  Administered 2022-11-12: 30 mg via INTRAVENOUS
  Filled 2022-11-12: qty 1

## 2022-11-12 MED ORDER — DEXAMETHASONE SODIUM PHOSPHATE 10 MG/ML IJ SOLN
10.0000 mg | Freq: Once | INTRAMUSCULAR | Status: AC
  Administered 2022-11-12: 10 mg via INTRAVENOUS
  Filled 2022-11-12: qty 1

## 2022-11-12 NOTE — ED Provider Notes (Signed)
  Physical Exam  BP 110/71   Pulse (!) 52   Temp 98.5 F (36.9 C) (Temporal)   Resp 17   Ht '5\' 10"'$  (1.778 m)   Wt 127 kg   LMP 11/05/2022 (Approximate)   SpO2 100%   BMI 40.18 kg/m   Physical Exam  Procedures  Procedures  ED Course / MDM   Clinical Course as of 11/12/22 2307  Mon Nov 12, 2022  0958 No vascular abnormalities [MT]    Clinical Course User Index [MT] Wyvonnia Dusky, MD   Medical Decision Making Amount and/or Complexity of Data Reviewed Labs: ordered. Radiology: ordered. ECG/medicine tests: ordered.  Risk Prescription drug management.   Patient had come in with severe hand pain.  MRI is positive for concerns for tenosynovitis.  Spoke with Dr. Greta Doom.  He assessed the patient.  He suspects carpal tunnel syndrome.  He requested will get sed rate, CRP.  If the markers are low, then we can discharge patient with steroids and pain medications along with wrist brace.  Sed rate and CRP are not overly concerning.  Stable for discharge.       Varney Biles, MD 11/12/22 2308

## 2022-11-12 NOTE — ED Notes (Signed)
RT notified pt was placed on [place] 2 Lpm post medication administration. Pt BLBS clr/dim, respiratory status stable at this time with no distress noted.

## 2022-11-12 NOTE — Consult Note (Signed)
Reason for Consult:Right hand pain Referring Physician: Myrtie Byrd Time called: 7124 Time at bedside: [place] is an 47 y.o. female.  HPI: [name] began to have right hand pain yesterday morning. It steadily increased to become severe. She went to the ED and vascular workup was negative and hand surgery was consulted. She is RHD and works in Barrister's clerk at [employer]. She denies prior hx/o similar or antecedent event.  Past Medical History:  Diagnosis Date   Abdominal pain    Anemia    Asthma    Depression    Diarrhea    Heart murmur    Obesity    Plantar fasciitis    Shortness of breath    Trichomonal vaginitis 07/23/2022   Vomiting     Past Surgical History:  Procedure Laterality Date   CHOLECYSTECTOMY  06/19/11   IR RADIOLOGY PERIPHERAL GUIDED IV START  10/23/2018   IR US GUIDE VASC ACCESS RIGHT  10/23/2018   TUBAL LIGATION      Family History  Problem Relation Age of Onset   Diabetes Mother 54   Hypertension Mother    Other Mother        sepsis   Emphysema Father 67       smoked   Heart disease Father 39       MI   Emphysema Maternal Aunt        smoked   Esophageal cancer Maternal Aunt    Lung cancer Maternal Aunt        smoked   Heart disease Maternal Grandfather    Heart disease Maternal Aunt    Heart disease Sister 6       MI   Cancer Brother    Cancer Maternal Uncle    Cancer Maternal Grandmother    Heart disease Sister    Aneurysm Brother    Colon cancer Neg Hx    Colon polyps Neg Hx    Rectal cancer Neg Hx    Stomach cancer Neg Hx     Social History:  reports that she has never smoked. She has never used smokeless tobacco. She reports that she does not drink alcohol and does not use drugs.  Allergies: No Known Allergies  Medications: I have reviewed the patient's current medications.  Results for orders placed or performed during the hospital encounter of 11/12/22 (from the past 48 hour(s))  CBC with Differential      Status: Abnormal   Collection Time: 11/12/22  1:22 AM  Result Value Ref Range   WBC 8.1 4.0 - 10.5 K/uL   RBC 4.66 3.87 - 5.11 MIL/uL   Hemoglobin 10.8 (L) 12.0 - 15.0 g/dL   HCT 35.4 (L) 36.0 - 46.0 %   MCV 76.0 (L) 80.0 - 100.0 fL   MCH 23.2 (L) 26.0 - 34.0 pg   MCHC 30.5 30.0 - 36.0 g/dL   RDW 13.4 11.5 - 15.5 %   Platelets 209 150 - 400 K/uL   nRBC 0.0 0.0 - 0.2 %   Neutrophils Relative % 55 %   Neutro Abs 4.4 1.7 - 7.7 K/uL   Lymphocytes Relative 35 %   Lymphs Abs 2.8 0.7 - 4.0 K/uL   Monocytes Relative 8 %   Monocytes Absolute 0.6 0.1 - 1.0 K/uL   Eosinophils Relative 2 %   Eosinophils Absolute 0.2 0.0 - 0.5 K/uL   Basophils Relative 0 %   Basophils Absolute 0.0 0.0 - 0.1 K/uL   Immature Granulocytes  0 %   Abs Immature Granulocytes 0.02 0.00 - 0.07 K/uL    Comment: Performed at [facility], [address] [place], [place] [address]  Basic metabolic panel     Status: Abnormal   Collection Time: 11/12/22  1:22 AM  Result Value Ref Range   Sodium 141 135 - 145 mmol/L   Potassium 4.0 3.5 - 5.1 mmol/L   Chloride 104 98 - 111 mmol/L   CO2 25 22 - 32 mmol/L   Glucose, Bld 108 (H) 70 - 99 mg/dL    Comment: Glucose reference range applies only to samples taken after fasting for at least 8 hours.   BUN 19 6 - 20 mg/dL   Creatinine, Ser 0.83 0.44 - 1.00 mg/dL   Calcium 9.3 8.9 - 10.3 mg/dL   GFR, Estimated >60 >60 mL/min    Comment: (NOTE) Calculated using the CKD-EPI Creatinine Equation (2021)    Anion gap 12 5 - 15    Comment: Performed at [facility], [address], [place], [place] [address]  CK     Status: None   Collection Time: 11/12/22  1:22 AM  Result Value Ref Range   Total CK 159 38 - 234 U/L    Comment: Performed at [facility], [address] [place], [place] [address]    CT ANGIO UP EXTREM RIGHT W &/OR WO CONTRAST  Result Date: 11/12/2022 CLINICAL DATA:  Diminished pulses or claudication,  right upper extremity. EXAM: CT ANGIOGRAPHY UPPER RIGHT EXTREMITY TECHNIQUE: Multidetector thin section CT axial slices were obtained through the right upper extremity in the arterial phase. Multiplanar and MIP reconstructions were generated from the axial data set. CONTRAST:  149m OMNIPAQUE IOHEXOL 350 MG/ML SOLN COMPARISON:  None Available. FINDINGS: Inflow: The visualized portion of the aortic arch opacifies normally. No atherosclerotic change, dissection, stenosis or aneurysm is seen in the innominate artery and right subclavian artery. Outflow: The axillary artery is normal. Brachial artery is within normal caliber limits measuring 4 mm in the upper third of the upper arm tapering to 3-3.5 mm in the distal upper arm. No high-grade stenosis or occlusion is seen. In the forearm, there is flow within small caliber radial, ulnar and interosseous arteries gradually decreasing in density with arterial flow not seen in the distal half of the forearm, or in the wrist and hand. Whether this is due to primary arterial disease or poor bolus timing is indeterminate. Soft tissues: The lung apices are clear. There is a heterogeneous 2.2 cm mass in the right lobe of the thyroid gland, upper pole.Recommend non-emergent thyroid ultrasound. Reference: J Am Coll Radiol. 2015 Feb;12(2): 143-50. There is normal muscle bulk in right shoulder and upper extremity. No soft tissue masses or fluid collections are seen. There is no soft tissue gas. Osseous structures: No abnormality is seen. Limited intracranial: No acute or unexpected findings. Other: None. Review of the MIP images confirms the above findings. IMPRESSION: 1. No high-grade stenosis or occlusion is seen in the right upper extremity arteries at least into the mid forearm. 2. Small caliber radial, ulnar and interosseous arteries gradually decreasing in density with arterial flow not seen in the distal half of the forearm, wrist and hand. Whether this is due to primary  arterial disease or poor bolus timing is indeterminate. 3. 2.2 cm heterogeneous mass in the right lobe of the thyroid gland. Recommend non-emergent thyroid ultrasound. Electronically Signed   By: KTelford NabM.D.   On: 11/12/2022 03:15  Review of Systems  HENT:  Negative for ear discharge, ear pain, hearing loss and tinnitus.   Eyes:  Negative for photophobia and pain.  Respiratory:  Negative for cough and shortness of breath.   Cardiovascular:  Negative for chest pain.  Gastrointestinal:  Negative for abdominal pain, nausea and vomiting.  Genitourinary:  Negative for dysuria, flank pain, frequency and urgency.  Musculoskeletal:  Positive for arthralgias (Right hand). Negative for back pain, myalgias and neck pain.  Neurological:  Negative for dizziness and headaches.  Hematological:  Does not bruise/bleed easily.  Psychiatric/Behavioral:  The patient is not nervous/anxious.    Blood pressure 119/63, pulse (!) 56, temperature 97.7 F (36.5 C), temperature source Oral, resp. rate 16, height '5\' 10"'$  (1.778 m), weight 127 kg, last menstrual period 11/05/2022, SpO2 99 %. Physical Exam Constitutional:      General: She is not in acute distress.    Appearance: She is well-developed. She is not diaphoretic.  HENT:     Head: Normocephalic and atraumatic.  Eyes:     General: No scleral icterus.       Right eye: No discharge.        Left eye: No discharge.     Conjunctiva/sclera: Conjunctivae normal.  Cardiovascular:     Rate and Rhythm: Normal rate and regular rhythm.  Pulmonary:     Effort: Pulmonary effort is normal. No respiratory distress.  Musculoskeletal:     Cervical back: Normal range of motion.     Comments: Right shoulder, elbow, wrist, digits- no skin wounds, exquisite TTP distal FA to fingertips, severe pain with finger extension and wrist motion, no instability, no blocks to motion  Sens  Ax/R/M intact, U paresthetic  Mot   Ax/ R/ PIN/ M/ AIN/ U absent but likely  volitional  Rad 1+ but symmetric  Skin:    General: Skin is warm and dry.  Neurological:     Mental Status: She is alert.  Psychiatric:        Mood and Affect: Mood normal.        Behavior: Behavior normal.     Assessment/Plan: Right hand pain -- Unsure of etiology. Given the FA involvement and glove-like distribution of pain acute carpal tunnel syndrome seems unlikely. Will get MRI, Dr. Greta Doom to evaluate this evening.    Lisette Abu, PA-C Orthopedic Surgery [phone] 11/12/2022, 11:55 AM

## 2022-11-12 NOTE — ED Notes (Addendum)
Pt at ER

## 2022-11-12 NOTE — ED Notes (Signed)
Husband in room now, and pt refusing to talk when I asked her about her pain. Pt in bed with her eyes closed moaning, continues to moan even after pain meds.

## 2022-11-12 NOTE — ED Provider Notes (Signed)
Signed out to dr Langston Masker with vascular study pending   Ripley Fraise, MD 11/12/22 [phone]

## 2022-11-12 NOTE — ED Notes (Signed)
Wedding ring removed and given to Pt husband. Pt tolerated well.

## 2022-11-12 NOTE — ED Notes (Signed)
Pt transported to MRI 

## 2022-11-12 NOTE — Discharge Instructions (Addendum)
Take the medications are prescribed. Call the hand surgery team, they would like to see you in 7 days.

## 2022-11-12 NOTE — ED Provider Notes (Signed)
47 yo female here as transfer from [place] with acute onset of hand pain in past 24 hours  Left hand cooler but radial pulse present on arrival Patient in significant pain [facility] with no acute occlusion  Here pending arterial vascular ultrasound per recommendation of vascular surgery (Dr Carlis Abbott).   Physical Exam  BP 110/71   Pulse (!) 52   Temp 98.5 F (36.9 C) (Temporal)   Resp 17   Ht '5\' 10"'$  (1.778 m)   Wt 127 kg   LMP 11/05/2022 (Approximate)   SpO2 100%   BMI 40.18 kg/m   Physical Exam  Procedures  Procedures  ED Course / MDM   Clinical Course as of 11/12/22 1548  Mon Nov 12, 2022  0958 No vascular abnormalities [MT]    Clinical Course User Index [MT] Latreece Mochizuki, Carola Rhine, MD   Medical Decision Making Amount and/or Complexity of Data Reviewed Labs: ordered. Radiology: ordered. ECG/medicine tests: ordered.  Risk Prescription drug management.   0730 -patient was assessed at bedside at the start of my shift.  She was sleeping and appeared comfortable after her Dilaudid.  Her husband was also present at the bedside.  When I woke her she reports that the pain in her hand is "better" but it is still significant.  She has an easily palpable radial pulse on my exam.  She has diffuse tenderness of the entire hand, difficulty moving her fingers.  I do not see evidence of swelling or compartment syndrome.  She is pending vascular ultrasound.  Vascular ultrasound with no abnormalities noted.  Excellent blood flow.  10 am - consulted hand surgery Yvette Rack regarding possibility of peripheral nerve entrapment / carpel tunnel  12 pm - per hand surgeon consult, recommendation for MRI wrist and hand, which have been ordered, evaluating for inflammatory arthropathy or tendinopathy   3 pm - signed out to dr Kathrynn Humble pending f/u with hand surgery, who is consulting regarding tenosynovitis seen on MRI, unclear if infectious   Wyvonnia Dusky,  MD 11/12/22 [phone]

## 2022-11-12 NOTE — ED Triage Notes (Signed)
POV from home, amb to triage, A&O x 4.  Pt c/o right hand pain since this am, denies injury, sts pain is radiating to forearm and is swollen.

## 2022-11-12 NOTE — ED Notes (Signed)
Pt arrived via EMS to [facility] at this time

## 2022-11-12 NOTE — ED Provider Notes (Signed)
[facility] Provider Note: Georgena Spurling, MD, FACEP  CSN: [id number] MRN: [id number] ARRIVAL: 11/12/22 at [place]  Hand Pain   HISTORY OF PRESENT ILLNESS  11/12/22 12:40 AM [name] is a 47 y.o. female who has had pain in her right hand since yesterday morning.  The pain has become severe.  She is having difficulty moving her right hand because any movement exacerbates the pain.  The pain is located primarily in the palm of the hand but palpation of any of the fingers, even by light touch, exacerbates this pain.  There is a sensation of swelling in that hand as well.  The pain is primarily on the volar side.  She did not injure it.   Past Medical History:  Diagnosis Date   Abdominal pain    Anemia    Asthma    Depression    Diarrhea    Heart murmur    Obesity    Plantar fasciitis    Shortness of breath    Trichomonal vaginitis 07/23/2022   Vomiting     Past Surgical History:  Procedure Laterality Date   CHOLECYSTECTOMY  06/19/11   IR RADIOLOGY PERIPHERAL GUIDED IV START  10/23/2018   IR US GUIDE VASC ACCESS RIGHT  10/23/2018   TUBAL LIGATION      Family History  Problem Relation Age of Onset   Diabetes Mother 33   Hypertension Mother    Other Mother        sepsis   Emphysema Father 79       smoked   Heart disease Father 42       MI   Emphysema Maternal Aunt        smoked   Esophageal cancer Maternal Aunt    Lung cancer Maternal Aunt        smoked   Heart disease Maternal Grandfather    Heart disease Maternal Aunt    Heart disease Sister 47       MI   Cancer Brother    Cancer Maternal Uncle    Cancer Maternal Grandmother    Heart disease Sister    Aneurysm Brother    Colon cancer Neg Hx    Colon polyps Neg Hx    Rectal cancer Neg Hx    Stomach cancer Neg Hx     Social History   Tobacco Use   Smoking status: Never   Smokeless tobacco: Never  Vaping Use   Vaping Use: Never used  Substance Use  Topics   Alcohol use: No   Drug use: No    Prior to Admission medications   Medication Sig Start Date End Date Taking? Authorizing Provider  albuterol (PROVENTIL) (2.5 MG/3ML) 0.083% nebulizer solution Take 3 mLs (2.5 mg total) by nebulization every 6 (six) hours as needed for wheezing or shortness of breath. 03/27/22   Timothy Lasso, MD  albuterol (VENTOLIN HFA) 108 (90 Base) MCG/ACT inhaler Inhale 2 puffs into the lungs every 4 (four) hours as needed for wheezing or shortness of breath. 03/27/22   Timothy Lasso, MD  benzonatate (TESSALON) 100 MG capsule Take 1 capsule (100 mg total) by mouth 2 (two) times daily as needed for cough. 10/27/22   Montine Circle, PA-C  clonazePAM (KLONOPIN) 0.5 MG tablet Take 1 tablet (0.5 mg total) by mouth 2 (two) times daily as needed for anxiety. 12/27/21   Tysinger, Camelia Eng, PA-C  doxycycline (VIBRAMYCIN) 100 MG capsule Take 1 capsule (100  mg total) by mouth 2 (two) times daily. 10/27/22   Montine Circle, PA-C  fluticasone-salmeterol (ADVAIR HFA) 230-21 MCG/ACT inhaler Inhale 2 puffs into the lungs 2 (two) times daily. 05/10/22   Spero Geralds, MD  furosemide (LASIX) 40 MG tablet Take 1 tablet (40 mg total) by mouth daily. Take 1 tablet daily for 3 days Patient not taking: Reported on 07/23/2022 02/01/22   Darreld Mclean, PA-C  Insulin Pen Needle (BD PEN NEEDLE NANO U/F) 32G X 4 MM MISC 1 each by Does not apply route at bedtime. Patient not taking: Reported on 07/23/2022 10/05/20   Tysinger, Camelia Eng, PA-C  pantoprazole (PROTONIX) 40 MG tablet Take 1 tablet (40 mg total) by mouth 2 (two) times daily before a meal. 06/15/22   Spero Geralds, MD  potassium chloride SA (KLOR-CON M20) 20 MEQ tablet Take 1 tablet daily for 3 days. Patient not taking: Reported on 07/23/2022 02/01/22   Darreld Mclean, PA-C  promethazine-dextromethorphan (PROMETHAZINE-DM) 6.25-15 MG/5ML syrup Take 5 mLs by mouth 4 (four) times daily as needed for cough. 06/15/22   Spero Geralds,  MD  sertraline (ZOLOFT) 25 MG tablet TAKE 1 TABLET(25 MG) BY MOUTH DAILY Patient taking differently: Take 25 mg by mouth daily. 12/14/21   Tysinger, Camelia Eng, PA-C    Allergies Patient has no known allergies.   REVIEW OF SYSTEMS  Negative except as noted here or in the History of Present Illness.   PHYSICAL EXAMINATION  Initial Vital Signs Blood pressure 139/64, pulse 82, temperature 98.3 F (36.8 C), resp. rate 18, height '5\' 10"'$  (1.778 m), weight 127 kg, last menstrual period 11/05/2022, SpO2 100 %.  Examination General: Well-developed, well-nourished female in no acute distress; appearance consistent with age of record HENT: normocephalic; atraumatic Eyes: Normal appearance Neck: supple Heart: regular rate and rhythm Lungs: clear to auscultation bilaterally Abdomen: soft; nondistended; nontender; bowel sounds present Extremities: Mild swelling of right hand with generalized tenderness and allodynia of the right hand, hand is not pale but has poor capillary refill, fingers of right hand cool when compared to fingers of left hand, dopplerable ulnar pulse at right wrist but no dopplerable radial pulse at right wrist, decreased ability to move right hand due to pain Neurologic: Awake, alert and oriented; motor function intact in all extremities and symmetric; no facial droop Skin: Warm and dry Psychiatric: Normal mood and affect   RESULTS  Summary of this visit's results, reviewed and interpreted by myself:   EKG Interpretation  Date/Time:    Ventricular Rate:    PR Interval:    QRS Duration:   QT Interval:    QTC Calculation:   R Axis:     Text Interpretation:         Laboratory Studies: Results for orders placed or performed during the hospital encounter of 11/12/22 (from the past 24 hour(s))  CBC with Differential     Status: Abnormal   Collection Time: 11/12/22  1:22 AM  Result Value Ref Range   WBC 8.1 4.0 - 10.5 K/uL   RBC 4.66 3.87 - 5.11 MIL/uL   Hemoglobin  10.8 (L) 12.0 - 15.0 g/dL   HCT 35.4 (L) 36.0 - 46.0 %   MCV 76.0 (L) 80.0 - 100.0 fL   MCH 23.2 (L) 26.0 - 34.0 pg   MCHC 30.5 30.0 - 36.0 g/dL   RDW 13.4 11.5 - 15.5 %   Platelets 209 150 - 400 K/uL   nRBC 0.0 0.0 - 0.2 %  Neutrophils Relative % 55 %   Neutro Abs 4.4 1.7 - 7.7 K/uL   Lymphocytes Relative 35 %   Lymphs Abs 2.8 0.7 - 4.0 K/uL   Monocytes Relative 8 %   Monocytes Absolute 0.6 0.1 - 1.0 K/uL   Eosinophils Relative 2 %   Eosinophils Absolute 0.2 0.0 - 0.5 K/uL   Basophils Relative 0 %   Basophils Absolute 0.0 0.0 - 0.1 K/uL   Immature Granulocytes 0 %   Abs Immature Granulocytes 0.02 0.00 - 0.07 K/uL  Basic metabolic panel     Status: Abnormal   Collection Time: 11/12/22  1:22 AM  Result Value Ref Range   Sodium 141 135 - 145 mmol/L   Potassium 4.0 3.5 - 5.1 mmol/L   Chloride 104 98 - 111 mmol/L   CO2 25 22 - 32 mmol/L   Glucose, Bld 108 (H) 70 - 99 mg/dL   BUN 19 6 - 20 mg/dL   Creatinine, Ser 0.83 0.44 - 1.00 mg/dL   Calcium 9.3 8.9 - 10.3 mg/dL   GFR, Estimated >60 >60 mL/min   Anion gap 12 5 - 15  CK     Status: None   Collection Time: 11/12/22  1:22 AM  Result Value Ref Range   Total CK 159 38 - 234 U/L   Imaging Studies: CT ANGIO UP EXTREM RIGHT W &/OR WO CONTRAST  Result Date: 11/12/2022 CLINICAL DATA:  Diminished pulses or claudication, right upper extremity. EXAM: CT ANGIOGRAPHY UPPER RIGHT EXTREMITY TECHNIQUE: Multidetector thin section CT axial slices were obtained through the right upper extremity in the arterial phase. Multiplanar and MIP reconstructions were generated from the axial data set. CONTRAST:  133m OMNIPAQUE IOHEXOL 350 MG/ML SOLN COMPARISON:  None Available. FINDINGS: Inflow: The visualized portion of the aortic arch opacifies normally. No atherosclerotic change, dissection, stenosis or aneurysm is seen in the innominate artery and right subclavian artery. Outflow: The axillary artery is normal. Brachial artery is within normal caliber  limits measuring 4 mm in the upper third of the upper arm tapering to 3-3.5 mm in the distal upper arm. No high-grade stenosis or occlusion is seen. In the forearm, there is flow within small caliber radial, ulnar and interosseous arteries gradually decreasing in density with arterial flow not seen in the distal half of the forearm, or in the wrist and hand. Whether this is due to primary arterial disease or poor bolus timing is indeterminate. Soft tissues: The lung apices are clear. There is a heterogeneous 2.2 cm mass in the right lobe of the thyroid gland, upper pole.Recommend non-emergent thyroid ultrasound. Reference: J Am Coll Radiol. 2015 Feb;12(2): 143-50. There is normal muscle bulk in right shoulder and upper extremity. No soft tissue masses or fluid collections are seen. There is no soft tissue gas. Osseous structures: No abnormality is seen. Limited intracranial: No acute or unexpected findings. Other: None. Review of the MIP images confirms the above findings. IMPRESSION: 1. No high-grade stenosis or occlusion is seen in the right upper extremity arteries at least into the mid forearm. 2. Small caliber radial, ulnar and interosseous arteries gradually decreasing in density with arterial flow not seen in the distal half of the forearm, wrist and hand. Whether this is due to primary arterial disease or poor bolus timing is indeterminate. 3. 2.2 cm heterogeneous mass in the right lobe of the thyroid gland. Recommend non-emergent thyroid ultrasound. Electronically Signed   By: KTelford NabM.D.   On: 11/12/2022 03:15    ED  COURSE and MDM  Nursing notes, initial and subsequent vitals signs, including pulse oximetry, reviewed and interpreted by myself.  Vitals:   11/12/22 0025 11/12/22 0026 11/12/22 0439  BP:  139/64   Pulse:  82 61  Resp:  18 20  Temp:  98.3 F (36.8 C)   SpO2:  100% 100%  Weight: 127 kg    Height: '5\' 10"'$  (1.778 m)     Medications  ondansetron (ZOFRAN) injection 4 mg (4 mg  Intravenous Given 11/12/22 0107)  HYDROmorphone (DILAUDID) injection 1 mg (1 mg Intravenous Given 11/12/22 0106)  iohexol (OMNIPAQUE) 350 MG/ML injection 100 mL (100 mLs Intravenous Contrast Given 11/12/22 0215)  ondansetron (ZOFRAN) injection 4 mg (4 mg Intravenous Given 11/12/22 0301)  HYDROmorphone (DILAUDID) injection 1 mg (1 mg Intravenous Given 11/12/22 0312)   3:44 AM Although the CT scan does not appear to show flow past the mid forearm this could be due to contrast timing.  This was discussed with Dr. Carlis Abbott of vascular surgery.  He recommends we obtain an arterial Doppler ultrasound of the right forearm.  Because of the patient's severe pain I do have a concern over ischemia although it is not obviously cold or blue hand.  4:40 AM Arterial Dopplers not available at this facility.  Dr. Carlis Abbott recommends we transfer the patient to [facility] and obtaining an arterial Doppler at that facility.  Dr. Quintella Reichert is the accepting EDP.   PROCEDURES  Procedures CRITICAL CARE Performed by: Karen Chafe Loretta Doutt Total critical care time: 30 minutes Critical care time was exclusive of separately billable procedures and treating other patients. Critical care was necessary to treat or prevent imminent or life-threatening deterioration. Critical care was time spent personally by me on the following activities: development of treatment plan with patient and/or surrogate as well as nursing, discussions with consultants, evaluation of patient's response to treatment, examination of patient, obtaining history from patient or surrogate, ordering and performing treatments and interventions, ordering and review of laboratory studies, ordering and review of radiographic studies, pulse oximetry and re-evaluation of patient's condition.   ED DIAGNOSES     ICD-10-CM   1. Hand pain, not arthralgia, right  M79.641          Shanon Rosser, MD 11/12/22 [phone]

## 2022-11-12 NOTE — ED Provider Notes (Signed)
I assumed care as patient was transferred from [place] for evaluation.  Patient had sudden onset of right forearm and right hand pain.  No trauma. She was sent for concern for vascular occlusion. On my exam patient appears very uncomfortable.  Her right hand is cool to touch, but no discoloration.  She appears to have a palpable radial pulse.  She previously was found to have a dopplerable ulnar pulse.  She has limitation in range of motion of her right wrist and fingers.  Previous CT imaging did not reveal any bony injury, and no high-grade stenosis Patient is not a smoker. She will receive Doppler imaging per vascular recommendations   Ripley Fraise, MD 11/12/22 [phone]

## 2022-11-12 NOTE — Progress Notes (Signed)
Orthopedic Tech Progress Note Patient Details:  [name] 01-01-1976 [id number] Unable to apply the wrist splint due to patient's fingers not being able to straighten without causing severe pain.  Ortho Devices Type of Ortho Device: Velcro wrist splint Ortho Device/Splint Location: Right wrist Ortho Device/Splint Interventions: Ordered   Post Interventions Patient Tolerated: Unable to use device properly  [name] 11/12/2022, 12:43 PM

## 2022-11-12 NOTE — ED Notes (Signed)
Patient ambulatory to restroom  ?

## 2022-11-12 NOTE — Progress Notes (Signed)
VASCULAR LAB    Right upper extremity arterial duplex has been performed.  See CV proc for preliminary results.   Bryley Chrisman, RVT 11/12/2022, 9:52 AM

## 2022-11-13 ENCOUNTER — Encounter: Payer: Self-pay | Admitting: Orthopedic Surgery

## 2022-11-15 DIAGNOSIS — G5603 Carpal tunnel syndrome, bilateral upper limbs: Secondary | ICD-10-CM | POA: Diagnosis not present

## 2022-11-15 DIAGNOSIS — G5601 Carpal tunnel syndrome, right upper limb: Secondary | ICD-10-CM | POA: Diagnosis not present

## 2022-11-21 DIAGNOSIS — G5601 Carpal tunnel syndrome, right upper limb: Secondary | ICD-10-CM | POA: Diagnosis not present

## 2022-11-21 DIAGNOSIS — R202 Paresthesia of skin: Secondary | ICD-10-CM | POA: Diagnosis not present

## 2022-11-23 DIAGNOSIS — M25531 Pain in right wrist: Secondary | ICD-10-CM | POA: Diagnosis not present

## 2022-12-03 ENCOUNTER — Other Ambulatory Visit: Payer: Self-pay | Admitting: Internal Medicine

## 2022-12-03 ENCOUNTER — Ambulatory Visit: Payer: 59 | Admitting: Student

## 2022-12-03 VITALS — BP 126/54 | HR 85 | Ht 70.0 in | Wt 270.9 lb

## 2022-12-03 DIAGNOSIS — G5601 Carpal tunnel syndrome, right upper limb: Secondary | ICD-10-CM | POA: Diagnosis not present

## 2022-12-03 DIAGNOSIS — M65949 Unspecified synovitis and tenosynovitis, unspecified hand: Secondary | ICD-10-CM

## 2022-12-03 DIAGNOSIS — R0602 Shortness of breath: Secondary | ICD-10-CM

## 2022-12-03 DIAGNOSIS — M659 Synovitis and tenosynovitis, unspecified: Secondary | ICD-10-CM

## 2022-12-03 DIAGNOSIS — E079 Disorder of thyroid, unspecified: Secondary | ICD-10-CM | POA: Diagnosis not present

## 2022-12-03 DIAGNOSIS — Z9189 Other specified personal risk factors, not elsewhere classified: Secondary | ICD-10-CM

## 2022-12-03 HISTORY — DX: Unspecified synovitis and tenosynovitis, unspecified hand: M65.949

## 2022-12-03 HISTORY — DX: Synovitis and tenosynovitis, unspecified: M65.9

## 2022-12-03 MED ORDER — ALBUTEROL SULFATE HFA 108 (90 BASE) MCG/ACT IN AERS: 2.0000 | INHALATION_SPRAY | RESPIRATORY_TRACT | 1 refills | Status: DC | PRN

## 2022-12-03 MED ORDER — FLUTICASONE-SALMETEROL 230-21 MCG/ACT IN AERO: 2.0000 | INHALATION_SPRAY | Freq: Two times a day (BID) | RESPIRATORY_TRACT | 12 refills | Status: DC

## 2022-12-03 NOTE — Assessment & Plan Note (Signed)
Heterogeneous 2.2cm mass located in right lobe of the thyroid was identified incidentally with CT angiogram on 1-15. Further diagnostic workup with TSH and ultrasound are indicated. Patient reports heat intolerance, anxiety, palpitations, insomnia, and occasional diaphoresis. Denies weight or hair loss.  Although patient's insomnia is likely multifactorial, given her reported symptoms, hyperthyroidism is potentially a contributing factor.  - Check TSH, if low consider radioactive iodine uptake scan - Ultrasound thyroid gland

## 2022-12-03 NOTE — Patient Instructions (Addendum)
  Thank you, [name], for allowing [place] to provide your care today. Today we discussed . . .  > Insomnia       - we are ordering a sleep study because based on your symptoms you may have sleep apnea > Breath Shortness       - we are sending you another referral to ENT because your breath shortness may be caused by an irritated larynx        - we also encourage you to follow up with your pulmonologist Dr Shearon Stalls > Hand Pain       - good luck with your hand surgery on Friday   I have ordered the following labs for you:  Lab Orders         TSH        Tests ordered today:  none   Referrals ordered today:   Referral Orders         Ambulatory referral to Sleep Studies         Ambulatory referral to ENT        I have ordered the following medication/changed the following medications:   Stop the following medications: Medications Discontinued During This Encounter  Medication Reason   fluticasone-salmeterol (ADVAIR HFA) 230-21 MCG/ACT inhaler Reorder   albuterol (VENTOLIN HFA) 108 (90 Base) MCG/ACT inhaler Reorder     Start the following medications: Meds ordered this encounter  Medications   fluticasone-salmeterol (ADVAIR HFA) 230-21 MCG/ACT inhaler    Sig: Inhale 2 puffs into the lungs 2 (two) times daily.    Dispense:  30 each    Refill:  12   albuterol (VENTOLIN HFA) 108 (90 Base) MCG/ACT inhaler    Sig: Inhale 2 puffs into the lungs every 4 (four) hours as needed for wheezing or shortness of breath.    Dispense:  18 g    Refill:  1      Follow up:  5 weeks     Remember:  Please continue to take your inhaler as needed for wheezing, but limit your use to every four hours. Follow up with your pulmonologist and schedule a visit with the ENT doctors. The sleep study will help [place] determine if you have sleep apnea. We will see you again in about five weeks.   Should you have any questions or concerns please call the internal medicine clinic at  [phone].     Roswell Nickel, MD [place]

## 2022-12-03 NOTE — Assessment & Plan Note (Signed)
Patient visited the ED on July 15 for right hand pain, and was found to have flexor tenosynovitis of her middle finger with adjacent soft tissue swelling and a ganglion cyst along the volar radial wrist.  She is scheduled for hand surgery on February 9.  - Proceed with hand surgery on 2-9

## 2022-12-03 NOTE — Progress Notes (Signed)
CC: Routine Follow-up  HPI:   [name] is a 47 y.o. female with a past medical history of obesity, asthma, and depression who presents for routine follow-up. She was last seen at [facility] in 02-2022.    Past Medical History:  Diagnosis Date   Abdominal pain    Anemia    Asthma    Depression    Diarrhea    Heart murmur    Obesity    Plantar fasciitis    Shortness of breath    Trichomonal vaginitis 07/23/2022   Vomiting      Review of Systems:    Reports breath shortness, orthopnea, insomnia, palpitations, anxiety, diaphoresis, cough, wheezing Denies fever, chest pain, recent illnesses   Physical Exam:  Vitals:   12/03/22 1422  BP: (!) 126/54  Pulse: 85  SpO2: 99%  Weight: 270 lb 14.4 oz (122.9 kg)  Height: '5\' 10"'$  (1.778 m)    General:   awake and alert, sitting comfortably in chair, cooperative, not in acute distress Skin:   warm and dry, intact without any obvious lesions or scars, no rashes Lungs:   normal respiratory effort, breathing unlabored, symmetrical chest rise, no crackles or wheezing Cardiac:   regular rate and rhythm, normal S1 and S2, 2+ pitting edema BLEs Neurologic:   oriented to person-place-time, moving all extremities, no gross focal deficits Psychiatric:   frustrated mood with congruent affect, intelligible speech    Assessment & Plan:   Waking at night short of breath Patient reports difficulty falling asleep, specifically stating that she breath comfortably while lying flat on her back. She works during the night and attempts to sleep during the day. Denies nighttime fatigue and snoring, but reports sometimes awakening to the sensation of sudden breath shortness. She has a history of asthma and has been using her albuterol rescue inhaler 6-12 times per day without appreciable improvement in her symptoms. Reports occasional wheezing and intermittent dry nonproductive cough. 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 Referral to ENT was placed at that time, but patient never followed up. Echocardiogram performed 01-2022 was essentially normal with EF 60-65% and only notable for grade I diastolic dysfunction. On exam, lungs were clear. No JVD. Pitting edema 2+ was present, though patient states that this is normal for her.  Concern for heart failure exacerbation remains low considering recent echocardiogram and lack of JVD and pulmonary findings on exam. Given her history and BMI 39, OSA is likely contributory and warrants further diagnostic workup.  - Continue albuterol q4 PRN and fluticasone-salmeterol q12 - Continue pantoprazole '40mg'$  q12 - Referral to sleep medicine for apnea study - Referral to ENT for evaluation  - Encourage close follow-up with pulmonology    Thyroid mass Heterogeneous 2.2cm mass located in right lobe of the thyroid was identified incidentally with CT angiogram on 1-15. Further diagnostic workup with TSH and ultrasound are indicated. Patient reports heat intolerance, anxiety, palpitations, insomnia, and occasional diaphoresis. Denies weight or hair loss.  Although patient's insomnia is likely multifactorial, given her reported symptoms, hyperthyroidism is potentially a contributing factor.  - Check TSH, if low consider radioactive iodine uptake scan - Ultrasound thyroid gland    Flexor tenosynovitis of finger Patient visited the ED on July 15 for right hand pain, and was found to have flexor tenosynovitis of her middle finger with adjacent soft tissue swelling and a ganglion cyst along the volar radial  wrist.  She is scheduled for hand surgery on February 9.  - Proceed with hand surgery on 2-9      See Encounters Tab for problem based charting.  Patient discussed with Dr. Jimmye Norman

## 2022-12-03 NOTE — Assessment & Plan Note (Signed)
Patient reports difficulty falling asleep, specifically stating that she breath comfortably while lying flat on her back. She works during the night and attempts to sleep during the day. Denies nighttime fatigue and snoring, but reports sometimes awakening to the sensation of sudden breath shortness. She has a history of asthma and has been using her albuterol rescue inhaler 6-12 times per day without appreciable improvement in her symptoms. Reports occasional wheezing and intermittent dry nonproductive cough. 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 Referral to ENT was placed at that time, but patient never followed up. Echocardiogram performed 01-2022 was essentially normal with EF 60-65% and only notable for grade I diastolic dysfunction. On exam, lungs were clear. No JVD. Pitting edema 2+ was present, though patient states that this is normal for her.  Concern for heart failure exacerbation remains low considering recent echocardiogram and lack of JVD and pulmonary findings on exam. Given her history and BMI 39, OSA is likely contributory and warrants further diagnostic workup.  - Continue albuterol q4 PRN and fluticasone-salmeterol q12 - Continue pantoprazole '40mg'$  q12 - Referral to sleep medicine for apnea study - Referral to ENT for evaluation  - Encourage close follow-up with pulmonology

## 2022-12-04 LAB — TSH: TSH: 1.82 u[IU]/mL (ref 0.450–4.500)

## 2022-12-07 DIAGNOSIS — G5601 Carpal tunnel syndrome, right upper limb: Secondary | ICD-10-CM | POA: Diagnosis not present

## 2022-12-13 NOTE — Progress Notes (Signed)
Internal Medicine Clinic Attending  Case discussed with Dr. Jodi Mourning  At the time of the visit.  We reviewed the resident's history and exam and pertinent patient test results.  I agree with the assessment, diagnosis, and plan of care documented in the resident's note.

## 2022-12-25 DIAGNOSIS — R29898 Other symptoms and signs involving the musculoskeletal system: Secondary | ICD-10-CM | POA: Diagnosis not present

## 2023-01-02 DIAGNOSIS — R29898 Other symptoms and signs involving the musculoskeletal system: Secondary | ICD-10-CM | POA: Diagnosis not present

## 2023-01-08 ENCOUNTER — Encounter: Payer: Self-pay | Admitting: Neurology

## 2023-01-08 ENCOUNTER — Institutional Professional Consult (permissible substitution): Payer: 59 | Admitting: Neurology

## 2023-01-14 ENCOUNTER — Emergency Department (HOSPITAL_BASED_OUTPATIENT_CLINIC_OR_DEPARTMENT_OTHER): Payer: No Typology Code available for payment source

## 2023-01-14 ENCOUNTER — Other Ambulatory Visit: Payer: Self-pay

## 2023-01-14 ENCOUNTER — Inpatient Hospital Stay (HOSPITAL_BASED_OUTPATIENT_CLINIC_OR_DEPARTMENT_OTHER)
Admission: EM | Admit: 2023-01-14 | Discharge: 2023-01-17 | DRG: 871 | Disposition: A | Discharge status: Home / Self Care | Payer: No Typology Code available for payment source | Attending: Internal Medicine | Admitting: Internal Medicine

## 2023-01-14 ENCOUNTER — Encounter (HOSPITAL_BASED_OUTPATIENT_CLINIC_OR_DEPARTMENT_OTHER): Payer: Self-pay | Admitting: Emergency Medicine

## 2023-01-14 DIAGNOSIS — E876 Hypokalemia: Secondary | ICD-10-CM | POA: Diagnosis present

## 2023-01-14 DIAGNOSIS — B9789 Other viral agents as the cause of diseases classified elsewhere: Secondary | ICD-10-CM | POA: Diagnosis present

## 2023-01-14 DIAGNOSIS — E669 Obesity, unspecified: Secondary | ICD-10-CM

## 2023-01-14 DIAGNOSIS — Z800 Family history of malignant neoplasm of digestive organs: Secondary | ICD-10-CM

## 2023-01-14 DIAGNOSIS — K219 Gastro-esophageal reflux disease without esophagitis: Secondary | ICD-10-CM | POA: Diagnosis present

## 2023-01-14 DIAGNOSIS — J9601 Acute respiratory failure with hypoxia: Secondary | ICD-10-CM

## 2023-01-14 DIAGNOSIS — A084 Viral intestinal infection, unspecified: Secondary | ICD-10-CM | POA: Diagnosis present

## 2023-01-14 DIAGNOSIS — J101 Influenza due to other identified influenza virus with other respiratory manifestations: Secondary | ICD-10-CM

## 2023-01-14 DIAGNOSIS — R0603 Acute respiratory distress: Secondary | ICD-10-CM

## 2023-01-14 DIAGNOSIS — A4189 Other specified sepsis: Principal | ICD-10-CM | POA: Diagnosis present

## 2023-01-14 DIAGNOSIS — Z7951 Long term (current) use of inhaled steroids: Secondary | ICD-10-CM

## 2023-01-14 DIAGNOSIS — Z833 Family history of diabetes mellitus: Secondary | ICD-10-CM

## 2023-01-14 DIAGNOSIS — Z1152 Encounter for screening for COVID-19: Secondary | ICD-10-CM

## 2023-01-14 DIAGNOSIS — Z8249 Family history of ischemic heart disease and other diseases of the circulatory system: Secondary | ICD-10-CM

## 2023-01-14 DIAGNOSIS — Z809 Family history of malignant neoplasm, unspecified: Secondary | ICD-10-CM

## 2023-01-14 DIAGNOSIS — R000 Tachycardia, unspecified: Secondary | ICD-10-CM | POA: Diagnosis not present

## 2023-01-14 DIAGNOSIS — E872 Acidosis, unspecified: Secondary | ICD-10-CM | POA: Diagnosis present

## 2023-01-14 DIAGNOSIS — I10 Essential (primary) hypertension: Secondary | ICD-10-CM | POA: Diagnosis present

## 2023-01-14 DIAGNOSIS — D509 Iron deficiency anemia, unspecified: Secondary | ICD-10-CM | POA: Diagnosis present

## 2023-01-14 DIAGNOSIS — Z79899 Other long term (current) drug therapy: Secondary | ICD-10-CM

## 2023-01-14 DIAGNOSIS — Z801 Family history of malignant neoplasm of trachea, bronchus and lung: Secondary | ICD-10-CM

## 2023-01-14 DIAGNOSIS — R062 Wheezing: Secondary | ICD-10-CM | POA: Diagnosis not present

## 2023-01-14 DIAGNOSIS — F32A Depression, unspecified: Secondary | ICD-10-CM | POA: Diagnosis present

## 2023-01-14 DIAGNOSIS — R6520 Severe sepsis without septic shock: Secondary | ICD-10-CM | POA: Diagnosis present

## 2023-01-14 DIAGNOSIS — J387 Other diseases of larynx: Secondary | ICD-10-CM | POA: Insufficient documentation

## 2023-01-14 DIAGNOSIS — R059 Cough, unspecified: Secondary | ICD-10-CM | POA: Diagnosis not present

## 2023-01-14 DIAGNOSIS — J4541 Moderate persistent asthma with (acute) exacerbation: Secondary | ICD-10-CM | POA: Diagnosis present

## 2023-01-14 DIAGNOSIS — Z825 Family history of asthma and other chronic lower respiratory diseases: Secondary | ICD-10-CM

## 2023-01-14 DIAGNOSIS — J1000 Influenza due to other identified influenza virus with unspecified type of pneumonia: Secondary | ICD-10-CM | POA: Diagnosis present

## 2023-01-14 DIAGNOSIS — Z6838 Body mass index (BMI) 38.0-38.9, adult: Secondary | ICD-10-CM

## 2023-01-14 HISTORY — DX: Acute respiratory failure with hypoxia: J96.01

## 2023-01-14 HISTORY — DX: Essential (primary) hypertension: I10

## 2023-01-14 LAB — I-STAT VENOUS BLOOD GAS, ED
Acid-Base Excess: 1 mmol/L (ref 0.0–2.0)
Acid-base deficit: 1 mmol/L (ref 0.0–2.0)
Bicarbonate: 24.3 mmol/L (ref 20.0–28.0)
Bicarbonate: 23.1 mmol/L (ref 20.0–28.0)
Calcium, Ion: 1.08 mmol/L — ABNORMAL LOW (ref 1.15–1.40)
Calcium, Ion: 1.09 mmol/L — ABNORMAL LOW (ref 1.15–1.40)
HCT: 37 % (ref 36.0–46.0)
HCT: 35 % — ABNORMAL LOW (ref 36.0–46.0)
Hemoglobin: 12.6 g/dL (ref 12.0–15.0)
Hemoglobin: 11.9 g/dL — ABNORMAL LOW (ref 12.0–15.0)
O2 Saturation: 61 %
O2 Saturation: 65 %
Patient temperature: 100.9
Potassium: 3.2 mmol/L — ABNORMAL LOW (ref 3.5–5.1)
Potassium: 3.2 mmol/L — ABNORMAL LOW (ref 3.5–5.1)
Sodium: 138 mmol/L (ref 135–145)
Sodium: 138 mmol/L (ref 135–145)
TCO2: 25 mmol/L (ref 22–32)
TCO2: 24 mmol/L (ref 22–32)
pCO2, Ven: 35.2 mmHg — ABNORMAL LOW (ref 44–60)
pCO2, Ven: 36.4 mmHg — ABNORMAL LOW (ref 44–60)
pH, Ven: 7.447 — ABNORMAL HIGH (ref 7.25–7.43)
pH, Ven: 7.415 (ref 7.25–7.43)
pO2, Ven: 30 mmHg — CL (ref 32–45)
pO2, Ven: 36 mmHg (ref 32–45)

## 2023-01-14 LAB — CBC WITH DIFFERENTIAL/PLATELET
Abs Immature Granulocytes: 0.02 10*3/uL (ref 0.00–0.07)
Basophils Absolute: 0 10*3/uL (ref 0.0–0.1)
Basophils Relative: 0 %
Eosinophils Absolute: 0.1 10*3/uL (ref 0.0–0.5)
Eosinophils Relative: 2 %
HCT: 36.2 % (ref 36.0–46.0)
Hemoglobin: 11.1 g/dL — ABNORMAL LOW (ref 12.0–15.0)
Immature Granulocytes: 0 %
Lymphocytes Relative: 19 %
Lymphs Abs: 1.1 10*3/uL (ref 0.7–4.0)
MCH: 23.1 pg — ABNORMAL LOW (ref 26.0–34.0)
MCHC: 30.7 g/dL (ref 30.0–36.0)
MCV: 75.4 fL — ABNORMAL LOW (ref 80.0–100.0)
Monocytes Absolute: 0.5 10*3/uL (ref 0.1–1.0)
Monocytes Relative: 10 %
Neutro Abs: 4 10*3/uL (ref 1.7–7.7)
Neutrophils Relative %: 69 %
Platelets: 151 10*3/uL (ref 150–400)
RBC: 4.8 MIL/uL (ref 3.87–5.11)
RDW: 13.5 % (ref 11.5–15.5)
WBC: 5.7 10*3/uL (ref 4.0–10.5)
nRBC: 0 % (ref 0.0–0.2)

## 2023-01-14 LAB — COMPREHENSIVE METABOLIC PANEL
ALT: 23 U/L (ref 0–44)
AST: 28 U/L (ref 15–41)
Albumin: 4 g/dL (ref 3.5–5.0)
Alkaline Phosphatase: 65 U/L (ref 38–126)
Anion gap: 11 (ref 5–15)
BUN: 12 mg/dL (ref 6–20)
CO2: 24 mmol/L (ref 22–32)
Calcium: 8.4 mg/dL — ABNORMAL LOW (ref 8.9–10.3)
Chloride: 100 mmol/L (ref 98–111)
Creatinine, Ser: 0.87 mg/dL (ref 0.44–1.00)
GFR, Estimated: >60 mL/min (ref >60)
Glucose, Bld: 103 mg/dL — ABNORMAL HIGH (ref 70–99)
Potassium: 3.2 mmol/L — ABNORMAL LOW (ref 3.5–5.1)
Sodium: 135 mmol/L (ref 135–145)
Total Bilirubin: 0.7 mg/dL (ref 0.3–1.2)
Total Protein: 7.8 g/dL (ref 6.5–8.1)

## 2023-01-14 LAB — BRAIN NATRIURETIC PEPTIDE: B Natriuretic Peptide: 42.1 pg/mL (ref 0.0–100.0)

## 2023-01-14 LAB — LACTIC ACID, PLASMA
Lactic Acid, Venous: 2.4 mmol/L (ref 0.5–1.9)
Lactic Acid, Venous: 3.2 mmol/L (ref 0.5–1.9)

## 2023-01-14 LAB — RESP PANEL BY RT-PCR (RSV, FLU A&B, COVID)  RVPGX2
Influenza A by PCR: POSITIVE — AB
Influenza B by PCR: NEGATIVE
Resp Syncytial Virus by PCR: NEGATIVE
SARS Coronavirus 2 by RT PCR: NEGATIVE

## 2023-01-14 LAB — HCG, SERUM, QUALITATIVE: Preg, Serum: NEGATIVE

## 2023-01-14 MED ORDER — LACTATED RINGERS IV BOLUS (SEPSIS): 200.0000 mL | Freq: Once | INTRAVENOUS | Status: DC

## 2023-01-14 MED ORDER — LACTATED RINGERS IV SOLN: INTRAVENOUS | Status: DC

## 2023-01-14 MED ORDER — LIDOCAINE HCL (CARDIAC) PF 100 MG/5ML IV SOSY
PREFILLED_SYRINGE | INTRAVENOUS | Status: AC
  Filled 2023-01-14: qty 5

## 2023-01-14 MED ORDER — PIPERACILLIN-TAZOBACTAM 3.375 G IVPB 30 MIN
3.3750 g | Freq: Once | INTRAVENOUS | Status: AC
  Administered 2023-01-14: 3.375 g via INTRAVENOUS
  Filled 2023-01-14: qty 50

## 2023-01-14 MED ORDER — LACTATED RINGERS IV BOLUS (SEPSIS): 1000.0000 mL | Freq: Once | INTRAVENOUS | Status: DC

## 2023-01-14 MED ORDER — OSELTAMIVIR PHOSPHATE 75 MG PO CAPS
75.0000 mg | ORAL_CAPSULE | Freq: Once | ORAL | Status: AC
  Administered 2023-01-14: 75 mg via ORAL
  Filled 2023-01-14: qty 1

## 2023-01-14 MED ORDER — IOHEXOL 350 MG/ML SOLN
100.0000 mL | Freq: Once | INTRAVENOUS | Status: AC | PRN
  Administered 2023-01-14: 100 mL via INTRAVENOUS

## 2023-01-14 MED ORDER — MAGNESIUM SULFATE IN D5W 1-5 GM/100ML-% IV SOLN
INTRAVENOUS | Status: AC
  Filled 2023-01-14: qty 100

## 2023-01-14 MED ORDER — LORAZEPAM 2 MG/ML IJ SOLN
0.5000 mg | Freq: Once | INTRAMUSCULAR | Status: AC
  Administered 2023-01-14: 0.5 mg via INTRAVENOUS
  Filled 2023-01-14: qty 1

## 2023-01-14 MED ORDER — MAGNESIUM SULFATE 2 GM/50ML IV SOLN
2.0000 g | Freq: Once | INTRAVENOUS | Status: AC
  Administered 2023-01-14: 2 g via INTRAVENOUS
  Filled 2023-01-14: qty 50

## 2023-01-14 MED ORDER — IPRATROPIUM-ALBUTEROL 0.5-2.5 (3) MG/3ML IN SOLN
RESPIRATORY_TRACT | Status: AC
  Filled 2023-01-14: qty 3

## 2023-01-14 MED ORDER — ACETAMINOPHEN 650 MG RE SUPP
650.0000 mg | Freq: Once | RECTAL | Status: AC
  Administered 2023-01-14: 650 mg via RECTAL
  Filled 2023-01-14: qty 1

## 2023-01-14 MED ORDER — SODIUM CHLORIDE 0.9 % IV SOLN
2.0000 g | INTRAVENOUS | Status: DC
  Administered 2023-01-14: 2 g via INTRAVENOUS
  Filled 2023-01-14: qty 20

## 2023-01-14 MED ORDER — IPRATROPIUM-ALBUTEROL 0.5-2.5 (3) MG/3ML IN SOLN
RESPIRATORY_TRACT | Status: AC
  Filled 2023-01-14: qty 6

## 2023-01-14 MED ORDER — VANCOMYCIN HCL 1250 MG/250ML IV SOLN
1250.0000 mg | Freq: Two times a day (BID) | INTRAVENOUS | Status: DC
  Filled 2023-01-14: qty 250

## 2023-01-14 MED ORDER — SODIUM CHLORIDE 0.9 % IV SOLN: INTRAVENOUS | Status: DC | PRN

## 2023-01-14 MED ORDER — ALBUTEROL SULFATE HFA 108 (90 BASE) MCG/ACT IN AERS
4.0000 | INHALATION_SPRAY | Freq: Once | RESPIRATORY_TRACT | Status: AC
  Administered 2023-01-14: 4 via RESPIRATORY_TRACT

## 2023-01-14 MED ORDER — PROPOFOL BOLUS VIA INFUSION: 1.0000 mg/kg | Freq: Once | INTRAVENOUS | Status: DC

## 2023-01-14 MED ORDER — MORPHINE SULFATE (PF) 4 MG/ML IV SOLN
4.0000 mg | Freq: Once | INTRAVENOUS | Status: AC
  Administered 2023-01-14: 4 mg via INTRAVENOUS
  Filled 2023-01-14: qty 1

## 2023-01-14 MED ORDER — SODIUM CHLORIDE 0.9 % IV BOLUS
1000.0000 mL | Freq: Once | INTRAVENOUS | Status: AC
  Administered 2023-01-14: 1000 mL via INTRAVENOUS

## 2023-01-14 MED ORDER — VANCOMYCIN HCL IN DEXTROSE 1-5 GM/200ML-% IV SOLN
1000.0000 mg | Freq: Once | INTRAVENOUS | Status: AC
  Administered 2023-01-14: 1000 mg via INTRAVENOUS
  Filled 2023-01-14: qty 200

## 2023-01-14 MED ORDER — METHYLPREDNISOLONE SODIUM SUCC 125 MG IJ SOLR
INTRAMUSCULAR | Status: AC
  Filled 2023-01-14: qty 2

## 2023-01-14 MED ORDER — IPRATROPIUM-ALBUTEROL 0.5-2.5 (3) MG/3ML IN SOLN
3.0000 mL | Freq: Once | RESPIRATORY_TRACT | Status: AC
  Administered 2023-01-14: 3 mL via RESPIRATORY_TRACT
  Filled 2023-01-14: qty 3

## 2023-01-14 MED ORDER — ETOMIDATE 2 MG/ML IV SOLN
30.0000 mg | Freq: Once | INTRAVENOUS | Status: DC
  Filled 2023-01-14: qty 20

## 2023-01-14 MED ORDER — PIPERACILLIN-TAZOBACTAM 3.375 G IVPB: 3.3750 g | Freq: Three times a day (TID) | INTRAVENOUS | Status: DC

## 2023-01-14 MED ORDER — SODIUM CHLORIDE 0.9 % IV SOLN: 500.0000 mg | INTRAVENOUS | Status: DC

## 2023-01-14 MED ORDER — ONDANSETRON HCL 4 MG/2ML IJ SOLN
4.0000 mg | Freq: Once | INTRAMUSCULAR | Status: AC
  Administered 2023-01-14: 4 mg via INTRAVENOUS
  Filled 2023-01-14: qty 2

## 2023-01-14 MED ORDER — FENTANYL 2500MCG IN NS 250ML (10MCG/ML) PREMIX INFUSION
0.0000 ug/h | INTRAVENOUS | Status: DC
  Filled 2023-01-14: qty 250

## 2023-01-14 MED ORDER — SUCCINYLCHOLINE CHLORIDE 200 MG/10ML IV SOSY
150.0000 mg | PREFILLED_SYRINGE | Freq: Once | INTRAVENOUS | Status: DC
  Filled 2023-01-14: qty 10

## 2023-01-14 NOTE — Sepsis Progress Note (Signed)
Following for sepsis protocol 

## 2023-01-14 NOTE — Progress Notes (Signed)
Plan of Care Note for accepted transfer   Patient: [name] MRN: [id number]   [place]: 01/14/2023  Facility requesting transfer: [facility] Requesting Provider: Osvaldo Shipper, PA   Reason for transfer: Acute respiratory failure with hypoxia with influenza A and asthma exacerbation Facility course:  [name] is a 47 y.o. female, history of asthma, who presents to the ED secondary to shortness of breath, sore throat, fever, nausea and vomiting that began today.  Daughter is at bedside provides all history as patient is in distress at this time.  Daughter states that she called the patient today, and the patient told her that she is not feeling good this morning, and to had cold-like symptoms.  Then called her shortly around 5 PM, saying that she really did not feel good and she needed to come to the ER.  States that when she saw her mother she was in distress, and she vomited a couple times in the ER.  When the patient came to the ER heart rate was 142 with respiratory rate of 37 and pulse symmetry was 100% on BiPAP that was later on tapered down to 8 L of O2 by nasal cannula.  Heart rate came down to 126 with management.  Labs revealed VBG that first showed pH 7.44 with HCO3 of 24.3 and later 7.415 with bicarbonate of 23.1.  CMP revealed hypokalemia of 3.2 and repeat level was 3.2.  Lactic acid was 2.4 and later 3.2.  CBC showed anemia.  Serum pregnancy test was negative.  Influenza A PCR came back positive and influenza B, COVID-19 and RSV PCR's were negative.  Blood cultures were drawn.  EKG showed sinus tachycardia with a rate of 151 with borderline prolonged QT interval. Chest CTA was slightly limited by motion artifact but showed no evidence of PE.  The patient was given IV vancomycin and Zosyn, 650 mg p.o. Tylenol, albuterol MDI 4 puffs, DuoNeb, 2 g of IV magnesium sulfate, 0.5 mg of IV Ativan, 4 mg of IV Zofran and 1 L bolus of IV normal saline.  I recommended p.o. Tamiflu.  Plan  of care: The patient is accepted for admission to Progressive unit, at [facility]..   The patient will be under the care and responsibility of the ED provider until arrival to [facility]. Author: Christel Mormon, MD 01/14/2023  Check [email or web address] for on-call coverage.  Nursing staff, Please call [place] number on Amion as soon as patient's arrival, so appropriate admitting provider can evaluate the pt.

## 2023-01-14 NOTE — ED Triage Notes (Addendum)
Cough and wheezing since this am worse in the last hour resp in to give inhaler

## 2023-01-14 NOTE — ED Provider Notes (Incomplete)
[facility] Provider Note   CSN: [id number] Arrival date & time: 01/14/23  1801     History {Add pertinent medical, surgical, social history, OB history to HPI:1} Chief Complaint  Patient presents with  . Cough    [name] is a 47 y.o. female, history of asthma, who presents to the ED secondary to shortness of breath, sore throat, fever, nausea and vomiting that began today.  Daughter is at bedside provides all history as patient is in distress at this time.  Daughter states that she called the patient today, and the patient told her that she is not feeling good this morning, and to had cold-like symptoms.  Then called her shortly around 5 PM, saying that she really did not feel good and she needed to come to the ER.  States that when she saw her mother she was in distress, and she vomited a couple times in the ER.     Home Medications Prior to Admission medications   Medication Sig Start Date End Date Taking? Authorizing Provider  albuterol (PROVENTIL) (2.5 MG/3ML) 0.083% nebulizer solution Take 3 mLs (2.5 mg total) by nebulization every 6 (six) hours as needed for wheezing or shortness of breath. 03/27/22   Timothy Lasso, MD  albuterol (VENTOLIN HFA) 108 (90 Base) MCG/ACT inhaler Inhale 2 puffs into the lungs every 4 (four) hours as needed for wheezing or shortness of breath. 12/03/22   Serita Butcher, MD  benzonatate (TESSALON) 100 MG capsule Take 1 capsule (100 mg total) by mouth 2 (two) times daily as needed for cough. 10/27/22   Montine Circle, PA-C  clonazePAM (KLONOPIN) 0.5 MG tablet Take 1 tablet (0.5 mg total) by mouth 2 (two) times daily as needed for anxiety. 12/27/21   Tysinger, Camelia Eng, PA-C  fluticasone-salmeterol (ADVAIR HFA) 230-21 MCG/ACT inhaler Inhale 2 puffs into the lungs 2 (two) times daily. 12/03/22   Serita Butcher, MD  naproxen (NAPROSYN) 500 MG tablet Take 1 tablet (500 mg total) by mouth 2 (two) times daily.  11/12/22   Varney Biles, MD  oxyCODONE-acetaminophen (PERCOCET/ROXICET) 5-325 MG tablet Take 1 tablet by mouth every 8 (eight) hours as needed for severe pain. 11/12/22   Varney Biles, MD  pantoprazole (PROTONIX) 40 MG tablet Take 1 tablet (40 mg total) by mouth 2 (two) times daily before a meal. 06/15/22   Spero Geralds, MD  predniSONE (STERAPRED UNI-PAK 21 TAB) 10 MG (21) TBPK tablet Take by mouth daily. Take 6 tabs by mouth daily  for 2 days, then 5 tabs for 2 days, then 4 tabs for 2 days, then 3 tabs for 2 days, 2 tabs for 2 days, then 1 tab by mouth daily for 2 days 11/12/22   Varney Biles, MD  promethazine-dextromethorphan (PROMETHAZINE-DM) 6.25-15 MG/5ML syrup Take 5 mLs by mouth 4 (four) times daily as needed for cough. 06/15/22   Spero Geralds, MD  sertraline (ZOLOFT) 25 MG tablet TAKE 1 TABLET(25 MG) BY MOUTH DAILY Patient taking differently: Take 25 mg by mouth daily. 12/14/21   Tysinger, Camelia Eng, PA-C      Allergies    Patient has no known allergies.    Review of Systems   Review of Systems  Constitutional:  Positive for fever.  Respiratory:  Positive for cough and shortness of breath.     Physical Exam Updated Vital Signs BP (!) 141/75   Pulse (!) 120   Temp 99.7 F (37.6 C) (Oral)   Resp Marland Kitchen)  26   Ht 5\' 10"  (1.778 m)   Wt 122.9 kg   SpO2 100%   BMI 38.88 kg/m  Physical Exam Constitutional:      General: She is in acute distress.     Appearance: She is obese. She is ill-appearing.  Eyes:     Extraocular Movements: Extraocular movements intact.     Conjunctiva/sclera: Conjunctivae normal.     Pupils: Pupils are equal, round, and reactive to light.  Cardiovascular:     Rate and Rhythm: Regular rhythm. Tachycardia present.  Pulmonary:     Effort: Respiratory distress present.     Breath sounds: Wheezing present.  Musculoskeletal:        General: Normal range of motion.  Skin:    General: Skin is warm.  Neurological:     Comments: Unable to gauge d/t  distress  Psychiatric:        Mood and Affect: Mood is anxious.     ED Results / Procedures / Treatments   Labs (all labs ordered are listed, but only abnormal results are displayed) Labs Reviewed  RESP PANEL BY RT-PCR (RSV, FLU A&B, COVID)  RVPGX2 - Abnormal; Notable for the following components:      Result Value   Influenza A by PCR POSITIVE (*)    All other components within normal limits  LACTIC ACID, PLASMA - Abnormal; Notable for the following components:   Lactic Acid, Venous 2.4 (*)    All other components within normal limits  LACTIC ACID, PLASMA - Abnormal; Notable for the following components:   Lactic Acid, Venous 3.2 (*)    All other components within normal limits  COMPREHENSIVE METABOLIC PANEL - Abnormal; Notable for the following components:   Potassium 3.2 (*)    Glucose, Bld 103 (*)    Calcium 8.4 (*)    All other components within normal limits  CBC WITH DIFFERENTIAL/PLATELET - Abnormal; Notable for the following components:   Hemoglobin 11.1 (*)    MCV 75.4 (*)    MCH 23.1 (*)    All other components within normal limits  I-STAT VENOUS BLOOD GAS, ED - Abnormal; Notable for the following components:   pH, Ven 7.447 (*)    pCO2, Ven 35.2 (*)    pO2, Ven 30 (*)    Potassium 3.2 (*)    Calcium, Ion 1.08 (*)    All other components within normal limits  I-STAT VENOUS BLOOD GAS, ED - Abnormal; Notable for the following components:   pCO2, Ven 36.4 (*)    Potassium 3.2 (*)    Calcium, Ion 1.09 (*)    HCT 35.0 (*)    Hemoglobin 11.9 (*)    All other components within normal limits  CULTURE, BLOOD (ROUTINE X 2)  CULTURE, BLOOD (ROUTINE X 2)  HCG, SERUM, QUALITATIVE  BRAIN NATRIURETIC PEPTIDE  PREGNANCY, URINE  URINALYSIS, W/ REFLEX TO CULTURE (INFECTION SUSPECTED)  I-STAT VENOUS BLOOD GAS, ED    EKG EKG Interpretation  Date/Time:  Monday January 14 2023 18:29:26 EDT Ventricular Rate:  151 PR Interval:  125 QRS Duration: 60 QT Interval:  316 QTC  Calculation: 501 R Axis:   32 Text Interpretation: Sinus tachycardia Ventricular premature complex Borderline prolonged QT interval Confirmed by Cindee Lame [id number]) on 01/14/2023 9:57:41 PM  Radiology CT Angio Chest PE W and/or Wo Contrast  Result Date: 01/14/2023 CLINICAL DATA:  Pulmonary embolism (PE) suspected, high prob. Cough, wheezing EXAM: CT ANGIOGRAPHY CHEST WITH CONTRAST TECHNIQUE: Multidetector CT imaging of the chest  was performed using the standard protocol during bolus administration of intravenous contrast. Multiplanar CT image reconstructions and MIPs were obtained to evaluate the vascular anatomy. RADIATION DOSE REDUCTION: This exam was performed according to the departmental dose-optimization program which includes automated exposure control, adjustment of the mA and/or kV according to patient size and/or use of iterative reconstruction technique. CONTRAST:  164mL OMNIPAQUE IOHEXOL 350 MG/ML SOLN COMPARISON:  01/03/2022 FINDINGS: Cardiovascular: Imaging slightly limited by motion artifact, particular within the lung bases. Satisfactory opacification of the pulmonary arteries to the segmental level. No definite evidence of pulmonary embolism. Normal heart size. No pericardial effusion. Mediastinum/Nodes: No enlarged mediastinal, hilar, or axillary lymph nodes. Thyroid gland, trachea, and esophagus demonstrate no significant findings. Lungs/Pleura: Minimal left basilar scarring again noted. Lungs are otherwise clear. No pneumothorax or pleural effusion. Upper Abdomen: No acute abnormality. Musculoskeletal: No chest wall abnormality. No acute or significant osseous findings. Review of the MIP images confirms the above findings. IMPRESSION: 1. Imaging slightly limited by motion artifact, particular within the lung bases. No definite evidence of pulmonary embolism. Electronically Signed   By: Fidela Salisbury M.D.   On: 01/14/2023 20:29    Procedures Procedures  {Document cardiac monitor,  telemetry assessment procedure when appropriate:1}  Medications Ordered in ED Medications  0.9 %  sodium chloride infusion ( Intravenous New Bag/Given 01/14/23 2021)  magnesium sulfate 1-5 GM/100ML-% IVPB (  Not Given 01/14/23 2010)  piperacillin-tazobactam (ZOSYN) IVPB 3.375 g (0 g Intravenous Stopped 01/14/23 2049)    Followed by  piperacillin-tazobactam (ZOSYN) IVPB 3.375 g (has no administration in time range)  vancomycin (VANCOREADY) IVPB 1250 mg/250 mL (has no administration in time range)  ipratropium-albuterol (DUONEB) 0.5-2.5 (3) MG/3ML nebulizer solution (  Given 01/14/23 1831)  ipratropium-albuterol (DUONEB) 0.5-2.5 (3) MG/3ML nebulizer solution (  Given 01/14/23 1831)  albuterol (VENTOLIN HFA) 108 (90 Base) MCG/ACT inhaler 4 puff (4 puffs Inhalation Given 01/14/23 1800)  methylPREDNISolone sodium succinate (SOLU-MEDROL) 125 mg/2 mL injection (  Given 01/14/23 1855)  ondansetron (ZOFRAN) injection 4 mg (4 mg Intravenous Given 01/14/23 1930)  acetaminophen (TYLENOL) suppository 650 mg (650 mg Rectal Given 01/14/23 1934)  magnesium sulfate IVPB 2 g 50 mL (0 g Intravenous Stopped 01/14/23 2032)  ipratropium-albuterol (DUONEB) 0.5-2.5 (3) MG/3ML nebulizer solution 3 mL (3 mLs Nebulization Given 01/14/23 1921)  vancomycin (VANCOCIN) IVPB 1000 mg/200 mL premix (0 mg Intravenous Stopped 01/14/23 2154)    Followed by  vancomycin (VANCOCIN) IVPB 1000 mg/200 mL premix (0 mg Intravenous Stopped 01/14/23 2312)  iohexol (OMNIPAQUE) 350 MG/ML injection 100 mL (100 mLs Intravenous Contrast Given 01/14/23 2009)  LORazepam (ATIVAN) injection 0.5 mg (0.5 mg Intravenous Given 01/14/23 2039)  sodium chloride 0.9 % bolus 1,000 mL ( Intravenous Stopped 01/14/23 2314)  morphine (PF) 4 MG/ML injection 4 mg (4 mg Intravenous Given 01/14/23 2241)  oseltamivir (TAMIFLU) capsule 75 mg (75 mg Oral Given 01/14/23 2323)    ED Course/ Medical Decision Making/ A&P Clinical Course as of 01/14/23 2356  Mon Jan 14, 2023  1905  Influenza A By PCR(!): POSITIVE [HN]  1905 Temp(!): 102.9 F (39.4 C) [HN]  1951 B Natriuretic Peptide: 42.1 [HN]  1952 Lactic Acid, Venous(!!): 2.4 [HN]  1952 Preg, Serum: NEGATIVE [HN]  2033 CT Angio Chest PE W and/or Wo Contrast 1. Imaging slightly limited by motion artifact, particular within the lung bases. No definite evidence of pulmonary embolism.   [HN]  2042 Patient is noted to be improving on BiPAP and then breathing becomes worse, more tachypneic,  when returns to [place]. CT doesn't show any abnormalities, no pneumonia/pulm edema/infiltrate/PE. Patient's BNP is wnl, just Flu A+. Pulling >1L now on BiPAP and no wheezing, no hypoxia or hypercarbia. Will give patient ativan and assess response. [HN]  2043 Temp(!): 100.9 F (38.3 C) [HN]  2043 Pulse Rate(!): 128 [HN]  2138 I-Stat venous blood gas, ([facility] ED, [facility], [facility])(!) Patient's repeat gas is stable to improved. She is RR 30s, satting 100% on RA. Vt is 700-1000cc, 10/5, 30% FIO2. HR is still in 130s, will give additional NS. Also could be due to albuterol. Received vanc/zosyn, Mg, solumedrol, duonebs. Will consult to critical care for WOB on BiPAP. [HN]  2152 Will give additional fluid resuscitation and transition to high flow nasal cannula. Consulting to admission for stepdown. [HN]  2152 Lactic Acid, Venous(!!): 3.2 [HN]    Clinical Course User Index [HN] Audley Hose, MD   {   Click here for ABCD2, HEART and other calculatorsREFRESH Note before signing :1}                          Medical Decision Making Patient is a 47 year old female, who who is tachycardic, febrile, and in respiratory distress.  Dr. Mayra Neer was called to bedside as the patient was in such distress.  VBG, viral panels, sepsis workup was initiated.  Patient was placed on BiPAP secondary to distress.  CTA chest ordered secondary to tachycardia, dyspnea, and recent surgery.  Amount and/or Complexity of Data Reviewed Labs: ordered. Decision-making details  documented in ED Course.    Details: Lactic acid elevated at 2.4, 3+.  Flu A positive. Radiology: ordered. Decision-making details documented in ED Course.    Details: CTA shows no evidence of PE, however motion artifact. ECG/medicine tests: ordered.  Risk OTC drugs. Prescription drug management. Decision regarding hospitalization.    Final Clinical Impression(s) / ED Diagnoses Final diagnoses:  Influenza A  Respiratory distress    Rx / DC Orders ED Discharge Orders     None

## 2023-01-14 NOTE — ED Notes (Signed)
Bipap D/C'd patient placed on 8L [place] HFNC. Patient tolerating well. Oxygen saturation 100%/ HR 127/RR 32. Will continue to monitor .

## 2023-01-14 NOTE — ED Provider Notes (Signed)
[place] [facility] [facility] Provider Note   CSN: [id number] Arrival date & time: 01/14/23  1801     History  Chief Complaint  Patient presents with   Cough    [name] is a 47 y.o. female with h/o asthma, obesity, GERD presents with cough, SOB.  Patient presents to ED in moderate respiratory distress. Daughter at bedside states that she began to complain of shortness of breath acutely today. +h/o asthma. Did recently have carpal tunnel surgery last week.     Cough      Home Medications Prior to Admission medications   Medication Sig Start Date End Date Taking? Authorizing Provider  albuterol (PROVENTIL) (2.5 MG/3ML) 0.083% nebulizer solution Take 3 mLs (2.5 mg total) by nebulization every 6 (six) hours as needed for wheezing or shortness of breath. 03/27/22   Timothy Lasso, MD  albuterol (VENTOLIN HFA) 108 (90 Base) MCG/ACT inhaler Inhale 2 puffs into the lungs every 4 (four) hours as needed for wheezing or shortness of breath. 12/03/22   Serita Butcher, MD  benzonatate (TESSALON) 100 MG capsule Take 1 capsule (100 mg total) by mouth 2 (two) times daily as needed for cough. 10/27/22   Montine Circle, PA-C  clonazePAM (KLONOPIN) 0.5 MG tablet Take 1 tablet (0.5 mg total) by mouth 2 (two) times daily as needed for anxiety. 12/27/21   Tysinger, Camelia Eng, PA-C  fluticasone-salmeterol (ADVAIR HFA) 230-21 MCG/ACT inhaler Inhale 2 puffs into the lungs 2 (two) times daily. 12/03/22   Serita Butcher, MD  naproxen (NAPROSYN) 500 MG tablet Take 1 tablet (500 mg total) by mouth 2 (two) times daily. 11/12/22   Varney Biles, MD  oxyCODONE-acetaminophen (PERCOCET/ROXICET) 5-325 MG tablet Take 1 tablet by mouth every 8 (eight) hours as needed for severe pain. 11/12/22   Varney Biles, MD  pantoprazole (PROTONIX) 40 MG tablet Take 1 tablet (40 mg total) by mouth 2 (two) times daily before a meal. 06/15/22   Spero Geralds, MD  predniSONE (STERAPRED UNI-PAK  21 TAB) 10 MG (21) TBPK tablet Take by mouth daily. Take 6 tabs by mouth daily  for 2 days, then 5 tabs for 2 days, then 4 tabs for 2 days, then 3 tabs for 2 days, 2 tabs for 2 days, then 1 tab by mouth daily for 2 days 11/12/22   Varney Biles, MD  promethazine-dextromethorphan (PROMETHAZINE-DM) 6.25-15 MG/5ML syrup Take 5 mLs by mouth 4 (four) times daily as needed for cough. 06/15/22   Spero Geralds, MD  sertraline (ZOLOFT) 25 MG tablet TAKE 1 TABLET(25 MG) BY MOUTH DAILY Patient taking differently: Take 25 mg by mouth daily. 12/14/21   Tysinger, Camelia Eng, PA-C      Allergies    Patient has no known allergies.    Review of Systems   Review of Systems  Respiratory:  Positive for cough.    Review of systems Negative for CP.  A 10 point review of systems was performed and is negative unless otherwise reported in HPI.  Physical Exam Updated Vital Signs BP (!) 119/58 (BP Location: Left Arm)   Pulse 78   Temp 98.6 F (37 C) (Oral)   Resp 16   Ht 5\' 10"  (1.778 m)   Wt 127.1 kg   SpO2 98%   BMI 40.21 kg/m  Physical Exam General: Very anxious appearing female, lying in bed.  HEENT: PERRLA, Sclera anicteric, MMM, trachea midline.  Cardiology: tachycardic regular rate, no murmurs/rubs/gallops. BL radial and DP pulses equal  bilaterally.  Resp: moderate respiratory distress w/ tachypnea into 40s, no wheezes, rhonchi, crackles. Good air movement. Abd: Soft, non-tender, non-distended. No rebound tenderness or guarding.  GU: Deferred. MSK: No peripheral edema or signs of trauma. Extremities without deformity or TTP. No cyanosis or clubbing. Skin: warm, dry.  Neuro: A&Ox4, CNs II-XII grossly intact. MAEs. Sensation grossly intact.   ED Results / Procedures / Treatments   Labs (all labs ordered are listed, but only abnormal results are displayed) Labs Reviewed  RESP PANEL BY RT-PCR (RSV, FLU A&B, COVID)  RVPGX2 - Abnormal; Notable for the following components:      Result Value    Influenza A by PCR POSITIVE (*)    All other components within normal limits  LACTIC ACID, PLASMA - Abnormal; Notable for the following components:   Lactic Acid, Venous 2.4 (*)    All other components within normal limits  LACTIC ACID, PLASMA - Abnormal; Notable for the following components:   Lactic Acid, Venous 3.2 (*)    All other components within normal limits  COMPREHENSIVE METABOLIC PANEL - Abnormal; Notable for the following components:   Potassium 3.2 (*)    Glucose, Bld 103 (*)    Calcium 8.4 (*)    All other components within normal limits  CBC WITH DIFFERENTIAL/PLATELET - Abnormal; Notable for the following components:   Hemoglobin 11.1 (*)    MCV 75.4 (*)    MCH 23.1 (*)    All other components within normal limits  URINALYSIS, W/ REFLEX TO CULTURE (INFECTION SUSPECTED) - Abnormal; Notable for the following components:   Specific Gravity, Urine 1.032 (*)    Ketones, ur 5 (*)    Bacteria, UA RARE (*)    All other components within normal limits    EKG EKG Interpretation  Date/Time:  Monday January 14 2023 18:29:26 EDT Ventricular Rate:  151 PR Interval:  125 QRS Duration: 60 QT Interval:  316 QTC Calculation: 501 R Axis:   32 Text Interpretation: Sinus tachycardia Ventricular premature complex Borderline prolonged QT interval Confirmed by Cindee Lame [phone]) on 01/14/2023 9:57:41 PM  Radiology See ED Course  Procedures .Critical Care  Performed by: Audley Hose, MD Authorized by: Audley Hose, MD   Critical care provider statement:    Critical care time (minutes):  80   Critical care was necessary to treat or prevent imminent or life-threatening deterioration of the following conditions:  Respiratory failure and shock   Critical care was time spent personally by me on the following activities:  Development of treatment plan with patient or surrogate, discussions with consultants, evaluation of patient's response to treatment, examination of patient,  ordering and review of laboratory studies, ordering and review of radiographic studies, ordering and performing treatments and interventions, pulse oximetry, re-evaluation of patient's condition, review of old charts, obtaining history from patient or surrogate, blood draw for specimens, vascular access procedures and ventilator management   Care discussed with: accepting provider at another facility   Ultrasound ED Peripheral IV (Provider)  Date/Time: 01/26/2023 3:27 PM  Performed by: Audley Hose, MD Authorized by: Audley Hose, MD   Procedure details:    Indications: multiple failed IV attempts and poor IV access     Skin Prep: chlorhexidine gluconate     Location: right bicep.   Angiocath:  18 G   Bedside Ultrasound Guided: Yes     Images: not archived     Patient tolerated procedure without complications: Yes     Dressing applied:  Yes       EMERGENCY DEPARTMENT [place] CARDIAC EXAM "Study: Limited Ultrasound of the Heart and Pericardium"  INDICATIONS:Abnormal vital signs and Dyspnea Multiple views of the heart and pericardium were obtained in real-time with a multi-frequency probe.  PERFORMED [id number] IMAGES ARCHIVED?: Yes LIMITATIONS:  Body habitus and Emergent procedure VIEWS USED: Parasternal long axis, Parasternal short axis, and Apical 4 chamber  INTERPRETATION: Cardiac activity present, Pericardial effusioin absent, Cardiac tamponade absent, and Increased contractility. No D-sign, no mcconnells/ sign. RV looks to be normal in size.     Medications Ordered in ED Medications  magnesium sulfate 1-5 GM/100ML-% IVPB (  Not Given 01/14/23 2010)  ipratropium-albuterol (DUONEB) 0.5-2.5 (3) MG/3ML nebulizer solution 3 mL (has no administration in time range)  ipratropium-albuterol (DUONEB) 0.5-2.5 (3) MG/3ML nebulizer solution (  Given 01/14/23 1831)  ipratropium-albuterol (DUONEB) 0.5-2.5 (3) MG/3ML nebulizer solution (  Given 01/14/23 1831)  albuterol (VENTOLIN HFA) 108 (90  Base) MCG/ACT inhaler 4 puff (4 puffs Inhalation Given 01/14/23 1800)  methylPREDNISolone sodium succinate (SOLU-MEDROL) 125 mg/2 mL injection (  Given 01/14/23 1855)  ondansetron (ZOFRAN) injection 4 mg (4 mg Intravenous Given 01/14/23 1930)  acetaminophen (TYLENOL) suppository 650 mg (650 mg Rectal Given 01/14/23 1934)  magnesium sulfate IVPB 2 g 50 mL (0 g Intravenous Stopped 01/14/23 2032)  ipratropium-albuterol (DUONEB) 0.5-2.5 (3) MG/3ML nebulizer solution 3 mL (3 mLs Nebulization Given 01/14/23 1921)  vancomycin (VANCOCIN) IVPB 1000 mg/200 mL premix (0 mg Intravenous Stopped 01/14/23 2154)    Followed by  vancomycin (VANCOCIN) IVPB 1000 mg/200 mL premix (0 mg Intravenous Stopped 01/14/23 2312)  piperacillin-tazobactam (ZOSYN) IVPB 3.375 g (0 g Intravenous Stopped 01/14/23 2049)  iohexol (OMNIPAQUE) 350 MG/ML injection 100 mL (100 mLs Intravenous Contrast Given 01/14/23 2009)  LORazepam (ATIVAN) injection 0.5 mg (0.5 mg Intravenous Given 01/14/23 2039)  sodium chloride 0.9 % bolus 1,000 mL ( Intravenous Stopped 01/14/23 2314)  morphine (PF) 4 MG/ML injection 4 mg (4 mg Intravenous Given 01/14/23 2241)    ED Course/ Medical Decision Making/ A&P                          Medical Decision Making Amount and/or Complexity of Data Reviewed Labs: ordered. Decision-making details documented in ED Course. Radiology: ordered. Decision-making details documented in ED Course. ECG/medicine tests: ordered.  Risk OTC drugs. Prescription drug management. Decision regarding hospitalization.    This patient presents to the ED for concern of respiratory distress, this involves an extensive number of treatment options, and is a complaint that carries with it a high risk of complications and morbidity.  I considered the following differential and admission for this acute, potentially life threatening condition.   MDM:    Patient is immediately placed on BiPAP for WOB, tachypnea into the 40s. She is  tachycardic into the 150s-160s. She is also found to be febrile. C/f sepsis w/ respiratory source vs pulmonary embolism given recent surgery. RNs unable to obtain IV access given patient's habitus and I obtained US IV. Echocardiogram at bedside also limited by habitus but no obvious D sign or mcconnell's sign, no pericardial effusion noted. RV looked to be normal in size, lessening concern for saddle embolus but still will obtain CTA as soon as patient stable for scanner. Pt with known history of asthma as well though minimal wheezing, pulling good volumes, will treat w/ steroids, duonebs, magnesium. Patient was consented for intubation if needed.   DDX for dyspnea includes but is not  limited to:  Cardiac- CHF, Myocardial Ischemia, Valvular heart disease, Arrhythmia  Respiratory - Pneumonia / pulmonary effusion / cavitary lung disease, Pneumothorax, asthma, PE, ARDS  Other - Sepsis, Anemia     Clinical Course as of 01/26/23 1524  Mon Jan 14, 2023  1905 Influenza A By PCR(!): POSITIVE [HN]  1905 Temp(!): 102.9 F (39.4 C) [HN]  1951 B Natriuretic Peptide: 42.1 [HN]  1952 Lactic Acid, Venous(!!): 2.4 [HN]  1952 Preg, Serum: NEGATIVE [HN]  2033 CT Angio Chest PE W and/or Wo Contrast 1. Imaging slightly limited by motion artifact, particular within the lung bases. No definite evidence of pulmonary embolism.   [HN]  2042 Patient is noted to be improving on BiPAP and then breathing becomes worse, more tachypneic, when returns to resus bay. CT doesn't show any abnormalities, no pneumonia/pulm edema/infiltrate/PE. Patient's BNP is wnl, just Flu A+. Pulling >1L now on BiPAP and no wheezing, no hypoxia or hypercarbia. Will give patient ativan and assess response. [HN]  2043 Temp(!): 100.9 F (38.3 C) [HN]  2043 Pulse Rate(!): 128 [HN]  2138 I-Stat venous blood gas, ([facility], [facility], [facility])(!) Patient's repeat gas is stable to improved. She is RR 30s, satting 100% on RA. Vt is 700-1000cc, 10/5, 30%  FIO2. HR is still in 130s, will give additional NS. Also could be due to albuterol. Received vanc/zosyn, Mg, solumedrol, duonebs. Will consult to critical care for WOB on BiPAP. [HN]  2152 Will give additional fluid resuscitation and transition to high flow nasal cannula. Consulting to admission for stepdown. [HN]  2152 Lactic Acid, Venous(!!): 3.2 [HN]    Clinical Course User Index [HN] Audley Hose, MD    Labs: I Ordered, and personally interpreted labs.  The pertinent results include:  those listed above  Imaging Studies ordered: I ordered imaging studies including CXR, CTA chest I independently visualized and interpreted imaging. I agree with the radiologist interpretation  Additional history obtained from daughter at bedside, chart review.   Cardiac Monitoring: The patient was maintained on a cardiac monitor.  I personally viewed and interpreted the cardiac monitored which showed an underlying rhythm of: sinus tachycardia  Reevaluation: After the interventions noted above, I reevaluated the patient and found that they have :stayed the same  Social Determinants of Health: Patient lives independently   Disposition:  Admit to stepdown  Co morbidities that complicate the patient evaluation  Past Medical History:  Diagnosis Date   Abdominal pain    Anemia    Asthma    Depression    Diarrhea    Heart murmur    Hypertension    Obesity    Plantar fasciitis    Shortness of breath    Trichomonal vaginitis 07/23/2022   Vomiting      Medicines No orders of the defined types were placed in this encounter.   I have reviewed the patients home medicines and have made adjustments as needed  Problem List / ED Course: Problem List Items Addressed This Visit       Respiratory   Influenza A - Primary    Continue with tamiflu.      Other Visit Diagnoses     Respiratory distress       Moderate persistent asthma with acute exacerbation       Relevant Medications    ipratropium-albuterol (DUONEB) 0.5-2.5 (3) MG/3ML nebulizer solution (Completed)   ipratropium-albuterol (DUONEB) 0.5-2.5 (3) MG/3ML nebulizer solution (Completed)   albuterol (VENTOLIN HFA) 108 (90 Base) MCG/ACT inhaler 4 puff (Completed)  methylPREDNISolone sodium succinate (SOLU-MEDROL) 125 mg/2 mL injection (Completed)   ipratropium-albuterol (DUONEB) 0.5-2.5 (3) MG/3ML nebulizer solution 3 mL (Completed)   albuterol (VENTOLIN HFA) 108 (90 Base) MCG/ACT inhaler   fluticasone-salmeterol (ADVAIR HFA) 230-21 MCG/ACT inhaler   ipratropium-albuterol (DUONEB) 0.5-2.5 (3) MG/3ML nebulizer solution 3 mL                   This note was created using dictation software, which may contain spelling or grammatical errors.    Audley Hose, MD 01/26/23 [phone]

## 2023-01-14 NOTE — ED Notes (Signed)
ED TO INPATIENT HANDOFF REPORT  ED Nurse Name and Phone #:  [facility] Paramedic [phone]  S Name/Age/Gender [name] 47 y.o. female Room/Bed: MHT14/MHT14  Code Status   Code Status: Prior  Home/SNF/Other Home Patient oriented to: self, place, time, and situation Is this baseline? Yes   Triage Complete: Triage complete  Chief Complaint Acute respiratory failure with hypoxia ([place]) [J96.01]  Triage Note Cough and wheezing since this am worse in the last hour resp in to give inhaler   Allergies No Known Allergies  Level of Care/Admitting Diagnosis ED Disposition     ED Disposition  Admit   Condition  --   [place]: [place] [[id number]]  Level of Care: Progressive [102]  Admit to Progressive based on following criteria: RESPIRATORY PROBLEMS hypoxemic/hypercapnic respiratory failure that is responsive to NIPPV (BiPAP) or High Flow Nasal Cannula (6-80 lpm). Frequent assessment/intervention, no > Q2 hrs < Q4 hrs, to maintain oxygenation and pulmonary hygiene.  May [facility] to [facility] or [facility] if equivalent level of care is available:: Yes  Interfacility transfer: Yes  Covid Evaluation: Confirmed COVID Negative  Diagnosis: Acute respiratory failure with hypoxia [facility]) [id number]  Admitting Physician: Christel Mormon [id number]  Attending Physician: Christel Mormon [id number]  Certification:: I certify this patient will need inpatient services for at least 2 midnights  Estimated Length of Stay: 2          B Medical/Surgery History Past Medical History:  Diagnosis Date   Abdominal pain    Anemia    Asthma    Depression    Diarrhea    Heart murmur    Hypertension    Obesity    Plantar fasciitis    Shortness of breath    Trichomonal vaginitis 07/23/2022   Vomiting    Past Surgical History:  Procedure Laterality Date   CHOLECYSTECTOMY  06/19/11   IR RADIOLOGY PERIPHERAL GUIDED IV START  10/23/2018    IR US GUIDE VASC ACCESS RIGHT  10/23/2018   TUBAL LIGATION       A IV Location/Drains/Wounds Patient Lines/Drains/Airways Status     Active Line/Drains/Airways     Name Placement date Placement time Site Days   Peripheral IV 01/14/23 Anterior;Distal;Right;Upper Arm 01/14/23  1854  Arm  less than 1   Peripheral IV 01/14/23 20 G Anterior;Left Wrist 01/14/23  1927  Wrist  less than 1            Intake/Output Last 24 hours  Intake/Output Summary (Last 24 hours) at 01/14/2023 2344 Last data filed at 01/14/2023 2317 Gross per 24 hour  Intake 1627.01 ml  Output --  Net 1627.01 ml    Labs/Imaging Results for orders placed or performed during the hospital encounter of 01/14/23 (from the past 48 hour(s))  Resp panel by RT-PCR (RSV, Flu A&B, Covid) Anterior Nasal Swab     Status: Abnormal   Collection Time: 01/14/23  6:16 PM   Specimen: Anterior Nasal Swab  Result Value Ref Range   SARS Coronavirus 2 by RT PCR NEGATIVE NEGATIVE    Comment: (NOTE) SARS-CoV-2 target nucleic acids are NOT DETECTED.  The SARS-CoV-2 RNA is generally detectable in upper respiratory specimens during the acute phase of infection. The lowest concentration of SARS-CoV-2 viral copies this assay can detect is 138 copies/mL. A negative result does not preclude SARS-Cov-2 infection and should not be used as the sole basis for treatment or other patient management decisions. A negative result  may occur with  improper specimen collection/handling, submission of specimen other than nasopharyngeal swab, presence of viral mutation(s) within the areas targeted by this assay, and inadequate number of viral copies(<138 copies/mL). A negative result must be combined with clinical observations, patient history, and epidemiological information. The expected result is Negative.  Fact Sheet for Patients:  EntrepreneurPulse.com.au  Fact Sheet for Healthcare Providers:   IncredibleEmployment.be  This test is no t yet approved or cleared by the [place] FDA and  has been authorized for detection and/or diagnosis of SARS-CoV-2 by FDA under an Emergency Use Authorization (EUA). This EUA will remain  in effect (meaning this test can be used) for the duration of the COVID-19 declaration under Section 564(b)(1) of the Act, 21 U.S.C.section 360bbb-3(b)(1), unless the authorization is terminated  or revoked sooner.       Influenza A by PCR POSITIVE (A) NEGATIVE   Influenza B by PCR NEGATIVE NEGATIVE    Comment: (NOTE) The Xpert Xpress SARS-CoV-2/FLU/RSV plus assay is intended as an aid in the diagnosis of influenza from Nasopharyngeal swab specimens and should not be used as a sole basis for treatment. Nasal washings and aspirates are unacceptable for Xpert Xpress SARS-CoV-2/FLU/RSV testing.  Fact Sheet for Patients: EntrepreneurPulse.com.au  Fact Sheet for Healthcare Providers: IncredibleEmployment.be  This test is not yet approved or cleared by the [place] FDA and has been authorized for detection and/or diagnosis of SARS-CoV-2 by FDA under an Emergency Use Authorization (EUA). This EUA will remain in effect (meaning this test can be used) for the duration of the COVID-19 declaration under Section 564(b)(1) of the Act, 21 U.S.C. section 360bbb-3(b)(1), unless the authorization is terminated or revoked.     Resp Syncytial Virus by PCR NEGATIVE NEGATIVE    Comment: (NOTE) Fact Sheet for Patients: EntrepreneurPulse.com.au  Fact Sheet for Healthcare Providers: IncredibleEmployment.be  This test is not yet approved or cleared by the [place] FDA and has been authorized for detection and/or diagnosis of SARS-CoV-2 by FDA under an Emergency Use Authorization (EUA). This EUA will remain in effect (meaning this test can be used) for the duration  of the COVID-19 declaration under Section 564(b)(1) of the Act, 21 U.S.C. section 360bbb-3(b)(1), unless the authorization is terminated or revoked.  Performed at [facility], [address] [place], [place] [address]   Lactic acid, plasma     Status: Abnormal   Collection Time: 01/14/23  6:50 PM  Result Value Ref Range   Lactic Acid, Venous 2.4 (HH) 0.5 - 1.9 mmol/L    Comment: CRITICAL RESULT CALLED TO, READ BACK BY AND VERIFIED WITH A. NOAH RN [phone] 01/14/23 MB Performed at [facility], [address] [place], [place] [address]   Comprehensive metabolic panel     Status: Abnormal   Collection Time: 01/14/23  6:50 PM  Result Value Ref Range   Sodium 135 135 - 145 mmol/L   Potassium 3.2 (L) 3.5 - 5.1 mmol/L   Chloride 100 98 - 111 mmol/L   CO2 24 22 - 32 mmol/L   Glucose, Bld 103 (H) 70 - 99 mg/dL    Comment: Glucose reference range applies only to samples taken after fasting for at least 8 hours.   BUN 12 6 - 20 mg/dL   Creatinine, Ser 0.87 0.44 - 1.00 mg/dL   Calcium 8.4 (L) 8.9 - 10.3 mg/dL   Total Protein 7.8 6.5 - 8.1 g/dL   Albumin 4.0 3.5 - 5.0 g/dL  AST 28 15 - 41 U/L   ALT 23 0 - 44 U/L   Alkaline Phosphatase 65 38 - 126 U/L   Total Bilirubin 0.7 0.3 - 1.2 mg/dL   GFR, Estimated >60 >60 mL/min    Comment: (NOTE) Calculated using the CKD-EPI Creatinine Equation (2021)    Anion gap 11 5 - 15    Comment: Performed at [facility], [address] [place], [place] [address]  CBC with Differential     Status: Abnormal   Collection Time: 01/14/23  6:50 PM  Result Value Ref Range   WBC 5.7 4.0 - 10.5 K/uL   RBC 4.80 3.87 - 5.11 MIL/uL   Hemoglobin 11.1 (L) 12.0 - 15.0 g/dL   HCT 36.2 36.0 - 46.0 %   MCV 75.4 (L) 80.0 - 100.0 fL   MCH 23.1 (L) 26.0 - 34.0 pg   MCHC 30.7 30.0 - 36.0 g/dL   RDW 13.5 11.5 - 15.5 %   Platelets 151 150 - 400 K/uL   nRBC 0.0 0.0 - 0.2 %   Neutrophils Relative % 69 %   Neutro Abs 4.0 1.7 - 7.7 K/uL    Lymphocytes Relative 19 %   Lymphs Abs 1.1 0.7 - 4.0 K/uL   Monocytes Relative 10 %   Monocytes Absolute 0.5 0.1 - 1.0 K/uL   Eosinophils Relative 2 %   Eosinophils Absolute 0.1 0.0 - 0.5 K/uL   Basophils Relative 0 %   Basophils Absolute 0.0 0.0 - 0.1 K/uL   Immature Granulocytes 0 %   Abs Immature Granulocytes 0.02 0.00 - 0.07 K/uL    Comment: Performed at [facility], [address] [place], [place] [address]  I-Stat venous blood gas, ED [facility], [facility])     Status: Abnormal   Collection Time: 01/14/23  6:55 PM  Result Value Ref Range   pH, Ven 7.447 (H) 7.25 - 7.43   pCO2, Ven 35.2 (L) 44 - 60 mmHg   pO2, Ven 30 (LL) 32 - 45 mmHg   Bicarbonate 24.3 20.0 - 28.0 mmol/L   TCO2 25 22 - 32 mmol/L   O2 Saturation 61 %   Acid-Base Excess 1.0 0.0 - 2.0 mmol/L   Sodium 138 135 - 145 mmol/L   Potassium 3.2 (L) 3.5 - 5.1 mmol/L   Calcium, Ion 1.08 (L) 1.15 - 1.40 mmol/L   HCT 37.0 36.0 - 46.0 %   Hemoglobin 12.6 12.0 - 15.0 g/dL   Collection site IV start    Drawn by Nurse    Sample type VENOUS    Comment NOTIFIED PHYSICIAN   hCG, serum, qualitative     Status: None   Collection Time: 01/14/23  6:59 PM  Result Value Ref Range   Preg, Serum NEGATIVE NEGATIVE    Comment:        THE SENSITIVITY OF THIS METHODOLOGY IS >10 mIU/mL. Performed at [facility], [address] [place], [place] [address]   Brain natriuretic peptide     Status: None   Collection Time: 01/14/23  7:16 PM  Result Value Ref Range   B Natriuretic Peptide 42.1 0.0 - 100.0 pg/mL    Comment: Performed at [facility], [address] [place], [place] [address]  Lactic acid, plasma     Status: Abnormal   Collection Time: 01/14/23  9:00 PM  Result Value Ref Range   Lactic Acid, Venous 3.2 (HH) 0.5 - 1.9 mmol/L  Comment: CRITICAL VALUE NOTED. VALUE IS CONSISTENT WITH PREVIOUSLY REPORTED/CALLED VALUE. JAG Performed at [facility], [address] [place], [place]  [address]   I-Stat venous blood gas, [facility], [facility], [facility])     Status: Abnormal   Collection Time: 01/14/23  9:11 PM  Result Value Ref Range   pH, Ven 7.415 7.25 - 7.43   pCO2, Ven 36.4 (L) 44 - 60 mmHg   pO2, Ven 36 32 - 45 mmHg   Bicarbonate 23.1 20.0 - 28.0 mmol/L   TCO2 24 22 - 32 mmol/L   O2 Saturation 65 %   Acid-base deficit 1.0 0.0 - 2.0 mmol/L   Sodium 138 135 - 145 mmol/L   Potassium 3.2 (L) 3.5 - 5.1 mmol/L   Calcium, Ion 1.09 (L) 1.15 - 1.40 mmol/L   HCT 35.0 (L) 36.0 - 46.0 %   Hemoglobin 11.9 (L) 12.0 - 15.0 g/dL   Patient temperature 100.9 F    Sample type VENOUS    Comment NOTIFIED PHYSICIAN    CT Angio Chest PE W and/or Wo Contrast  Result Date: 01/14/2023 CLINICAL DATA:  Pulmonary embolism (PE) suspected, high prob. Cough, wheezing EXAM: CT ANGIOGRAPHY CHEST WITH CONTRAST TECHNIQUE: Multidetector CT imaging of the chest was performed using the standard protocol during bolus administration of intravenous contrast. Multiplanar CT image reconstructions and MIPs were obtained to evaluate the vascular anatomy. RADIATION DOSE REDUCTION: This exam was performed according to the departmental dose-optimization program which includes automated exposure control, adjustment of the mA and/or kV according to patient size and/or use of iterative reconstruction technique. CONTRAST:  171mL OMNIPAQUE IOHEXOL 350 MG/ML SOLN COMPARISON:  01/03/2022 FINDINGS: Cardiovascular: Imaging slightly limited by motion artifact, particular within the lung bases. Satisfactory opacification of the pulmonary arteries to the segmental level. No definite evidence of pulmonary embolism. Normal heart size. No pericardial effusion. Mediastinum/Nodes: No enlarged mediastinal, hilar, or axillary lymph nodes. Thyroid gland, trachea, and esophagus demonstrate no significant findings. Lungs/Pleura: Minimal left basilar scarring again noted. Lungs are otherwise clear. No pneumothorax or pleural effusion. Upper Abdomen: No acute  abnormality. Musculoskeletal: No chest wall abnormality. No acute or significant osseous findings. Review of the MIP images confirms the above findings. IMPRESSION: 1. Imaging slightly limited by motion artifact, particular within the lung bases. No definite evidence of pulmonary embolism. Electronically Signed   By: Fidela Salisbury M.D.   On: 01/14/2023 20:29    Pending Labs Unresulted Labs (From admission, onward)     Start     Ordered   01/14/23 1817  Urinalysis, w/ Reflex to Culture (Infection Suspected) -Urine, Clean Catch  (Septic presentation on arrival (screening labs, nursing and treatment orders for obvious sepsis))  Once,   URGENT       Question:  Specimen Source  Answer:  Urine, Clean Catch   01/14/23 1816   01/14/23 1816  Blood Culture (routine x 2)  (Septic presentation on arrival (screening labs, nursing and treatment orders for obvious sepsis))  BLOOD CULTURE X 2,   STAT      01/14/23 1816   01/14/23 1816  Pregnancy, urine  (Septic presentation on arrival (screening labs, nursing and treatment orders for obvious sepsis))  Once,   URGENT        01/14/23 1816            Vitals/Pain Today's Vitals   01/14/23 2120 01/14/23 2130 01/14/23 2154 01/14/23 2317  BP: (!) 141/67 (!) 150/72 (!) 141/75   Pulse: (!) 131 Marland Kitchen)  128 (!) 120   Resp: (!) 32 19 (!) 26   Temp:   99.7 F (37.6 C)   TempSrc:   Oral   SpO2: 100% 100% 100%   Weight:      Height:      PainSc:    8     Isolation Precautions No active isolations  Medications Medications  0.9 %  sodium chloride infusion ( Intravenous New Bag/Given 01/14/23 2021)  magnesium sulfate 1-5 GM/100ML-% IVPB (  Not Given 01/14/23 2010)  piperacillin-tazobactam (ZOSYN) IVPB 3.375 g (0 g Intravenous Stopped 01/14/23 2049)    Followed by  piperacillin-tazobactam (ZOSYN) IVPB 3.375 g (has no administration in time range)  vancomycin (VANCOREADY) IVPB 1250 mg/250 mL (has no administration in time range)  ipratropium-albuterol (DUONEB)  0.5-2.5 (3) MG/3ML nebulizer solution (  Given 01/14/23 1831)  ipratropium-albuterol (DUONEB) 0.5-2.5 (3) MG/3ML nebulizer solution (  Given 01/14/23 1831)  albuterol (VENTOLIN HFA) 108 (90 Base) MCG/ACT inhaler 4 puff (4 puffs Inhalation Given 01/14/23 1800)  methylPREDNISolone sodium succinate (SOLU-MEDROL) 125 mg/2 mL injection (  Given 01/14/23 1855)  ondansetron (ZOFRAN) injection 4 mg (4 mg Intravenous Given 01/14/23 1930)  acetaminophen (TYLENOL) suppository 650 mg (650 mg Rectal Given 01/14/23 1934)  magnesium sulfate IVPB 2 g 50 mL (0 g Intravenous Stopped 01/14/23 2032)  ipratropium-albuterol (DUONEB) 0.5-2.5 (3) MG/3ML nebulizer solution 3 mL (3 mLs Nebulization Given 01/14/23 1921)  vancomycin (VANCOCIN) IVPB 1000 mg/200 mL premix (0 mg Intravenous Stopped 01/14/23 2154)    Followed by  vancomycin (VANCOCIN) IVPB 1000 mg/200 mL premix (0 mg Intravenous Stopped 01/14/23 2312)  iohexol (OMNIPAQUE) 350 MG/ML injection 100 mL (100 mLs Intravenous Contrast Given 01/14/23 2009)  LORazepam (ATIVAN) injection 0.5 mg (0.5 mg Intravenous Given 01/14/23 2039)  sodium chloride 0.9 % bolus 1,000 mL ( Intravenous Stopped 01/14/23 2314)  morphine (PF) 4 MG/ML injection 4 mg (4 mg Intravenous Given 01/14/23 2241)  oseltamivir (TAMIFLU) capsule 75 mg (75 mg Oral Given 01/14/23 2323)    Mobility walks     Focused Assessments Pulmonary Assessment Handoff:  Lung sounds: Bilateral Breath Sounds: Clear L Breath Sounds: Expiratory wheezes R Breath Sounds: Expiratory wheezes O2 Device: High Flow Nasal Cannula O2 Flow Rate (L/min): 8 L/min    R Recommendations: See Admitting Provider Note  Report given to:   Additional Notes:

## 2023-01-14 NOTE — Progress Notes (Signed)
Pharmacy Antibiotic Note  [name] is a 47 y.o. female for which pharmacy has been consulted for vancomycin and zosyn dosing for sepsis.  Patient with a history of asthma. Patient presenting with SOB, fever, sore throat.  SCr 0.87 WBC 5.7; LA 3.2; T 99.7; HR 120; RR 26 COVID negative / flu A positive  Plan: Zosyn 3.375g IV q8h (4 hour infusion) Vancomycin 2000 mg once then 1250 mg q12hr (eAUC 534.3) unless change in renal function Trend WBC, Fever, Renal function F/u cultures, clinical course, WBC De-escalate when able  Height: 5\' 10"  (177.8 cm) Weight: 122.9 kg (270 lb 15.1 oz) IBW/kg (Calculated) : 68.5  Temp (24hrs), Avg:102.9 F (39.4 C), Min:102.9 F (39.4 C), Max:102.9 F (39.4 C)  Recent Labs  Lab 01/14/23 1850  WBC 5.7  CREATININE 0.87  LATICACIDVEN 2.4*    Estimated Creatinine Clearance: 114 mL/min (by C-G formula based on SCr of 0.87 mg/dL).    No Known Allergies  Microbiology results: Pending  Thank you for allowing pharmacy to be a part of this patient's care.  Lorelei Pont, PharmD, BCPS 01/14/2023 7:52 PM ED Clinical Pharmacist -  [phone]

## 2023-01-14 NOTE — ED Provider Notes (Signed)
[facility] Provider Note   CSN: [id number] Arrival date & time: 01/14/23  1801     History  Chief Complaint  Patient presents with   Cough    [name] is a 47 y.o. female, history of asthma, who presents to the ED secondary to shortness of breath, sore throat, fever, nausea and vomiting that began today.  Daughter is at bedside provides all history as patient is in distress at this time.  Daughter states that she called the patient today, and the patient told her that she is not feeling good this morning, and to had cold-like symptoms.  Then called her shortly around 5 PM, saying that she really did not feel good and she needed to come to the ER.  States that when she saw her mother she was in distress, and she vomited a couple times in the ER.     Home Medications Prior to Admission medications   Medication Sig Start Date End Date Taking? Authorizing Provider  albuterol (PROVENTIL) (2.5 MG/3ML) 0.083% nebulizer solution Take 3 mLs (2.5 mg total) by nebulization every 6 (six) hours as needed for wheezing or shortness of breath. 03/27/22   Timothy Lasso, MD  albuterol (VENTOLIN HFA) 108 (90 Base) MCG/ACT inhaler Inhale 2 puffs into the lungs every 4 (four) hours as needed for wheezing or shortness of breath. 12/03/22   Serita Butcher, MD  benzonatate (TESSALON) 100 MG capsule Take 1 capsule (100 mg total) by mouth 2 (two) times daily as needed for cough. 10/27/22   Montine Circle, PA-C  clonazePAM (KLONOPIN) 0.5 MG tablet Take 1 tablet (0.5 mg total) by mouth 2 (two) times daily as needed for anxiety. 12/27/21   Tysinger, Camelia Eng, PA-C  fluticasone-salmeterol (ADVAIR HFA) 230-21 MCG/ACT inhaler Inhale 2 puffs into the lungs 2 (two) times daily. 12/03/22   Serita Butcher, MD  naproxen (NAPROSYN) 500 MG tablet Take 1 tablet (500 mg total) by mouth 2 (two) times daily. 11/12/22   Varney Biles, MD  oxyCODONE-acetaminophen  (PERCOCET/ROXICET) 5-325 MG tablet Take 1 tablet by mouth every 8 (eight) hours as needed for severe pain. 11/12/22   Varney Biles, MD  pantoprazole (PROTONIX) 40 MG tablet Take 1 tablet (40 mg total) by mouth 2 (two) times daily before a meal. 06/15/22   Spero Geralds, MD  predniSONE (STERAPRED UNI-PAK 21 TAB) 10 MG (21) TBPK tablet Take by mouth daily. Take 6 tabs by mouth daily  for 2 days, then 5 tabs for 2 days, then 4 tabs for 2 days, then 3 tabs for 2 days, 2 tabs for 2 days, then 1 tab by mouth daily for 2 days 11/12/22   Varney Biles, MD  promethazine-dextromethorphan (PROMETHAZINE-DM) 6.25-15 MG/5ML syrup Take 5 mLs by mouth 4 (four) times daily as needed for cough. 06/15/22   Spero Geralds, MD  sertraline (ZOLOFT) 25 MG tablet TAKE 1 TABLET(25 MG) BY MOUTH DAILY Patient taking differently: Take 25 mg by mouth daily. 12/14/21   Tysinger, Camelia Eng, PA-C      Allergies    Patient has no known allergies.    Review of Systems   Review of Systems  Constitutional:  Positive for fever.  Respiratory:  Positive for cough and shortness of breath.     Physical Exam Updated Vital Signs BP (!) 141/75   Pulse (!) 120   Temp 99.7 F (37.6 C) (Oral)   Resp (!) 26   Ht 5\' 10"  (1.778 m)  Wt 122.9 kg   SpO2 100%   BMI 38.88 kg/m  Physical Exam Constitutional:      General: She is in acute distress.     Appearance: She is obese. She is ill-appearing.  Eyes:     Extraocular Movements: Extraocular movements intact.     Conjunctiva/sclera: Conjunctivae normal.     Pupils: Pupils are equal, round, and reactive to light.  Cardiovascular:     Rate and Rhythm: Regular rhythm. Tachycardia present.  Pulmonary:     Effort: Respiratory distress present.     Breath sounds: Wheezing present.  Musculoskeletal:        General: Normal range of motion.  Skin:    General: Skin is warm.  Neurological:     Comments: Unable to gauge d/t distress  Psychiatric:        Mood and Affect: Mood is  anxious.     ED Results / Procedures / Treatments   Labs (all labs ordered are listed, but only abnormal results are displayed) Labs Reviewed  RESP PANEL BY RT-PCR (RSV, FLU A&B, COVID)  RVPGX2 - Abnormal; Notable for the following components:      Result Value   Influenza A by PCR POSITIVE (*)    All other components within normal limits  LACTIC ACID, PLASMA - Abnormal; Notable for the following components:   Lactic Acid, Venous 2.4 (*)    All other components within normal limits  LACTIC ACID, PLASMA - Abnormal; Notable for the following components:   Lactic Acid, Venous 3.2 (*)    All other components within normal limits  COMPREHENSIVE METABOLIC PANEL - Abnormal; Notable for the following components:   Potassium 3.2 (*)    Glucose, Bld 103 (*)    Calcium 8.4 (*)    All other components within normal limits  CBC WITH DIFFERENTIAL/PLATELET - Abnormal; Notable for the following components:   Hemoglobin 11.1 (*)    MCV 75.4 (*)    MCH 23.1 (*)    All other components within normal limits  I-STAT VENOUS BLOOD GAS, ED - Abnormal; Notable for the following components:   pH, Ven 7.447 (*)    pCO2, Ven 35.2 (*)    pO2, Ven 30 (*)    Potassium 3.2 (*)    Calcium, Ion 1.08 (*)    All other components within normal limits  I-STAT VENOUS BLOOD GAS, ED - Abnormal; Notable for the following components:   pCO2, Ven 36.4 (*)    Potassium 3.2 (*)    Calcium, Ion 1.09 (*)    HCT 35.0 (*)    Hemoglobin 11.9 (*)    All other components within normal limits  CULTURE, BLOOD (ROUTINE X 2)  CULTURE, BLOOD (ROUTINE X 2)  HCG, SERUM, QUALITATIVE  BRAIN NATRIURETIC PEPTIDE  PREGNANCY, URINE  URINALYSIS, W/ REFLEX TO CULTURE (INFECTION SUSPECTED)  I-STAT VENOUS BLOOD GAS, ED    EKG EKG Interpretation  Date/Time:  Monday January 14 2023 18:29:26 EDT Ventricular Rate:  151 PR Interval:  125 QRS Duration: 60 QT Interval:  316 QTC Calculation: 501 R Axis:   32 Text Interpretation: Sinus  tachycardia Ventricular premature complex Borderline prolonged QT interval Confirmed by Cindee Lame [phone]) on 01/14/2023 9:57:41 PM  Radiology CT Angio Chest PE W and/or Wo Contrast  Result Date: 01/14/2023 CLINICAL DATA:  Pulmonary embolism (PE) suspected, high prob. Cough, wheezing EXAM: CT ANGIOGRAPHY CHEST WITH CONTRAST TECHNIQUE: Multidetector CT imaging of the chest was performed using the standard protocol during bolus administration of  intravenous contrast. Multiplanar CT image reconstructions and MIPs were obtained to evaluate the vascular anatomy. RADIATION DOSE REDUCTION: This exam was performed according to the departmental dose-optimization program which includes automated exposure control, adjustment of the mA and/or kV according to patient size and/or use of iterative reconstruction technique. CONTRAST:  118mL OMNIPAQUE IOHEXOL 350 MG/ML SOLN COMPARISON:  01/03/2022 FINDINGS: Cardiovascular: Imaging slightly limited by motion artifact, particular within the lung bases. Satisfactory opacification of the pulmonary arteries to the segmental level. No definite evidence of pulmonary embolism. Normal heart size. No pericardial effusion. Mediastinum/Nodes: No enlarged mediastinal, hilar, or axillary lymph nodes. Thyroid gland, trachea, and esophagus demonstrate no significant findings. Lungs/Pleura: Minimal left basilar scarring again noted. Lungs are otherwise clear. No pneumothorax or pleural effusion. Upper Abdomen: No acute abnormality. Musculoskeletal: No chest wall abnormality. No acute or significant osseous findings. Review of the MIP images confirms the above findings. IMPRESSION: 1. Imaging slightly limited by motion artifact, particular within the lung bases. No definite evidence of pulmonary embolism. Electronically Signed   By: Fidela Salisbury M.D.   On: 01/14/2023 20:29    Procedures Procedures    Medications Ordered in ED Medications  0.9 %  sodium chloride infusion ( Intravenous  New Bag/Given 01/14/23 2021)  magnesium sulfate 1-5 GM/100ML-% IVPB (  Not Given 01/14/23 2010)  piperacillin-tazobactam (ZOSYN) IVPB 3.375 g (0 g Intravenous Stopped 01/14/23 2049)    Followed by  piperacillin-tazobactam (ZOSYN) IVPB 3.375 g (has no administration in time range)  vancomycin (VANCOREADY) IVPB 1250 mg/250 mL (has no administration in time range)  ipratropium-albuterol (DUONEB) 0.5-2.5 (3) MG/3ML nebulizer solution (  Given 01/14/23 1831)  ipratropium-albuterol (DUONEB) 0.5-2.5 (3) MG/3ML nebulizer solution (  Given 01/14/23 1831)  albuterol (VENTOLIN HFA) 108 (90 Base) MCG/ACT inhaler 4 puff (4 puffs Inhalation Given 01/14/23 1800)  methylPREDNISolone sodium succinate (SOLU-MEDROL) 125 mg/2 mL injection (  Given 01/14/23 1855)  ondansetron (ZOFRAN) injection 4 mg (4 mg Intravenous Given 01/14/23 1930)  acetaminophen (TYLENOL) suppository 650 mg (650 mg Rectal Given 01/14/23 1934)  magnesium sulfate IVPB 2 g 50 mL (0 g Intravenous Stopped 01/14/23 2032)  ipratropium-albuterol (DUONEB) 0.5-2.5 (3) MG/3ML nebulizer solution 3 mL (3 mLs Nebulization Given 01/14/23 1921)  vancomycin (VANCOCIN) IVPB 1000 mg/200 mL premix (0 mg Intravenous Stopped 01/14/23 2154)    Followed by  vancomycin (VANCOCIN) IVPB 1000 mg/200 mL premix (0 mg Intravenous Stopped 01/14/23 2312)  iohexol (OMNIPAQUE) 350 MG/ML injection 100 mL (100 mLs Intravenous Contrast Given 01/14/23 2009)  LORazepam (ATIVAN) injection 0.5 mg (0.5 mg Intravenous Given 01/14/23 2039)  sodium chloride 0.9 % bolus 1,000 mL ( Intravenous Stopped 01/14/23 2314)  morphine (PF) 4 MG/ML injection 4 mg (4 mg Intravenous Given 01/14/23 2241)  oseltamivir (TAMIFLU) capsule 75 mg (75 mg Oral Given 01/14/23 2323)    ED Course/ Medical Decision Making/ A&P Clinical Course as of 01/14/23 2356  Mon Jan 14, 2023  1905 Influenza A By PCR(!): POSITIVE [HN]  1905 Temp(!): 102.9 F (39.4 C) [HN]  1951 B Natriuretic Peptide: 42.1 [HN]  1952 Lactic Acid,  Venous(!!): 2.4 [HN]  1952 Preg, Serum: NEGATIVE [HN]  2033 CT Angio Chest PE W and/or Wo Contrast 1. Imaging slightly limited by motion artifact, particular within the lung bases. No definite evidence of pulmonary embolism.   [HN]  2042 Patient is noted to be improving on BiPAP and then breathing becomes worse, more tachypneic, when returns to resus bay. CT doesn't show any abnormalities, no pneumonia/pulm edema/infiltrate/PE. Patient's BNP is wnl,  just Flu A+. Pulling >1L now on BiPAP and no wheezing, no hypoxia or hypercarbia. Will give patient ativan and assess response. [HN]  2043 Temp(!): 100.9 F (38.3 C) [HN]  2043 Pulse Rate(!): 128 [HN]  2138 I-Stat venous blood gas, ([facility] ED, [facility], [facility])(!) Patient's repeat gas is stable to improved. She is RR 30s, satting 100% on RA. Vt is 700-1000cc, 10/5, 30% FIO2. HR is still in 130s, will give additional NS. Also could be due to albuterol. Received vanc/zosyn, Mg, solumedrol, duonebs. Will consult to critical care for WOB on BiPAP. [HN]  2152 Will give additional fluid resuscitation and transition to high flow nasal cannula. Consulting to admission for stepdown. [HN]  2152 Lactic Acid, Venous(!!): 3.2 [HN]    Clinical Course User Index [HN] Audley Hose, MD                             Medical Decision Making Patient is a 47 year old female, who who is tachycardic, febrile, and in respiratory distress.  Dr. Mayra Neer was called to bedside as the patient was in such distress.  VBG, viral panels, sepsis workup was initiated.  Patient was placed on BiPAP secondary to distress.  CTA chest ordered secondary to tachycardia, dyspnea, and recent surgery.  Amount and/or Complexity of Data Reviewed Labs: ordered. Decision-making details documented in ED Course.    Details: Lactic acid elevated at 2.4, 3+.  Flu A positive. Radiology: ordered. Decision-making details documented in ED Course.    Details: CTA shows no evidence of PE, however motion  artifact. ECG/medicine tests: ordered.    Details: Sinus tachycardia Discussion of management or test interpretation with external provider(s): Patient admitted to hospitalist secondary to asthma exacerbation, influenza, respiratory distress.  Patient on 8 L O2, titrated down from BiPAP.  Receiving fluids, heart rate improving, now in the 120s.  Will need admission secondary to respiratory distress, and need for fluids, as well as nebs, and oxygen supplementation.  Not on oxygen at home.  Risk OTC drugs. Prescription drug management. Decision regarding hospitalization.    Final Clinical Impression(s) / ED Diagnoses Final diagnoses:  Influenza A  Respiratory distress    Rx / DC Orders ED Discharge Orders     None         Youssouf Shipley Carlean Jews, PA 01/15/23 0011    Audley Hose, MD 01/26/23 1538

## 2023-01-15 ENCOUNTER — Encounter (HOSPITAL_COMMUNITY): Payer: Self-pay | Admitting: Family Medicine

## 2023-01-15 ENCOUNTER — Other Ambulatory Visit: Payer: Self-pay

## 2023-01-15 DIAGNOSIS — Z825 Family history of asthma and other chronic lower respiratory diseases: Secondary | ICD-10-CM | POA: Diagnosis not present

## 2023-01-15 DIAGNOSIS — E876 Hypokalemia: Secondary | ICD-10-CM

## 2023-01-15 DIAGNOSIS — Z6838 Body mass index (BMI) 38.0-38.9, adult: Secondary | ICD-10-CM | POA: Diagnosis not present

## 2023-01-15 DIAGNOSIS — Z800 Family history of malignant neoplasm of digestive organs: Secondary | ICD-10-CM | POA: Diagnosis not present

## 2023-01-15 DIAGNOSIS — A084 Viral intestinal infection, unspecified: Secondary | ICD-10-CM | POA: Diagnosis present

## 2023-01-15 DIAGNOSIS — J101 Influenza due to other identified influenza virus with other respiratory manifestations: Secondary | ICD-10-CM | POA: Diagnosis not present

## 2023-01-15 DIAGNOSIS — R6520 Severe sepsis without septic shock: Secondary | ICD-10-CM | POA: Diagnosis present

## 2023-01-15 DIAGNOSIS — R0603 Acute respiratory distress: Secondary | ICD-10-CM | POA: Diagnosis present

## 2023-01-15 DIAGNOSIS — Z809 Family history of malignant neoplasm, unspecified: Secondary | ICD-10-CM | POA: Diagnosis not present

## 2023-01-15 DIAGNOSIS — E872 Acidosis, unspecified: Secondary | ICD-10-CM | POA: Diagnosis not present

## 2023-01-15 DIAGNOSIS — Z1152 Encounter for screening for COVID-19: Secondary | ICD-10-CM | POA: Diagnosis not present

## 2023-01-15 DIAGNOSIS — J9601 Acute respiratory failure with hypoxia: Secondary | ICD-10-CM

## 2023-01-15 DIAGNOSIS — Z7951 Long term (current) use of inhaled steroids: Secondary | ICD-10-CM | POA: Diagnosis not present

## 2023-01-15 DIAGNOSIS — I10 Essential (primary) hypertension: Secondary | ICD-10-CM | POA: Diagnosis not present

## 2023-01-15 DIAGNOSIS — Z79899 Other long term (current) drug therapy: Secondary | ICD-10-CM | POA: Diagnosis not present

## 2023-01-15 DIAGNOSIS — J387 Other diseases of larynx: Secondary | ICD-10-CM | POA: Diagnosis not present

## 2023-01-15 DIAGNOSIS — J4541 Moderate persistent asthma with (acute) exacerbation: Secondary | ICD-10-CM | POA: Diagnosis not present

## 2023-01-15 DIAGNOSIS — A4189 Other specified sepsis: Secondary | ICD-10-CM | POA: Diagnosis not present

## 2023-01-15 DIAGNOSIS — B9789 Other viral agents as the cause of diseases classified elsewhere: Secondary | ICD-10-CM

## 2023-01-15 DIAGNOSIS — D509 Iron deficiency anemia, unspecified: Secondary | ICD-10-CM | POA: Diagnosis not present

## 2023-01-15 DIAGNOSIS — Z833 Family history of diabetes mellitus: Secondary | ICD-10-CM | POA: Diagnosis not present

## 2023-01-15 DIAGNOSIS — K219 Gastro-esophageal reflux disease without esophagitis: Secondary | ICD-10-CM | POA: Diagnosis present

## 2023-01-15 DIAGNOSIS — Z801 Family history of malignant neoplasm of trachea, bronchus and lung: Secondary | ICD-10-CM | POA: Diagnosis not present

## 2023-01-15 DIAGNOSIS — F32A Depression, unspecified: Secondary | ICD-10-CM | POA: Diagnosis not present

## 2023-01-15 DIAGNOSIS — Z8249 Family history of ischemic heart disease and other diseases of the circulatory system: Secondary | ICD-10-CM | POA: Diagnosis not present

## 2023-01-15 DIAGNOSIS — J1000 Influenza due to other identified influenza virus with unspecified type of pneumonia: Secondary | ICD-10-CM | POA: Diagnosis not present

## 2023-01-15 HISTORY — DX: Influenza due to other identified influenza virus with other respiratory manifestations: J10.1

## 2023-01-15 HISTORY — DX: Other diseases of larynx: J38.7

## 2023-01-15 LAB — CBC WITH DIFFERENTIAL/PLATELET
Abs Immature Granulocytes: 0.02 10*3/uL (ref 0.00–0.07)
Basophils Absolute: 0 10*3/uL (ref 0.0–0.1)
Basophils Relative: 0 %
Eosinophils Absolute: 0 10*3/uL (ref 0.0–0.5)
Eosinophils Relative: 0 %
HCT: 32 % — ABNORMAL LOW (ref 36.0–46.0)
Hemoglobin: 9.7 g/dL — ABNORMAL LOW (ref 12.0–15.0)
Immature Granulocytes: 0 %
Lymphocytes Relative: 10 %
Lymphs Abs: 0.6 10*3/uL — ABNORMAL LOW (ref 0.7–4.0)
MCH: 23 pg — ABNORMAL LOW (ref 26.0–34.0)
MCHC: 30.3 g/dL (ref 30.0–36.0)
MCV: 76 fL — ABNORMAL LOW (ref 80.0–100.0)
Monocytes Absolute: 0.1 10*3/uL (ref 0.1–1.0)
Monocytes Relative: 2 %
Neutro Abs: 5 10*3/uL (ref 1.7–7.7)
Neutrophils Relative %: 88 %
Platelets: 180 10*3/uL (ref 150–400)
RBC: 4.21 MIL/uL (ref 3.87–5.11)
RDW: 13.6 % (ref 11.5–15.5)
WBC: 5.7 10*3/uL (ref 4.0–10.5)
nRBC: 0 % (ref 0.0–0.2)

## 2023-01-15 LAB — PREGNANCY, URINE: Preg Test, Ur: NEGATIVE

## 2023-01-15 LAB — COMPREHENSIVE METABOLIC PANEL
ALT: 17 U/L (ref 0–44)
AST: 17 U/L (ref 15–41)
Albumin: 3.6 g/dL (ref 3.5–5.0)
Alkaline Phosphatase: 55 U/L (ref 38–126)
Anion gap: 12 (ref 5–15)
BUN: 9 mg/dL (ref 6–20)
CO2: 21 mmol/L — ABNORMAL LOW (ref 22–32)
Calcium: 8.2 mg/dL — ABNORMAL LOW (ref 8.9–10.3)
Chloride: 106 mmol/L (ref 98–111)
Creatinine, Ser: 0.7 mg/dL (ref 0.44–1.00)
GFR, Estimated: >60 mL/min (ref >60)
Glucose, Bld: 159 mg/dL — ABNORMAL HIGH (ref 70–99)
Potassium: 3.8 mmol/L (ref 3.5–5.1)
Sodium: 139 mmol/L (ref 135–145)
Total Bilirubin: 0.6 mg/dL (ref 0.3–1.2)
Total Protein: 6.9 g/dL (ref 6.5–8.1)

## 2023-01-15 LAB — BLOOD GAS, ARTERIAL
Acid-base deficit: 0.9 mmol/L (ref 0.0–2.0)
Bicarbonate: 22 mmol/L (ref 20.0–28.0)
Drawn by: 54496
O2 Content: 6 L/min
O2 Saturation: 98.8 %
Patient temperature: 37.3
RATE: 112 resp/min
pCO2 arterial: 31 mmHg — ABNORMAL LOW (ref 32–48)
pH, Arterial: 7.45 (ref 7.35–7.45)
pO2, Arterial: 89 mmHg (ref 83–108)

## 2023-01-15 LAB — HIV ANTIBODY (ROUTINE TESTING W REFLEX): HIV Screen 4th Generation wRfx: NONREACTIVE

## 2023-01-15 LAB — URINALYSIS, W/ REFLEX TO CULTURE (INFECTION SUSPECTED)
Bilirubin Urine: NEGATIVE
Glucose, UA: NEGATIVE mg/dL
Hgb urine dipstick: NEGATIVE
Ketones, ur: 5 mg/dL — AB
Leukocytes,Ua: NEGATIVE
Nitrite: NEGATIVE
Protein, ur: NEGATIVE mg/dL
Specific Gravity, Urine: 1.032 — ABNORMAL HIGH (ref 1.005–1.030)
pH: 6 (ref 5.0–8.0)

## 2023-01-15 LAB — LACTIC ACID, PLASMA: Lactic Acid, Venous: 1.2 mmol/L (ref 0.5–1.9)

## 2023-01-15 LAB — MAGNESIUM: Magnesium: 2.1 mg/dL (ref 1.7–2.4)

## 2023-01-15 MED ORDER — ACETAMINOPHEN 500 MG PO TABS
1000.0000 mg | ORAL_TABLET | Freq: Once | ORAL | Status: AC
  Administered 2023-01-15: 1000 mg via ORAL
  Filled 2023-01-15: qty 2

## 2023-01-15 MED ORDER — ACETAMINOPHEN 325 MG PO TABS
650.0000 mg | ORAL_TABLET | Freq: Four times a day (QID) | ORAL | Status: DC | PRN
  Administered 2023-01-15: 650 mg via ORAL
  Filled 2023-01-15: qty 2

## 2023-01-15 MED ORDER — HEPARIN SODIUM (PORCINE) 5000 UNIT/ML IJ SOLN
5000.0000 [IU] | Freq: Three times a day (TID) | INTRAMUSCULAR | Status: DC
  Administered 2023-01-15: 5000 [IU] via SUBCUTANEOUS
  Filled 2023-01-15: qty 1

## 2023-01-15 MED ORDER — ENOXAPARIN SODIUM 40 MG/0.4ML IJ SOSY
40.0000 mg | PREFILLED_SYRINGE | INTRAMUSCULAR | Status: DC
  Administered 2023-01-15: 40 mg via SUBCUTANEOUS
  Filled 2023-01-15 (×2): qty 0.4

## 2023-01-15 MED ORDER — POTASSIUM CHLORIDE 2 MEQ/ML IV SOLN: INTRAVENOUS | Status: DC

## 2023-01-15 MED ORDER — ONDANSETRON HCL 4 MG/2ML IJ SOLN: 4.0000 mg | Freq: Four times a day (QID) | INTRAMUSCULAR | Status: DC | PRN

## 2023-01-15 MED ORDER — POTASSIUM CHLORIDE 2 MEQ/ML IV SOLN
INTRAVENOUS | Status: DC
  Filled 2023-01-15 (×2): qty 1000

## 2023-01-15 MED ORDER — MOMETASONE FURO-FORMOTEROL FUM 200-5 MCG/ACT IN AERO
2.0000 | INHALATION_SPRAY | Freq: Two times a day (BID) | RESPIRATORY_TRACT | Status: DC
  Administered 2023-01-15 – 2023-01-17 (×5): 2 via RESPIRATORY_TRACT
  Filled 2023-01-15: qty 8.8

## 2023-01-15 MED ORDER — PANTOPRAZOLE SODIUM 40 MG IV SOLR
40.0000 mg | Freq: Two times a day (BID) | INTRAVENOUS | Status: DC
  Administered 2023-01-15 – 2023-01-17 (×6): 40 mg via INTRAVENOUS
  Filled 2023-01-15 (×6): qty 10

## 2023-01-15 MED ORDER — SODIUM CHLORIDE 0.9 % IV SOLN: INTRAVENOUS | Status: DC | PRN

## 2023-01-15 MED ORDER — ONDANSETRON HCL 4 MG PO TABS: 4.0000 mg | ORAL_TABLET | Freq: Four times a day (QID) | ORAL | Status: DC | PRN

## 2023-01-15 MED ORDER — LIP MEDEX EX OINT
TOPICAL_OINTMENT | CUTANEOUS | Status: DC | PRN
  Filled 2023-01-15: qty 7

## 2023-01-15 MED ORDER — BUTALBITAL-APAP-CAFFEINE 50-325-40 MG PO TABS
1.0000 | ORAL_TABLET | Freq: Once | ORAL | Status: AC
  Administered 2023-01-15: 1 via ORAL
  Filled 2023-01-15: qty 1

## 2023-01-15 MED ORDER — ALBUTEROL SULFATE (2.5 MG/3ML) 0.083% IN NEBU: 2.5000 mg | INHALATION_SOLUTION | RESPIRATORY_TRACT | Status: DC | PRN

## 2023-01-15 MED ORDER — OSELTAMIVIR PHOSPHATE 75 MG PO CAPS
75.0000 mg | ORAL_CAPSULE | Freq: Two times a day (BID) | ORAL | Status: DC
  Administered 2023-01-15 – 2023-01-17 (×5): 75 mg via ORAL
  Filled 2023-01-15 (×5): qty 1

## 2023-01-15 MED ORDER — ORAL CARE MOUTH RINSE: 15.0000 mL | OROMUCOSAL | Status: DC | PRN

## 2023-01-15 MED ORDER — ACETAMINOPHEN 650 MG RE SUPP: 650.0000 mg | Freq: Four times a day (QID) | RECTAL | Status: DC | PRN

## 2023-01-15 NOTE — Progress Notes (Signed)
   Abg 7.45/31/90 on 6 L/[place]  Kristopher Oppenheim, DO [facility]

## 2023-01-15 NOTE — H&P (Signed)
History and Physical    [name] [id number] DOB: January 29, 1976 DOA: 01/14/2023  DOS: the patient was seen and examined on 01/14/2023  PCP: Serita Butcher, MD   Patient coming from: Home  I have personally briefly reviewed patient's old medical records in [place]  CC: myalgia, SOB HPI: 47 year old African-American female history of morbid obesity BMI 38.8, reflux, dysmenorrhea who presents to the ER today with a less than 24-hour history of myalgias, fever and cough.  She states that her son is sick with viral illness.  Patient works at [employer] in [employer].  She did receive her influenza vaccine this season.  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  She follows with [facility] for what was initially thought to be asthma however her symptoms do not improve with prednisone and albuterol and it was felt the patient had irritable larynx syndrome worsened by reflux.  On arrival to the ER, patient was febrile to 102.9 heart rate 162 blood pressure 160/131.  She had increased work of breathing and was placed on BiPAP.   Labs showed a white count of 5.7, hemoglobin 11.1, platelets of 151  Influenza A was positive, COVID-negative, RSV negative, influenza B-  Sodium 135, potassium 3.2, BUN of 12, creatinine 0.8, glucose of 103, bicarbonate of 24  Initial lactic acid was elevated 2.4.  BNP normal at 42.  Given her family history of DVT, or increased work of breathing, CTPA was obtained.  This was negative for pulmonary embolism.  There is no pneumonia.  There is some scarring in the left base compared to her prior CT scan in March 2023.  Due to continued oxygen requirement, [facility] contacted for admission.   ED Course: given IV solumedrol, zosyn, vanco. Influenza A positive. Started on Bipap due to increased work of breathing.  Review of Systems:  Review of Systems  Constitutional:  Positive for  chills, fever and malaise/fatigue.  HENT: Negative.    Eyes: Negative.   Respiratory:  Positive for cough and shortness of breath.   Cardiovascular: Negative.   Gastrointestinal:  Positive for abdominal pain and diarrhea.  Genitourinary: Negative.   Musculoskeletal:  Positive for myalgias.  Skin: Negative.   Neurological: Negative.   Endo/Heme/Allergies: Negative.   Psychiatric/Behavioral: Negative.    All other systems reviewed and are negative.   Past Medical History:  Diagnosis Date   Abdominal pain    Anemia    Asthma    Depression    Diarrhea    Flexor tenosynovitis of finger 12/03/2022   Heart murmur    Hypertension    Obesity    Plantar fasciitis    Shortness of breath    Trichomonal vaginitis 07/23/2022   Vomiting     Past Surgical History:  Procedure Laterality Date   CHOLECYSTECTOMY  06/19/11   IR RADIOLOGY PERIPHERAL GUIDED IV START  10/23/2018   IR US GUIDE VASC ACCESS RIGHT  10/23/2018   TUBAL LIGATION       reports that she has never smoked. She has never used smokeless tobacco. She reports that she does not drink alcohol and does not use drugs.  No Known Allergies  Family History  Problem Relation Age of Onset   Diabetes Mother 45   Hypertension Mother    Other Mother        sepsis   Emphysema Father 6       smoked   Heart disease Father 45  MI   Emphysema Maternal Aunt        smoked   Esophageal cancer Maternal Aunt    Lung cancer Maternal Aunt        smoked   Heart disease Maternal Grandfather    Heart disease Maternal Aunt    Heart disease Sister 69       MI   Cancer Brother    Cancer Maternal Uncle    Cancer Maternal Grandmother    Heart disease Sister    Aneurysm Brother    Colon cancer Neg Hx    Colon polyps Neg Hx    Rectal cancer Neg Hx    Stomach cancer Neg Hx     Prior to Admission medications   Medication Sig Start Date End Date Taking? Authorizing Provider  albuterol (PROVENTIL) (2.5 MG/3ML) 0.083% nebulizer  solution Take 3 mLs (2.5 mg total) by nebulization every 6 (six) hours as needed for wheezing or shortness of breath. 03/27/22   Timothy Lasso, MD  albuterol (VENTOLIN HFA) 108 (90 Base) MCG/ACT inhaler Inhale 2 puffs into the lungs every 4 (four) hours as needed for wheezing or shortness of breath. 12/03/22   Serita Butcher, MD  benzonatate (TESSALON) 100 MG capsule Take 1 capsule (100 mg total) by mouth 2 (two) times daily as needed for cough. 10/27/22   Montine Circle, PA-C  clonazePAM (KLONOPIN) 0.5 MG tablet Take 1 tablet (0.5 mg total) by mouth 2 (two) times daily as needed for anxiety. 12/27/21   Tysinger, Camelia Eng, PA-C  fluticasone-salmeterol (ADVAIR HFA) 230-21 MCG/ACT inhaler Inhale 2 puffs into the lungs 2 (two) times daily. 12/03/22   Serita Butcher, MD  naproxen (NAPROSYN) 500 MG tablet Take 1 tablet (500 mg total) by mouth 2 (two) times daily. 11/12/22   Varney Biles, MD  oxyCODONE-acetaminophen (PERCOCET/ROXICET) 5-325 MG tablet Take 1 tablet by mouth every 8 (eight) hours as needed for severe pain. 11/12/22   Varney Biles, MD  pantoprazole (PROTONIX) 40 MG tablet Take 1 tablet (40 mg total) by mouth 2 (two) times daily before a meal. 06/15/22   Spero Geralds, MD  predniSONE (STERAPRED UNI-PAK 21 TAB) 10 MG (21) TBPK tablet Take by mouth daily. Take 6 tabs by mouth daily  for 2 days, then 5 tabs for 2 days, then 4 tabs for 2 days, then 3 tabs for 2 days, 2 tabs for 2 days, then 1 tab by mouth daily for 2 days 11/12/22   Varney Biles, MD  promethazine-dextromethorphan (PROMETHAZINE-DM) 6.25-15 MG/5ML syrup Take 5 mLs by mouth 4 (four) times daily as needed for cough. 06/15/22   Spero Geralds, MD  sertraline (ZOLOFT) 25 MG tablet TAKE 1 TABLET(25 MG) BY MOUTH DAILY Patient taking differently: Take 25 mg by mouth daily. 12/14/21   Carlena Hurl, PA-C    Physical Exam: Vitals:   01/14/23 2120 01/14/23 2130 01/14/23 2154 01/15/23 0120  BP: (!) 141/67 (!) 150/72 (!) 141/75 130/64   Pulse: (!) 131 (!) 128 (!) 120 (!) 121  Resp: (!) 32 19 (!) 26 (!) 38  Temp:   99.7 F (37.6 C) 99.2 F (37.3 C)  TempSrc:   Oral Oral  SpO2: 100% 100% 100% 100%  Weight:      Height:        Physical Exam Vitals and nursing note reviewed.  Constitutional:      Appearance: She is obese. She is ill-appearing. She is not toxic-appearing or diaphoretic.     Comments: Lethargic but arousable  HENT:     Head: Normocephalic and atraumatic.     Nose: Nose normal.  Cardiovascular:     Rate and Rhythm: Regular rhythm. Tachycardia present.     Pulses: Normal pulses.  Pulmonary:     Breath sounds: Decreased air movement present. Examination of the right-upper field reveals decreased breath sounds. Examination of the left-upper field reveals decreased breath sounds. Examination of the right-middle field reveals decreased breath sounds. Examination of the left-middle field reveals decreased breath sounds. Decreased breath sounds present. No wheezing, rhonchi or rales.  Abdominal:     General: Abdomen is protuberant. Bowel sounds are normal. There is no distension.     Palpations: Abdomen is soft.     Tenderness: There is no abdominal tenderness.  Musculoskeletal:     Right lower leg: No edema.     Left lower leg: No edema.  Skin:    General: Skin is warm and dry.     Capillary Refill: Capillary refill takes less than 2 seconds.  Neurological:     General: No focal deficit present.     Mental Status: She is oriented to person, place, and time.      Labs on Admission: I have personally reviewed following labs and imaging studies  CBC: Recent Labs  Lab 01/14/23 1850 01/14/23 1855 01/14/23 2111  WBC 5.7  --   --   NEUTROABS 4.0  --   --   HGB 11.1* 12.6 11.9*  HCT 36.2 37.0 35.0*  MCV 75.4*  --   --   PLT 151  --   --    Basic Metabolic Panel: Recent Labs  Lab 01/14/23 1850 01/14/23 1855 01/14/23 2111  NA 135 138 138  K 3.2* 3.2* 3.2*  CL 100  --   --   CO2 24  --   --    GLUCOSE 103*  --   --   BUN 12  --   --   CREATININE 0.87  --   --   CALCIUM 8.4*  --   --    GFR: Estimated Creatinine Clearance: 114 mL/min (by C-G formula based on SCr of 0.87 mg/dL). Liver Function Tests: Recent Labs  Lab 01/14/23 1850  AST 28  ALT 23  ALKPHOS 65  BILITOT 0.7  PROT 7.8  ALBUMIN 4.0    BNP (last 3 results) Recent Labs    02/01/22 1431 01/14/23 1916  BNP 45.0 42.1    Urine analysis:    Component Value Date/Time   COLORURINE YELLOW 05/04/2022 0140   APPEARANCEUR HAZY (A) 05/04/2022 0140   LABSPEC >=1.030 05/04/2022 0140   LABSPEC 1.020 10/05/2020 1402   PHURINE 6.0 05/04/2022 0140   GLUCOSEU NEGATIVE 05/04/2022 0140   HGBUR LARGE (A) 05/04/2022 0140   BILIRUBINUR NEGATIVE 05/04/2022 0140   BILIRUBINUR negative 10/05/2020 1402   KETONESUR NEGATIVE 05/04/2022 0140   PROTEINUR NEGATIVE 05/04/2022 0140   UROBILINOGEN 1.0 11/20/2014 1446   NITRITE NEGATIVE 05/04/2022 0140   LEUKOCYTESUR NEGATIVE 05/04/2022 0140    Radiological Exams on Admission: I have personally reviewed images CT Angio Chest PE W and/or Wo Contrast  Result Date: 01/14/2023 CLINICAL DATA:  Pulmonary embolism (PE) suspected, high prob. Cough, wheezing EXAM: CT ANGIOGRAPHY CHEST WITH CONTRAST TECHNIQUE: Multidetector CT imaging of the chest was performed using the standard protocol during bolus administration of intravenous contrast. Multiplanar CT image reconstructions and MIPs were obtained to evaluate the vascular anatomy. RADIATION DOSE REDUCTION: This exam was performed according to the departmental dose-optimization program  which includes automated exposure control, adjustment of the mA and/or kV according to patient size and/or use of iterative reconstruction technique. CONTRAST:  154mL OMNIPAQUE IOHEXOL 350 MG/ML SOLN COMPARISON:  01/03/2022 FINDINGS: Cardiovascular: Imaging slightly limited by motion artifact, particular within the lung bases. Satisfactory opacification of the  pulmonary arteries to the segmental level. No definite evidence of pulmonary embolism. Normal heart size. No pericardial effusion. Mediastinum/Nodes: No enlarged mediastinal, hilar, or axillary lymph nodes. Thyroid gland, trachea, and esophagus demonstrate no significant findings. Lungs/Pleura: Minimal left basilar scarring again noted. Lungs are otherwise clear. No pneumothorax or pleural effusion. Upper Abdomen: No acute abnormality. Musculoskeletal: No chest wall abnormality. No acute or significant osseous findings. Review of the MIP images confirms the above findings. IMPRESSION: 1. Imaging slightly limited by motion artifact, particular within the lung bases. No definite evidence of pulmonary embolism. Electronically Signed   By: Fidela Salisbury M.D.   On: 01/14/2023 20:29    EKG: My personal interpretation of EKG shows: sinus tachycardia    Assessment/Plan Principal Problem:   Acute respiratory failure with hypoxia (HCC) Active Problems:   Viral sepsis (HCC)   Influenza A   Hypokalemia   BMI 38.0-38.9,adult   Gastroesophageal reflux disease   Irritable larynx syndrome    Assessment and Plan: * Acute respiratory failure with hypoxia (HCC) Admit to progressive unit. Continue with supplemental O2. Check ABG. May need to restart Bipap. Pt is a full code. Keep npo for now given her work of breathing and lethargy.  Influenza A Continue with tamiflu.  Viral sepsis (HCC) Febrile to 102.9, HR 162. Positive for influenza. CTPA negative for pneumonia.  Gastroesophageal reflux disease Continue with protonix bid.  BMI 38.0-38.9,adult Chronic. BMI 38.88  Hypokalemia Replete with IV kcl.  Irritable larynx syndrome Continue with PPI.   DVT prophylaxis: SQ Heparin Code Status: Full Code Family Communication: no family at bedside  Disposition Plan: return home  Consults called: none  Admission status: Inpatient,  progressive   Kristopher Oppenheim, DO [facility] 01/15/2023, 2:12 AM

## 2023-01-15 NOTE — Assessment & Plan Note (Signed)
Continue with protonix bid.

## 2023-01-15 NOTE — TOC Initial Note (Signed)
Transition of Care [facility]) - Initial/Assessment Note    Patient Details  Name: [name] MRN: [id number] Date of Birth: 01-Jan-1976  Transition of Care [facility]) CM/SW Contact:    Ninfa Meeker, RN Phone Number: 01/15/2023, 11:22 AM  Clinical Narrative:                   Transition of Care [facility]) Department has reviewed patient and no TOC needs have been identified at this time. We will continue to monitor patient advancement through Interdisciplinary progressions and if new patient needs arise, please place a consult.   Patient Goals and CMS Choice            Expected Discharge Plan and Services                                              Prior Living Arrangements/Services                       Activities of Daily Living Home Assistive Devices/Equipment: None ADL Screening (condition at time of admission) Patient's cognitive ability adequate to safely complete daily activities?: No Is the patient deaf or have difficulty hearing?: No Does the patient have difficulty seeing, even when wearing glasses/contacts?: No Does the patient have difficulty concentrating, remembering, or making decisions?: No Patient able to express need for assistance with ADLs?: No Does the patient have difficulty dressing or bathing?: No Independently performs ADLs?: Yes (appropriate for developmental age) Does the patient have difficulty walking or climbing stairs?: No Weakness of Legs: None Weakness of Arms/Hands: None  Permission Sought/Granted                  Emotional Assessment              Admission diagnosis:  Respiratory distress [R06.03] Influenza A [J10.1] Acute respiratory failure with hypoxia ([place]) [J96.01] Patient Active Problem List   Diagnosis Date Noted   Influenza A 01/15/2023   Irritable larynx syndrome 01/15/2023   Acute respiratory failure with hypoxia ([place]) 01/14/2023   Thyroid mass 12/03/2022   Abnormal uterine bleeding  (AUB) 07/26/2022   Viral sepsis ([place]) 12/24/2021   GERD without esophagitis 12/24/2021   Depression, unspecified 12/24/2021   Other fatigue 11/17/2021   Polydipsia 11/17/2021   Polyuria 11/17/2021   Iron deficiency anemia due to chronic blood loss 10/05/2020   Dysmenorrhea 10/05/2020   Uterine leiomyoma 10/05/2020   BMI 38.0-38.9,adult 10/05/2020   Gastroesophageal reflux disease 10/05/2020   Family history of DVT 03/21/2020   Plantar fasciitis 12/28/2019   Chronic asthma 07/31/2013   Waking at night short of breath 06/09/2013   Anemia 01/01/2012   Hypokalemia 01/01/2012   PCP:  Serita Butcher, MD Pharmacy:   [id number] - [place], [address] [place] [address] [place] [place] [address] Phone: [phone] Fax: [phone]     Social Determinants of Health (SDOH) Social History: SDOH Screenings   Food Insecurity: No Food Insecurity (01/15/2023)  Housing: Low Risk  (01/15/2023)  Transportation Needs: No Transportation Needs (01/15/2023)  Utilities: Not At Risk (01/15/2023)  Depression (PHQ2-9): High Risk (12/03/2022)  Tobacco Use: Low Risk  (01/15/2023)   SDOH Interventions:     Readmission Risk Interventions     No data to display

## 2023-01-15 NOTE — Assessment & Plan Note (Signed)
Chronic. BMI 38.88

## 2023-01-15 NOTE — Assessment & Plan Note (Signed)
-   Continue with PPI 

## 2023-01-15 NOTE — Assessment & Plan Note (Addendum)
Admit to progressive unit. Continue with supplemental O2. Check ABG. May need to restart Bipap. Pt is a full code. Keep npo for now given her work of breathing and lethargy.

## 2023-01-15 NOTE — Progress Notes (Signed)
Patient admitted earlier this morning.  H&P reviewed.  Patient seen and examined.  Patient mentioned that she is feeling better today compared to yesterday.  Less short of breath.  Her daughter is at the bedside.  Vital signs reviewed.  Noted to have temperature of 102.9 degrees yesterday evening.  Noted to be 97.9 F this morning.  Oxygen saturations are in the 90s send she is noted to be on 1 L of oxygen by nasal cannula this morning.  Noted to be slightly tachypneic.  Slightly tachycardic.  Blood pressure stable.  Coarse breath sounds heard bilaterally.  Crackles at the bases bilaterally.  No wheezing or rhonchi. S1-S2 is slightly tachycardic but regular  Abdomen is soft.  Nontender nondistended.  Labs reviewed.  Microcytic anemia noted.    Patient admitted with acute respiratory failure with hypoxia secondary to influenza.  CT angiogram did not show any PE.  No pneumonia identified on imaging studies.  Was requiring high flow nasal cannula and BiPAP initially.  Now noted to be on 1 L of oxygen by nasal cannula saturating in the 90s. Continue with Tamiflu.  Nebulizer treatments as needed.  Tylenol for fever.  Continue with supportive care.  Lactic acid was noted to be elevated last night at 3.2.  She has received IV fluids.  Will recheck lactic acid level.  Drop in hemoglobin is likely dilutional.  No overt bleeding noted.  Check anemia panel in the morning.  Other issues as per H&P.  Will continue to follow.  Bonnielee Haff 01/15/2023

## 2023-01-15 NOTE — Subjective & Objective (Signed)
CC: myalgia, SOB HPI: 47 year old African-American female history of morbid obesity BMI 38.8, reflux, dysmenorrhea who presents to the ER today with a less than 24-hour history of myalgias, fever and cough.  She states that her son is sick with viral illness.  Patient works at [employer] in [employer].  She did receive her influenza vaccine this season.  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  She follows with [facility] for what was initially thought to be asthma however her symptoms do not improve with prednisone and albuterol and it was felt the patient had irritable larynx syndrome worsened by reflux.  On arrival to the ER, patient was febrile to 102.9 heart rate 162 blood pressure 160/131.  She had increased work of breathing and was placed on BiPAP.   Labs showed a white count of 5.7, hemoglobin 11.1, platelets of 151  Influenza A was positive, COVID-negative, RSV negative, influenza B-  Sodium 135, potassium 3.2, BUN of 12, creatinine 0.8, glucose of 103, bicarbonate of 24  Initial lactic acid was elevated 2.4.  BNP normal at 42.  Given her family history of DVT, or increased work of breathing, CTPA was obtained.  This was negative for pulmonary embolism.  There is no pneumonia.  There is some scarring in the left base compared to her prior CT scan in March 2023.  Due to continued oxygen requirement, [facility] hospitalist contacted for admission.

## 2023-01-15 NOTE — Assessment & Plan Note (Signed)
Replete with IV kcl 

## 2023-01-15 NOTE — Assessment & Plan Note (Signed)
Febrile to 102.9, HR 162. Positive for influenza. CTPA negative for pneumonia.

## 2023-01-15 NOTE — Assessment & Plan Note (Signed)
Continue with tamiflu.

## 2023-01-16 DIAGNOSIS — J9601 Acute respiratory failure with hypoxia: Secondary | ICD-10-CM | POA: Diagnosis not present

## 2023-01-16 LAB — CBC
HCT: 37.4 % (ref 36.0–46.0)
Hemoglobin: 11.1 g/dL — ABNORMAL LOW (ref 12.0–15.0)
MCH: 23.1 pg — ABNORMAL LOW (ref 26.0–34.0)
MCHC: 29.7 g/dL — ABNORMAL LOW (ref 30.0–36.0)
MCV: 77.9 fL — ABNORMAL LOW (ref 80.0–100.0)
Platelets: 182 10*3/uL (ref 150–400)
RBC: 4.8 MIL/uL (ref 3.87–5.11)
RDW: 14 % (ref 11.5–15.5)
WBC: 5.9 10*3/uL (ref 4.0–10.5)
nRBC: 0 % (ref 0.0–0.2)

## 2023-01-16 LAB — GASTROINTESTINAL PANEL BY PCR, STOOL (REPLACES STOOL CULTURE)

## 2023-01-16 LAB — COMPREHENSIVE METABOLIC PANEL
ALT: 19 U/L (ref 0–44)
AST: 20 U/L (ref 15–41)
Albumin: 3.8 g/dL (ref 3.5–5.0)
Alkaline Phosphatase: 54 U/L (ref 38–126)
Anion gap: 7 (ref 5–15)
BUN: 12 mg/dL (ref 6–20)
CO2: 23 mmol/L (ref 22–32)
Calcium: 8.2 mg/dL — ABNORMAL LOW (ref 8.9–10.3)
Chloride: 109 mmol/L (ref 98–111)
Creatinine, Ser: 0.79 mg/dL (ref 0.44–1.00)
GFR, Estimated: >60 mL/min (ref >60)
Glucose, Bld: 115 mg/dL — ABNORMAL HIGH (ref 70–99)
Potassium: 3.9 mmol/L (ref 3.5–5.1)
Sodium: 139 mmol/L (ref 135–145)
Total Bilirubin: 0.5 mg/dL (ref 0.3–1.2)
Total Protein: 6.9 g/dL (ref 6.5–8.1)

## 2023-01-16 LAB — C DIFFICILE QUICK SCREEN W PCR REFLEX
C Diff antigen: NEGATIVE
C Diff interpretation: NOT DETECTED
C Diff toxin: NEGATIVE

## 2023-01-16 LAB — RETICULOCYTES
Immature Retic Fract: 21.7 % — ABNORMAL HIGH (ref 2.3–15.9)
RBC.: 4.76 MIL/uL (ref 3.87–5.11)
Retic Count, Absolute: 90.4 10*3/uL (ref 19.0–186.0)
Retic Ct Pct: 1.9 % (ref 0.4–3.1)

## 2023-01-16 LAB — IRON AND TIBC
Iron: 62 ug/dL (ref 28–170)
Saturation Ratios: 19 % (ref 10.4–31.8)
TIBC: 326 ug/dL (ref 250–450)
UIBC: 264 ug/dL

## 2023-01-16 LAB — VITAMIN B12: Vitamin B-12: 437 pg/mL (ref 180–914)

## 2023-01-16 LAB — FOLATE: Folate: 12.8 ng/mL (ref >5.9)

## 2023-01-16 LAB — FERRITIN: Ferritin: 67 ng/mL (ref 11–307)

## 2023-01-16 MED ORDER — IPRATROPIUM-ALBUTEROL 0.5-2.5 (3) MG/3ML IN SOLN
3.0000 mL | Freq: Three times a day (TID) | RESPIRATORY_TRACT | Status: DC
  Administered 2023-01-16 – 2023-01-17 (×3): 3 mL via RESPIRATORY_TRACT
  Filled 2023-01-16 (×3): qty 3

## 2023-01-16 MED ORDER — GUAIFENESIN ER 600 MG PO TB12
600.0000 mg | ORAL_TABLET | Freq: Two times a day (BID) | ORAL | Status: DC
  Administered 2023-01-16 – 2023-01-17 (×3): 600 mg via ORAL
  Filled 2023-01-16 (×3): qty 1

## 2023-01-16 MED ORDER — SACCHAROMYCES BOULARDII 250 MG PO CAPS
250.0000 mg | ORAL_CAPSULE | Freq: Two times a day (BID) | ORAL | Status: DC
  Administered 2023-01-16 – 2023-01-17 (×3): 250 mg via ORAL
  Filled 2023-01-16 (×3): qty 1

## 2023-01-16 MED ORDER — HEPARIN SODIUM (PORCINE) 5000 UNIT/ML IJ SOLN
5000.0000 [IU] | Freq: Three times a day (TID) | INTRAMUSCULAR | Status: DC
  Administered 2023-01-16 – 2023-01-17 (×2): 5000 [IU] via SUBCUTANEOUS
  Filled 2023-01-16 (×2): qty 1

## 2023-01-16 NOTE — Plan of Care (Signed)

## 2023-01-16 NOTE — Progress Notes (Addendum)
PROGRESS NOTE    [name]  [id number] DOB: [birth year] DOA: 01/14/2023 PCP: Serita Butcher, MD   Brief Narrative: 47 year old with past medical history significant for obesity, reflux, dysmenorrhea presents complaining of myalgia, fever and cough.  She was found to be febrile, increased work of breathing and placed on BiPAP.  She was found to have influenza A+.  CT chest negative for PE.   She received in the ED IV Solu-Medrol, antibiotics.   Assessment & Plan:   Principal Problem:   Acute respiratory failure with hypoxia (HCC) Active Problems:   Viral sepsis (HCC)   Influenza A   Hypokalemia   BMI 38.0-38.9,adult   Gastroesophageal reflux disease   Irritable larynx syndrome   1-Acute hypoxic respiratory failure in the setting of influenza A -Initially placed on BiPAP in the ED.  She has been transitioned to oxygen -Continue with Tamiflu -Start guaifenesin -Scheduled nebulizers  2-Severe Viral Sepsis; lactic acidosis In the setting of influenza.  She was tachycardic febrile. Received IV fluids Lactic acid normalized  3-Diarrhea; Check for C diff, GI pathogen.   GERD: Continue with Protonix twice daily  Obesity: BMI 38: Need lifestyle modification Hypokalemia: Replace orally Irritable  larynx syndrome: Continue with PPI Anemia; In hemoglobin dilutional.  When stable.  Iron and B12 normal   Estimated body mass index is 38.88 kg/m as calculated from the following:   Height as of this encounter: 5\' 10"  (1.778 m).   Weight as of this encounter: 122.9 kg.   DVT prophylaxis: Lovenox Code Status: Full code Family Communication: Care discussed with patient.  Disposition Plan:  Status is: Inpatient Remains inpatient appropriate because: management of influenza    Consultants:  None  Procedures:  noine  Antimicrobials:    Subjective: Reports diarrhea multiple bowel movement.  Denies abdominal pain.  Reports cough.  Reports improvement of  shortness of breath.  Objective: Vitals:   01/16/23 0439 01/16/23 0859 01/16/23 1126 01/16/23 1500  BP: (!) 131/58   130/76  Pulse: 79   88  Resp: (!) 22   (!) 21  Temp: 99.2 F (37.3 C)   99 F (37.2 C)  TempSrc: Oral   Oral  SpO2: 96% 95% 92% 99%  Weight:      Height:        Intake/Output Summary (Last 24 hours) at 01/16/2023 1601 Last data filed at 01/16/2023 1300 Gross per 24 hour  Intake 1200 ml  Output 1 ml  Net 1199 ml   Filed Weights   01/14/23 1809  Weight: 122.9 kg    Examination:  General exam: Appears calm and comfortable  Respiratory system: BL ronchus Cardiovascular system: S1 & S2 heard, RRR. No JVD, murmurs, rubs, gallops or clicks. No pedal edema. Gastrointestinal system: Abdomen is nondistended, soft and nontender. No organomegaly or masses felt. Normal bowel sounds heard. Central nervous system: Alert and oriented. No focal neurological deficits. Extremities: Symmetric 5 x 5 power.   Data Reviewed: I have personally reviewed following labs and imaging studies  CBC: Recent Labs  Lab 01/14/23 1850 01/14/23 1855 01/14/23 2111 01/15/23 0446 01/16/23 0850  WBC 5.7  --   --  5.7 5.9  NEUTROABS 4.0  --   --  5.0  --   HGB 11.1* 12.6 11.9* 9.7* 11.1*  HCT 36.2 37.0 35.0* 32.0* 37.4  MCV 75.4*  --   --  76.0* 77.9*  PLT 151  --   --  180 [id number]   Basic Metabolic Panel: Recent Labs  Lab 01/14/23 1850 01/14/23 1855 01/14/23 2111 01/15/23 0446 01/16/23 0850  NA 135 138 138 139 139  K 3.2* 3.2* 3.2* 3.8 3.9  CL 100  --   --  106 109  CO2 24  --   --  21* 23  GLUCOSE 103*  --   --  159* 115*  BUN 12  --   --  9 12  CREATININE 0.87  --   --  0.70 0.79  CALCIUM 8.4*  --   --  8.2* 8.2*  MG  --   --   --  2.1  --    GFR: Estimated Creatinine Clearance: 123.9 mL/min (by C-G formula based on SCr of 0.79 mg/dL). Liver Function Tests: Recent Labs  Lab 01/14/23 1850 01/15/23 0446 01/16/23 0850  AST 28 17 20   ALT 23 17 19   ALKPHOS 65 55 54   BILITOT 0.7 0.6 0.5  PROT 7.8 6.9 6.9  ALBUMIN 4.0 3.6 3.8   No results for input(s): "LIPASE", "AMYLASE" in the last 168 hours. No results for input(s): "AMMONIA" in the last 168 hours. Coagulation Profile: No results for input(s): "INR", "PROTIME" in the last 168 hours. Cardiac Enzymes: No results for input(s): "CKTOTAL", "CKMB", "CKMBINDEX", "TROPONINI" in the last 168 hours. BNP (last 3 results) No results for input(s): "PROBNP" in the last 8760 hours. HbA1C: No results for input(s): "HGBA1C" in the last 72 hours. CBG: No results for input(s): "GLUCAP" in the last 168 hours. Lipid Profile: No results for input(s): "CHOL", "HDL", "LDLCALC", "TRIG", "CHOLHDL", "LDLDIRECT" in the last 72 hours. Thyroid Function Tests: No results for input(s): "TSH", "T4TOTAL", "FREET4", "T3FREE", "THYROIDAB" in the last 72 hours. Anemia Panel: Recent Labs    01/16/23 0850  VITAMINB12 437  FOLATE 12.8  FERRITIN 67  TIBC 326  IRON 62  RETICCTPCT 1.9   Sepsis Labs: Recent Labs  Lab 01/14/23 1850 01/14/23 2100 01/15/23 1015  LATICACIDVEN 2.4* 3.2* 1.2    Recent Results (from the past 240 hour(s))  Blood Culture (routine x 2)     Status: None (Preliminary result)   Collection Time: 01/14/23  6:00 PM   Specimen: BLOOD  Result Value Ref Range Status   Specimen Description   Final    BLOOD LEFT ANTECUBITAL Performed at [facility], [address] [place], [place] [address]    Special Requests   Final    BOTTLES DRAWN AEROBIC AND ANAEROBIC Blood Culture results may not be optimal due to an inadequate volume of blood received in culture bottles Performed at [facility], [address] [place], [place] [address]    Culture   Final    NO GROWTH 2 DAYS Performed at [facility], [place] [address], [place], [place] [address]    Report Status PENDING  Incomplete  Blood Culture (routine x 2)     Status: None (Preliminary result)   Collection Time: 01/14/23   6:00 PM   Specimen: BLOOD  Result Value Ref Range Status   Specimen Description   Final    BLOOD RIGHT ANTECUBITAL Performed at [facility], [address] [place], [place] [address]    Special Requests   Final    BOTTLES DRAWN AEROBIC AND ANAEROBIC Blood Culture results may not be optimal due to an inadequate volume of blood received in culture bottles Performed at [facility], [address] [place], [place] [address]    Culture  Final    NO GROWTH 2 DAYS Performed at [facility], [place] [address] [place], [place] [address]    Report Status PENDING  Incomplete  Resp panel by RT-PCR (RSV, Flu A&B, Covid) Anterior Nasal Swab     Status: Abnormal   Collection Time: 01/14/23  6:16 PM   Specimen: Anterior Nasal Swab  Result Value Ref Range Status   SARS Coronavirus 2 by RT PCR NEGATIVE NEGATIVE Final    Comment: (NOTE) SARS-CoV-2 target nucleic acids are NOT DETECTED.  The SARS-CoV-2 RNA is generally detectable in upper respiratory specimens during the acute phase of infection. The lowest concentration of SARS-CoV-2 viral copies this assay can detect is 138 copies/mL. A negative result does not preclude SARS-Cov-2 infection and should not be used as the sole basis for treatment or other patient management decisions. A negative result may occur with  improper specimen collection/handling, submission of specimen other than nasopharyngeal swab, presence of viral mutation(s) within the areas targeted by this assay, and inadequate number of viral copies(<138 copies/mL). A negative result must be combined with clinical observations, patient history, and epidemiological information. The expected result is Negative.  Fact Sheet for Patients:  EntrepreneurPulse.com.au  Fact Sheet for Healthcare Providers:  IncredibleEmployment.be  This test is no t yet approved or cleared by the [place] FDA and  has been  authorized for detection and/or diagnosis of SARS-CoV-2 by FDA under an Emergency Use Authorization (EUA). This EUA will remain  in effect (meaning this test can be used) for the duration of the COVID-19 declaration under Section 564(b)(1) of the Act, 21 U.S.C.section 360bbb-3(b)(1), unless the authorization is terminated  or revoked sooner.       Influenza A by PCR POSITIVE (A) NEGATIVE Final   Influenza B by PCR NEGATIVE NEGATIVE Final    Comment: (NOTE) The Xpert Xpress SARS-CoV-2/FLU/RSV plus assay is intended as an aid in the diagnosis of influenza from Nasopharyngeal swab specimens and should not be used as a sole basis for treatment. Nasal washings and aspirates are unacceptable for Xpert Xpress SARS-CoV-2/FLU/RSV testing.  Fact Sheet for Patients: EntrepreneurPulse.com.au  Fact Sheet for Healthcare Providers: IncredibleEmployment.be  This test is not yet approved or cleared by the [place] FDA and has been authorized for detection and/or diagnosis of SARS-CoV-2 by FDA under an Emergency Use Authorization (EUA). This EUA will remain in effect (meaning this test can be used) for the duration of the COVID-19 declaration under Section 564(b)(1) of the Act, 21 U.S.C. section 360bbb-3(b)(1), unless the authorization is terminated or revoked.     Resp Syncytial Virus by PCR NEGATIVE NEGATIVE Final    Comment: (NOTE) Fact Sheet for Patients: EntrepreneurPulse.com.au  Fact Sheet for Healthcare Providers: IncredibleEmployment.be  This test is not yet approved or cleared by the [place] FDA and has been authorized for detection and/or diagnosis of SARS-CoV-2 by FDA under an Emergency Use Authorization (EUA). This EUA will remain in effect (meaning this test can be used) for the duration of the COVID-19 declaration under Section 564(b)(1) of the Act, 21 U.S.C. section 360bbb-3(b)(1), unless the  authorization is terminated or revoked.  Performed at [facility], [address] [place], [place] [address]   C Difficile Quick Screen w PCR reflex     Status: None   Collection Time: 01/16/23 12:04 PM   Specimen: Stool  Result Value Ref Range Status   C Diff antigen NEGATIVE NEGATIVE Final   C Diff toxin NEGATIVE NEGATIVE Final  C Diff interpretation No C. difficile detected.  Final    Comment: Performed at [facility], [place] [address], [place], [place] [address]         Radiology Studies: CT Angio Chest PE W and/or Wo Contrast  Result Date: 01/14/2023 CLINICAL DATA:  Pulmonary embolism (PE) suspected, high prob. Cough, wheezing EXAM: CT ANGIOGRAPHY CHEST WITH CONTRAST TECHNIQUE: Multidetector CT imaging of the chest was performed using the standard protocol during bolus administration of intravenous contrast. Multiplanar CT image reconstructions and MIPs were obtained to evaluate the vascular anatomy. RADIATION DOSE REDUCTION: This exam was performed according to the departmental dose-optimization program which includes automated exposure control, adjustment of the mA and/or kV according to patient size and/or use of iterative reconstruction technique. CONTRAST:  148mL OMNIPAQUE IOHEXOL 350 MG/ML SOLN COMPARISON:  01/03/2022 FINDINGS: Cardiovascular: Imaging slightly limited by motion artifact, particular within the lung bases. Satisfactory opacification of the pulmonary arteries to the segmental level. No definite evidence of pulmonary embolism. Normal heart size. No pericardial effusion. Mediastinum/Nodes: No enlarged mediastinal, hilar, or axillary lymph nodes. Thyroid gland, trachea, and esophagus demonstrate no significant findings. Lungs/Pleura: Minimal left basilar scarring again noted. Lungs are otherwise clear. No pneumothorax or pleural effusion. Upper Abdomen: No acute abnormality. Musculoskeletal: No chest wall abnormality. No acute or  significant osseous findings. Review of the MIP images confirms the above findings. IMPRESSION: 1. Imaging slightly limited by motion artifact, particular within the lung bases. No definite evidence of pulmonary embolism. Electronically Signed   By: Fidela Salisbury M.D.   On: 01/14/2023 20:29        Scheduled Meds:  enoxaparin (LOVENOX) injection  40 mg Subcutaneous Q24H   guaiFENesin  600 mg Oral BID   ipratropium-albuterol  3 mL Nebulization TID   mometasone-formoterol  2 puff Inhalation BID   oseltamivir  75 mg Oral BID   pantoprazole (PROTONIX) IV  40 mg Intravenous Q12H   saccharomyces boulardii  250 mg Oral BID   Continuous Infusions:  sodium chloride       LOS: 1 day    Time spent: 35 minutes    Anjenette Gerbino A Jamarl Pew, MD [facility]   If 7PM-7AM, please contact night-coverage [email or web address]  01/16/2023, 4:01 PM

## 2023-01-16 NOTE — Progress Notes (Signed)
   01/15/23 0120  Assess: MEWS Score  Temp 99.2 F (37.3 C)  BP 130/64  MAP (mmHg) 80  Pulse Rate (!) 121  ECG Heart Rate (!) 120  Resp (!) 38  Level of Consciousness Alert  SpO2 100 %  O2 Device HFNC  O2 Flow Rate (L/min) 6 L/min  Assess: MEWS Score  MEWS Temp 0  MEWS Systolic 0  MEWS Pulse 2  MEWS RR 3  MEWS LOC 0  MEWS Score 5  MEWS Score Color Red  Assess: if the MEWS score is Yellow or Red  Were vital signs taken at a resting state? Yes  Focused Assessment No change from prior assessment  Does the patient meet 2 or more of the SIRS criteria? Yes  Does the patient have a confirmed or suspected source of infection? Yes  Provider and Rapid Response Notified? No  MEWS guidelines implemented  Yes, red  Treat  MEWS Interventions Considered administering scheduled or prn medications/treatments as ordered  Take Vital Signs  Increase Vital Sign Frequency  Red: Q1hr x2, continue Q4hrs until patient remains green for 12hrs  Escalate  MEWS: Escalate Red: Discuss with charge nurse and notify provider. Consider notifying RRT. If remains red for 2 hours consider need for higher level of care  Notify: Charge Nurse/RN  Name of Charge Nurse/RN Notified [facility]  Provider Notification  Provider Name/Title Kristopher Oppenheim  Provider response Other (Comment) (see progress note (H&P))  Assess: SIRS CRITERIA  SIRS Temperature  0  SIRS Pulse 1  SIRS Respirations  1  SIRS WBC 0  SIRS Score Sum  2

## 2023-01-17 DIAGNOSIS — J9601 Acute respiratory failure with hypoxia: Secondary | ICD-10-CM | POA: Diagnosis not present

## 2023-01-17 MED ORDER — ALBUTEROL SULFATE HFA 108 (90 BASE) MCG/ACT IN AERS
2.0000 | INHALATION_SPRAY | RESPIRATORY_TRACT | 1 refills | Status: DC | PRN
  Filled 2023-11-20: qty 6.7, 30d supply, fill #0

## 2023-01-17 MED ORDER — BUTALBITAL-APAP-CAFFEINE 50-325-40 MG PO TABS
2.0000 | ORAL_TABLET | ORAL | Status: AC | PRN
  Administered 2023-01-17: 2 via ORAL
  Filled 2023-01-17: qty 2

## 2023-01-17 MED ORDER — IPRATROPIUM-ALBUTEROL 0.5-2.5 (3) MG/3ML IN SOLN: 3.0000 mL | Freq: Two times a day (BID) | RESPIRATORY_TRACT | Status: DC

## 2023-01-17 MED ORDER — IPRATROPIUM-ALBUTEROL 0.5-2.5 (3) MG/3ML IN SOLN: 3.0000 mL | RESPIRATORY_TRACT | Status: DC | PRN

## 2023-01-17 MED ORDER — PANTOPRAZOLE SODIUM 40 MG PO TBEC: 40.0000 mg | DELAYED_RELEASE_TABLET | Freq: Two times a day (BID) | ORAL | Status: DC

## 2023-01-17 MED ORDER — PANTOPRAZOLE SODIUM 40 MG PO TBEC: 40.0000 mg | DELAYED_RELEASE_TABLET | Freq: Two times a day (BID) | ORAL | 3 refills | Status: DC

## 2023-01-17 MED ORDER — OSELTAMIVIR PHOSPHATE 75 MG PO CAPS: 75.0000 mg | ORAL_CAPSULE | Freq: Two times a day (BID) | ORAL | 0 refills | Status: AC

## 2023-01-17 MED ORDER — DEXTROMETHORPHAN-GUAIFENESIN 10-100 MG/5ML PO LIQD: 5.0000 mL | Freq: Two times a day (BID) | ORAL | 0 refills | Status: DC | PRN

## 2023-01-17 MED ORDER — SERTRALINE HCL 25 MG PO TABS: 25.0000 mg | ORAL_TABLET | Freq: Every day | ORAL | 1 refills | Status: DC

## 2023-01-17 MED ORDER — GUAIFENESIN ER 600 MG PO TB12: 600.0000 mg | ORAL_TABLET | Freq: Two times a day (BID) | ORAL | 0 refills | Status: DC

## 2023-01-17 MED ORDER — FLUTICASONE-SALMETEROL 230-21 MCG/ACT IN AERO: 1.0000 | INHALATION_SPRAY | Freq: Two times a day (BID) | RESPIRATORY_TRACT | 1 refills | Status: DC

## 2023-01-17 NOTE — Progress Notes (Signed)
Patient's discharge instructions explained with patient. All questions answered regarding prescriptions and pharmacy verified. Encouraged patient to follow-up with new PCP.

## 2023-01-17 NOTE — Discharge Summary (Signed)
Physician Discharge Summary   Patient: [name] MRN: [id number] DOB: 07-Aug-1976  Admit date:     01/14/2023  Discharge date: 01/17/23  Discharge Physician: Elmarie Shiley   PCP: Serita Butcher, MD   Recommendations at discharge:   Needs further evaluation for anemia. Needs screening colonoscopy if she hasn't had one.  Follow up resolution of influenza.    Discharge Diagnoses: Principal Problem:   Acute respiratory failure with hypoxia (HCC) Active Problems:   Viral sepsis (HCC)   Influenza A   Hypokalemia   BMI 38.0-38.9,adult   Gastroesophageal reflux disease   Irritable larynx syndrome  Resolved Problems:   * No resolved hospital problems. *  Hospital Course: 47 year old with past medical history significant for obesity, reflux, dysmenorrhea presents complaining of myalgia, fever and cough.  She was found to be febrile, increased work of breathing and placed on BiPAP.  She was found to have influenza A+.  CT chest negative for PE.    She received in the ED IV Solu-Medrol, antibiotics.    Assessment and Plan: 1-Acute hypoxic respiratory failure in the setting of influenza A -Initially placed on BiPAP in the ED.  She has been transitioned to oxygen -Continue with Tamiflu -Started  guaifenesin -Scheduled nebulizers She is feeling better, currently on RA.  Stable for discharge home.   2-Severe Viral Sepsis; lactic acidosis In the setting of influenza.  She was tachycardic febrile. Received IV fluids Lactic acid normalized   3-Diarrhea; C diff, GI pathgen negative.  Resolved.  Likely in setting of influenza.    GERD: Continue with Protonix twice daily   Obesity: BMI 38: Need lifestyle modification Hypokalemia: Replace orally Irritable  larynx syndrome: Continue with PPI Anemia; hemoglobin drop dilutional.   Iron and B12 normal  Hb stable. Needs screening colonoscopy          Consultants: None Procedures performed: None Disposition:  Home Diet recommendation:  Discharge Diet Orders (From admission, onward)     Start     Ordered   01/17/23 0000  Diet - low sodium heart healthy        01/17/23 1031           Cardiac diet DISCHARGE MEDICATION: Allergies as of 01/17/2023   No Known Allergies      Medication List     STOP taking these medications    benzonatate 100 MG capsule Commonly known as: TESSALON   ibuprofen 200 MG tablet Commonly known as: ADVIL   naproxen 500 MG tablet Commonly known as: NAPROSYN   promethazine-dextromethorphan 6.25-15 MG/5ML syrup Commonly known as: PROMETHAZINE-DM       TAKE these medications    acetaminophen 500 MG tablet Commonly known as: TYLENOL Take 500 mg by mouth as needed for moderate pain.   albuterol 108 (90 Base) MCG/ACT inhaler Commonly known as: VENTOLIN HFA Inhale 2 puffs into the lungs every 4 (four) hours as needed for wheezing or shortness of breath. What changed: Another medication with the same name was removed. Continue taking this medication, and follow the directions you see here.   clonazePAM 0.5 MG tablet Commonly known as: KLONOPIN Take 1 tablet (0.5 mg total) by mouth 2 (two) times daily as needed for anxiety. What changed:  when to take this reasons to take this   dextromethorphan-guaiFENesin 10-100 MG/5ML liquid Commonly known as: ROBITUSSIN-DM Take 5 mLs by mouth 2 (two) times daily as needed for cough.   fluticasone-salmeterol 230-21 MCG/ACT inhaler Commonly known as: Advair HFA Inhale 1 puff  into the lungs 2 (two) times daily.   guaiFENesin 600 MG 12 hr tablet Commonly known as: MUCINEX Take 1 tablet (600 mg total) by mouth 2 (two) times daily.   oseltamivir 75 MG capsule Commonly known as: TAMIFLU Take 1 capsule (75 mg total) by mouth 2 (two) times daily for 3 days.   pantoprazole 40 MG tablet Commonly known as: PROTONIX Take 1 tablet (40 mg total) by mouth 2 (two) times daily before a meal.   sertraline 25 MG  tablet Commonly known as: ZOLOFT Take 1 tablet (25 mg total) by mouth daily. What changed: See the new instructions.        Discharge Exam: Filed Weights   01/14/23 1809 01/17/23 0425  Weight: 122.9 kg 127.1 kg   General; NAD  Condition at discharge: stable  The results of significant diagnostics from this hospitalization (including imaging, microbiology, ancillary and laboratory) are listed below for reference.   Imaging Studies: CT Angio Chest PE W and/or Wo Contrast  Result Date: 01/14/2023 CLINICAL DATA:  Pulmonary embolism (PE) suspected, high prob. Cough, wheezing EXAM: CT ANGIOGRAPHY CHEST WITH CONTRAST TECHNIQUE: Multidetector CT imaging of the chest was performed using the standard protocol during bolus administration of intravenous contrast. Multiplanar CT image reconstructions and MIPs were obtained to evaluate the vascular anatomy. RADIATION DOSE REDUCTION: This exam was performed according to the departmental dose-optimization program which includes automated exposure control, adjustment of the mA and/or kV according to patient size and/or use of iterative reconstruction technique. CONTRAST:  144mL OMNIPAQUE IOHEXOL 350 MG/ML SOLN COMPARISON:  01/03/2022 FINDINGS: Cardiovascular: Imaging slightly limited by motion artifact, particular within the lung bases. Satisfactory opacification of the pulmonary arteries to the segmental level. No definite evidence of pulmonary embolism. Normal heart size. No pericardial effusion. Mediastinum/Nodes: No enlarged mediastinal, hilar, or axillary lymph nodes. Thyroid gland, trachea, and esophagus demonstrate no significant findings. Lungs/Pleura: Minimal left basilar scarring again noted. Lungs are otherwise clear. No pneumothorax or pleural effusion. Upper Abdomen: No acute abnormality. Musculoskeletal: No chest wall abnormality. No acute or significant osseous findings. Review of the MIP images confirms the above findings. IMPRESSION: 1.  Imaging slightly limited by motion artifact, particular within the lung bases. No definite evidence of pulmonary embolism. Electronically Signed   By: Fidela Salisbury M.D.   On: 01/14/2023 20:29    Microbiology: Results for orders placed or performed during the hospital encounter of 01/14/23  Blood Culture (routine x 2)     Status: None (Preliminary result)   Collection Time: 01/14/23  6:00 PM   Specimen: BLOOD  Result Value Ref Range Status   Specimen Description   Final    BLOOD LEFT ANTECUBITAL Performed at [facility], [address] [place], [place] [address]    Special Requests   Final    BOTTLES DRAWN AEROBIC AND ANAEROBIC Blood Culture results may not be optimal due to an inadequate volume of blood received in culture bottles Performed at [facility], [address] [place], [place] [address]    Culture   Final    NO GROWTH 3 DAYS Performed at [facility], [place] [address] [place], [place] [address]    Report Status PENDING  Incomplete  Blood Culture (routine x 2)     Status: None (Preliminary result)   Collection Time: 01/14/23  6:00 PM   Specimen: BLOOD  Result Value Ref Range Status   Specimen Description   Final    BLOOD RIGHT ANTECUBITAL  Performed at [facility], [address] [place], [place] [address]    Special Requests   Final    BOTTLES DRAWN AEROBIC AND ANAEROBIC Blood Culture results may not be optimal due to an inadequate volume of blood received in culture bottles Performed at [facility], [address] [place], [place] [address]    Culture   Final    NO GROWTH 3 DAYS Performed at [facility], [place] [address] [place], [place] [address]    Report Status PENDING  Incomplete  Resp panel by RT-PCR (RSV, Flu A&B, Covid) Anterior Nasal Swab     Status: Abnormal   Collection Time: 01/14/23  6:16 PM   Specimen: Anterior Nasal Swab  Result Value Ref Range Status   SARS Coronavirus 2 by RT PCR  NEGATIVE NEGATIVE Final    Comment: (NOTE) SARS-CoV-2 target nucleic acids are NOT DETECTED.  The SARS-CoV-2 RNA is generally detectable in upper respiratory specimens during the acute phase of infection. The lowest concentration of SARS-CoV-2 viral copies this assay can detect is 138 copies/mL. A negative result does not preclude SARS-Cov-2 infection and should not be used as the sole basis for treatment or other patient management decisions. A negative result may occur with  improper specimen collection/handling, submission of specimen other than nasopharyngeal swab, presence of viral mutation(s) within the areas targeted by this assay, and inadequate number of viral copies(<138 copies/mL). A negative result must be combined with clinical observations, patient history, and epidemiological information. The expected result is Negative.  Fact Sheet for Patients:  EntrepreneurPulse.com.au  Fact Sheet for Healthcare Providers:  IncredibleEmployment.be  This test is no t yet approved or cleared by the [place] FDA and  has been authorized for detection and/or diagnosis of SARS-CoV-2 by FDA under an Emergency Use Authorization (EUA). This EUA will remain  in effect (meaning this test can be used) for the duration of the COVID-19 declaration under Section 564(b)(1) of the Act, 21 U.S.C.section 360bbb-3(b)(1), unless the authorization is terminated  or revoked sooner.       Influenza A by PCR POSITIVE (A) NEGATIVE Final   Influenza B by PCR NEGATIVE NEGATIVE Final    Comment: (NOTE) The Xpert Xpress SARS-CoV-2/FLU/RSV plus assay is intended as an aid in the diagnosis of influenza from Nasopharyngeal swab specimens and should not be used as a sole basis for treatment. Nasal washings and aspirates are unacceptable for Xpert Xpress SARS-CoV-2/FLU/RSV testing.  Fact Sheet for Patients: EntrepreneurPulse.com.au  Fact Sheet for  Healthcare Providers: IncredibleEmployment.be  This test is not yet approved or cleared by the [place] FDA and has been authorized for detection and/or diagnosis of SARS-CoV-2 by FDA under an Emergency Use Authorization (EUA). This EUA will remain in effect (meaning this test can be used) for the duration of the COVID-19 declaration under Section 564(b)(1) of the Act, 21 U.S.C. section 360bbb-3(b)(1), unless the authorization is terminated or revoked.     Resp Syncytial Virus by PCR NEGATIVE NEGATIVE Final    Comment: (NOTE) Fact Sheet for Patients: EntrepreneurPulse.com.au  Fact Sheet for Healthcare Providers: IncredibleEmployment.be  This test is not yet approved or cleared by the [place] FDA and has been authorized for detection and/or diagnosis of SARS-CoV-2 by FDA under an Emergency Use Authorization (EUA). This EUA will remain in effect (meaning this test can be used) for the duration of the COVID-19 declaration under Section 564(b)(1) of the Act, 21 U.S.C. section 360bbb-3(b)(1), unless the authorization  is terminated or revoked.  Performed at [facility], [address] [place], [place] [address]   Gastrointestinal Panel by PCR , Stool     Status: None   Collection Time: 01/16/23 12:04 PM   Specimen: Stool  Result Value Ref Range Status   Campylobacter species NOT DETECTED NOT DETECTED Final   Plesimonas shigelloides NOT DETECTED NOT DETECTED Final   Salmonella species NOT DETECTED NOT DETECTED Final   Yersinia enterocolitica NOT DETECTED NOT DETECTED Final   Vibrio species NOT DETECTED NOT DETECTED Final   Vibrio cholerae NOT DETECTED NOT DETECTED Final   Enteroaggregative E coli (EAEC) NOT DETECTED NOT DETECTED Final   Enteropathogenic E coli (EPEC) NOT DETECTED NOT DETECTED Final   Enterotoxigenic E coli (ETEC) NOT DETECTED NOT DETECTED Final   Shiga like toxin producing E coli (STEC)  NOT DETECTED NOT DETECTED Final   Shigella/Enteroinvasive E coli (EIEC) NOT DETECTED NOT DETECTED Final   Cryptosporidium NOT DETECTED NOT DETECTED Final   Cyclospora cayetanensis NOT DETECTED NOT DETECTED Final   Entamoeba histolytica NOT DETECTED NOT DETECTED Final   Giardia lamblia NOT DETECTED NOT DETECTED Final   Adenovirus F40/41 NOT DETECTED NOT DETECTED Final   Astrovirus NOT DETECTED NOT DETECTED Final   Norovirus GI/GII NOT DETECTED NOT DETECTED Final   Rotavirus A NOT DETECTED NOT DETECTED Final   Sapovirus (I, II, IV, and V) NOT DETECTED NOT DETECTED Final    Comment: Performed at [facility], [address] [place], [place] [address]  C Difficile Quick Screen w PCR reflex     Status: None   Collection Time: 01/16/23 12:04 PM   Specimen: Stool  Result Value Ref Range Status   C Diff antigen NEGATIVE NEGATIVE Final   C Diff toxin NEGATIVE NEGATIVE Final   C Diff interpretation No C. difficile detected.  Final    Comment: Performed at [facility], [place] [address], [place], [place] [address]    Labs: CBC: Recent Labs  Lab 01/14/23 1850 01/14/23 1855 01/14/23 2111 01/15/23 0446 01/16/23 0850  WBC 5.7  --   --  5.7 5.9  NEUTROABS 4.0  --   --  5.0  --   HGB 11.1* 12.6 11.9* 9.7* 11.1*  HCT 36.2 37.0 35.0* 32.0* 37.4  MCV 75.4*  --   --  76.0* 77.9*  PLT 151  --   --  180 [id number]   Basic Metabolic Panel: Recent Labs  Lab 01/14/23 1850 01/14/23 1855 01/14/23 2111 01/15/23 0446 01/16/23 0850  NA 135 138 138 139 139  K 3.2* 3.2* 3.2* 3.8 3.9  CL 100  --   --  106 109  CO2 24  --   --  21* 23  GLUCOSE 103*  --   --  159* 115*  BUN 12  --   --  9 12  CREATININE 0.87  --   --  0.70 0.79  CALCIUM 8.4*  --   --  8.2* 8.2*  MG  --   --   --  2.1  --    Liver Function Tests: Recent Labs  Lab 01/14/23 1850 01/15/23 0446 01/16/23 0850  AST 28 17 20   ALT 23 17 19   ALKPHOS 65 55 54  BILITOT 0.7 0.6 0.5  PROT 7.8 6.9 6.9  ALBUMIN  4.0 3.6 3.8   CBG: No results for input(s): "GLUCAP" in the last 168 hours.  Discharge time spent: greater than 30 minutes.  SignedJerald Kief  Desiree Lucy, MD [facility] 01/17/2023

## 2023-01-17 NOTE — Plan of Care (Signed)

## 2023-01-18 ENCOUNTER — Telehealth: Payer: Self-pay

## 2023-01-18 NOTE — Transitions of Care (Post Inpatient/ED Visit) (Signed)
   01/18/2023  Name: Solomia Keppler MRN: [id number] DOB: [birth year]  Today's TOC FU Call Status: Today's TOC FU Call Status:: Successful TOC FU Call Competed TOC FU Call Complete Date: 01/18/23  Transition Care Management Follow-up Telephone Call Date of Discharge: 01/17/23 Discharge Facility: [facility]) Type of Discharge: Inpatient Admission Primary Inpatient Discharge Diagnosis:: influenza How have you been since you were released from the hospital?: Better  Items Reviewed: Did you receive and understand the discharge instructions provided?: Yes Medications obtained and verified?: Yes (Medications Reviewed) Any new allergies since your discharge?: No Dietary orders reviewed?: Yes Do you have support at home?: Yes People in Home: child(ren), adult  Home Care and Equipment/Supplies: [place] Ordered?: NA Any new equipment or medical supplies ordered?: NA  Functional Questionnaire: Do you need assistance with bathing/showering or dressing?: No Do you need assistance with meal preparation?: No Do you need assistance with eating?: No Do you have difficulty maintaining continence: No Do you need assistance with getting out of bed/getting out of a chair/moving?: No Do you have difficulty managing or taking your medications?: No  Follow up appointments reviewed: PCP Follow-up appointment confirmed?: No (no avail appt times, sent message to staff to schedule) MD Provider Line Number:[phone] Given: No [facility] Follow-up appointment confirmed?: NA Do you need transportation to your follow-up appointment?: No Do you understand care options if your condition(s) worsen?: Yes-patient verbalized understanding    [place], [place] Nurse Health Advisor Direct Dial [phone]

## 2023-01-19 LAB — CULTURE, BLOOD (ROUTINE X 2)
Culture: NO GROWTH
Culture: NO GROWTH

## 2023-01-21 ENCOUNTER — Ambulatory Visit (HOSPITAL_COMMUNITY)
Admission: RE | Admit: 2023-01-21 | Discharge: 2023-01-21 | Disposition: A | Discharge status: Home / Self Care | Payer: 59 | Source: Ambulatory Visit | Attending: Internal Medicine | Admitting: Internal Medicine

## 2023-01-21 DIAGNOSIS — E079 Disorder of thyroid, unspecified: Secondary | ICD-10-CM | POA: Diagnosis not present

## 2023-02-05 ENCOUNTER — Other Ambulatory Visit: Payer: Self-pay

## 2023-02-05 ENCOUNTER — Ambulatory Visit (INDEPENDENT_AMBULATORY_CARE_PROVIDER_SITE_OTHER): Payer: No Typology Code available for payment source | Admitting: Internal Medicine

## 2023-02-05 ENCOUNTER — Encounter: Payer: Self-pay | Admitting: Internal Medicine

## 2023-02-05 VITALS — BP 135/69 | HR 85 | Temp 97.7°F | Ht 70.0 in | Wt 275.6 lb

## 2023-02-05 DIAGNOSIS — R7303 Prediabetes: Secondary | ICD-10-CM

## 2023-02-05 DIAGNOSIS — E079 Disorder of thyroid, unspecified: Secondary | ICD-10-CM

## 2023-02-05 DIAGNOSIS — J9601 Acute respiratory failure with hypoxia: Secondary | ICD-10-CM

## 2023-02-05 DIAGNOSIS — Z0000 Encounter for general adult medical examination without abnormal findings: Secondary | ICD-10-CM

## 2023-02-05 DIAGNOSIS — E669 Obesity, unspecified: Secondary | ICD-10-CM

## 2023-02-05 DIAGNOSIS — R5383 Other fatigue: Secondary | ICD-10-CM

## 2023-02-05 DIAGNOSIS — J101 Influenza due to other identified influenza virus with other respiratory manifestations: Secondary | ICD-10-CM

## 2023-02-05 DIAGNOSIS — Z6838 Body mass index (BMI) 38.0-38.9, adult: Secondary | ICD-10-CM

## 2023-02-05 NOTE — Patient Instructions (Addendum)
Dear Mrs. [name],  Thank you for trusting [place] with your care.   We discussed your fatigue/muscle aches and thyroid.   There are many things that can cause the fatigue and muscle aches including depression, vitamin D deficiency, and poor sleep. I recommend that you continue taking the sertraline. Make sure you get the sleep study done. Please focus on good sleep hygiene. We will check your thyroid, Vit D level, A1c.  We will also check a cholesterol level on you.   I have placed the referral for the thyroid biopsy to be done.   Please return in 3 months for a follow up. Please also follow up with Dr. Celine Mans.  Call the ENT doctor to reschedule your appointment.

## 2023-02-05 NOTE — Progress Notes (Unsigned)
CC: HFU  HPI:[name] is a 47 y.o. female who presents for evaluation of HFU. Please see individual problem based A/P for details.  46 yof hx  chronic asthma and irritable larynx syndrome, morbid obesity [name], protonix, advair HFA  Acute respiratory failure with hypoxia (HCC) Seen for hospital follow upafter being treated for acute respiratory failure secondary to flu. While in hospital, she got Tamiflu, nebs, mucinex, and supplemental O2. She was discharged after 2 nights. Since discharge, she has been feeling weak still. Complains of headache, myalgias. No fever. She also complains of cough, but this is a chronic issue for her that is being investigated by PCCM/ENT as well. Her cough sounds like it is back to baseline. Overall, I think her respiratory failure has resolved with some lingering symptoms which may or may not be related to viral illness.   Influenza A Resolved.  Other fatigue Patient complains of generalized fatigue, weakness, headache, myalgias, and diffuse joint pain of knees, elbows, shoulder, hands, ankles. She was hospitalized for AHRF 2/2 flu roughly 3 weeks ago, but  I do not think this is still the issue. She has risk factors for multiple potential underlying etiologies. Her fatigue is likely multifactorial, however, I think there are several predominant contributors - depression, nature of her job, and poor sleep. Her PHQ9 is 12. She has not been consistent with her sertraline. Patient believes that she is more depressed because of the way that she feels, though the inverse is certainly also possible and it would be difficult to determine which is which. 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 This may be a contributing factor to her joint pain. She also works during the night and suffers very poor sleep hygiene because of this.  We will also investigate other possibilities such as Vit D deficiency,  thyroid dysfunction, and insulin resistance.  - encouraged sertraline adherence - she will follow up with sleep study - encouraged good sleep hygiene - TSH - Vit D level - A1c  Prediabetes A1c 5.9.   Thyroid mass Patient had [place] of thyroid done. Nodule meets sonographic criteria for FNA. Discussed findings with patient. She voices understanding. - order placed for FNA  Healthcare maintenance Screened for DM and HLD. She is prediabetic. No indication to start lipid lowering therapy at this time.    Depression, PHQ-9: Based on the patients  Flowsheet Row Office Visit from 02/05/2023 in [facility]  PHQ-9 Total Score 12      score we have .  Past Medical History:  Diagnosis Date   Abdominal pain    Anemia    Asthma    Depression    Diarrhea    Flexor tenosynovitis of finger 12/03/2022   Heart murmur    Hypertension    Obesity    Plantar fasciitis    Shortness of breath    Trichomonal vaginitis 07/23/2022   Vomiting    Review of Systems:   See hpi  Physical Exam: Vitals:   02/05/23 1449  BP: 135/69  Pulse: 85  Temp: 97.7 F (36.5 C)  TempSrc: Oral  SpO2: 98%  Weight: 275 lb 9.6 oz (125 kg)  Height: 5\' 10"  (1.778 m)   General: NAD, obese HEENT: Conjunctiva nl , antiicteric sclerae, moist mucous membranes, no exudate or erythema Cardiovascular: Normal rate, regular rhythm.  No murmurs, rubs, or gallops Pulmonary : Equal breath sounds, No wheezes, rales, or rhonchi Abdominal:  soft, nontender,  bowel sounds present Ext: No edema in lower extremities, no tenderness to palpation of lower extremities.   Assessment & Plan:   See Encounters Tab for problem based charting.  Patient discussed with Dr. Cleda Daub

## 2023-02-06 LAB — HEMOGLOBIN A1C
Est. average glucose Bld gHb Est-mCnc: 123 mg/dL
Hgb A1c MFr Bld: 5.9 % — ABNORMAL HIGH (ref 4.8–5.6)

## 2023-02-06 LAB — TSH: TSH: 1.28 u[IU]/mL (ref 0.450–4.500)

## 2023-02-06 LAB — LIPID PANEL
Chol/HDL Ratio: 3 ratio (ref 0.0–4.4)
Cholesterol, Total: 199 mg/dL (ref 100–199)
HDL: 66 mg/dL (ref >39)
LDL Chol Calc (NIH): 116 mg/dL — ABNORMAL HIGH (ref 0–99)
Triglycerides: 98 mg/dL (ref 0–149)
VLDL Cholesterol Cal: 17 mg/dL (ref 5–40)

## 2023-02-06 LAB — VITAMIN D 25 HYDROXY (VIT D DEFICIENCY, FRACTURES): Vit D, 25-Hydroxy: 20.7 ng/mL — ABNORMAL LOW (ref 30.0–100.0)

## 2023-02-07 ENCOUNTER — Encounter: Payer: Self-pay | Admitting: Internal Medicine

## 2023-02-07 DIAGNOSIS — R7303 Prediabetes: Secondary | ICD-10-CM | POA: Insufficient documentation

## 2023-02-07 NOTE — Assessment & Plan Note (Addendum)
Patient complains of generalized fatigue, weakness, headache, myalgias, and diffuse joint pain of knees, elbows, shoulder, hands, ankles. She was hospitalized for AHRF 2/2 flu roughly 3 weeks ago, but  I do not think this is still the issue. She has risk factors for multiple potential underlying etiologies. Her fatigue is likely multifactorial, however, I think there are several predominant contributors - depression, nature of her job, and poor sleep. Her PHQ9 is 12. She has not been consistent with her sertraline. Patient believes that she is more depressed because of the way that she feels, though the inverse is certainly also possible and it would be difficult to determine which is which. 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 This may be a contributing factor to her joint pain. She also works during the night and suffers very poor sleep hygiene because of this.  We will also investigate other possibilities such as Vit D deficiency, thyroid dysfunction, and insulin resistance.  - encouraged sertraline adherence - she will follow up with sleep study - encouraged good sleep hygiene - TSH - Vit D level - A1c

## 2023-02-07 NOTE — Assessment & Plan Note (Signed)
A1c 5.9. 

## 2023-02-07 NOTE — Assessment & Plan Note (Signed)
Resolved

## 2023-02-07 NOTE — Assessment & Plan Note (Signed)
Patient had [place] of thyroid done. Nodule meets sonographic criteria for FNA. Discussed findings with patient. She voices understanding. - order placed for FNA

## 2023-02-07 NOTE — Progress Notes (Signed)
A1c in prediabetic range. She is obese and can consider starting metformin, but will defer this decision to future visit.  Lipid panel largely normal. ASCVD risk is low and does not require statin at this time. Would screen both A1c at least once per year. Lipid panel likely does not need to be repeated for at least another year unless other indication arises such as increase in A1c.   TSH is wnl. She does have low Vit D which can be contributing factor to her fatigue/mood. Will supplement.

## 2023-02-07 NOTE — Assessment & Plan Note (Signed)
Seen for hospital follow upafter being treated for acute respiratory failure secondary to flu. While in hospital, she got Tamiflu, nebs, mucinex, and supplemental O2. She was discharged after 2 nights. Since discharge, she has been feeling weak still. Complains of headache, myalgias. No fever. She also complains of cough, but this is a chronic issue for her that is being investigated by PCCM/ENT as well. Her cough sounds like it is back to baseline. Overall, I think her respiratory failure has resolved with some lingering symptoms which may or may not be related to viral illness.

## 2023-02-07 NOTE — Assessment & Plan Note (Signed)
Screened for DM and HLD. She is prediabetic. No indication to start lipid lowering therapy at this time.

## 2023-02-08 ENCOUNTER — Other Ambulatory Visit: Payer: Self-pay | Admitting: Internal Medicine

## 2023-02-08 MED ORDER — VITAMIN D3 10 MCG (400 UNIT) PO TABS: ORAL_TABLET | Freq: Every day | ORAL | 0 refills | Status: DC

## 2023-02-08 NOTE — Progress Notes (Signed)
Internal Medicine Clinic Attending  Case discussed with the resident at the time of the visit.  We reviewed the resident's history and exam and pertinent patient test results.  I agree with the assessment, diagnosis, and plan of care documented in the resident's note.  

## 2023-02-19 ENCOUNTER — Ambulatory Visit (HOSPITAL_COMMUNITY)
Admission: RE | Admit: 2023-02-19 | Discharge: 2023-02-19 | Disposition: A | Discharge status: Home / Self Care | Payer: No Typology Code available for payment source | Source: Ambulatory Visit | Attending: Internal Medicine | Admitting: Internal Medicine

## 2023-02-19 DIAGNOSIS — E041 Nontoxic single thyroid nodule: Secondary | ICD-10-CM | POA: Insufficient documentation

## 2023-02-19 DIAGNOSIS — E079 Disorder of thyroid, unspecified: Secondary | ICD-10-CM

## 2023-02-19 MED ORDER — LIDOCAINE HCL (PF) 1 % IJ SOLN
10.0000 mL | Freq: Once | INTRAMUSCULAR | Status: AC
  Administered 2023-02-19: 10 mL via INTRADERMAL

## 2023-02-20 LAB — CYTOLOGY - NON PAP

## 2023-02-22 NOTE — Progress Notes (Signed)
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 Low risk of malignancy and TSH has been normal so no indication for further management at this time. Can continue to monitor periodically. Discussed with patient and she understands.

## 2023-02-28 ENCOUNTER — Telehealth: Payer: No Typology Code available for payment source | Admitting: Physician Assistant

## 2023-02-28 DIAGNOSIS — J208 Acute bronchitis due to other specified organisms: Secondary | ICD-10-CM | POA: Diagnosis not present

## 2023-02-28 DIAGNOSIS — B9689 Other specified bacterial agents as the cause of diseases classified elsewhere: Secondary | ICD-10-CM

## 2023-02-28 MED ORDER — PREDNISONE 20 MG PO TABS: 40.0000 mg | ORAL_TABLET | Freq: Every day | ORAL | 0 refills | Status: DC

## 2023-02-28 MED ORDER — DOXYCYCLINE HYCLATE 100 MG PO TABS: 100.0000 mg | ORAL_TABLET | Freq: Two times a day (BID) | ORAL | 0 refills | Status: DC

## 2023-02-28 MED ORDER — PROMETHAZINE-DM 6.25-15 MG/5ML PO SYRP: 5.0000 mL | ORAL_SOLUTION | Freq: Four times a day (QID) | ORAL | 0 refills | Status: DC | PRN

## 2023-02-28 NOTE — Progress Notes (Signed)
Virtual Visit Consent   [name], you are scheduled for a virtual visit with a [facility] provider today. Just as with appointments in the office, your consent must be obtained to participate. Your consent will be active for this visit and any virtual visit you may have with one of our providers in the next 365 days. If you have a MyChart account, a copy of this consent can be sent to you electronically.  As this is a virtual visit, video technology does not allow for your provider to perform a traditional examination. This may limit your provider's ability to fully assess your condition. If your provider identifies any concerns that need to be evaluated in person or the need to arrange testing (such as labs, EKG, etc.), we will make arrangements to do so. Although advances in technology are sophisticated, we cannot ensure that it will always work on either your end or our end. If the connection with a video visit is poor, the visit may have to be switched to a telephone visit. With either a video or telephone visit, we are not always able to ensure that we have a secure connection.  By engaging in this virtual visit, you consent to the provision of healthcare and authorize for your insurance to be billed (if applicable) for the services provided during this visit. Depending on your insurance coverage, you may receive a charge related to this service.  I need to obtain your verbal consent now. Are you willing to proceed with your visit today? [name] has provided verbal consent on 02/28/2023 for a virtual visit (video or telephone). [name], [place]  Date: 02/28/2023 1:46 PM  Virtual Visit via Video Note   I, [name], connected with  [name]  ([id number], Jun 15, 1976) on 02/28/23 at  1:45 PM EDT by a video-enabled telemedicine application and verified that I am speaking with the correct person using two identifiers.  Location: Patient: Virtual  Visit Location Patient: Home Provider: Virtual Visit Location Provider: Home Office   I discussed the limitations of evaluation and management by telemedicine and the availability of in person appointments. The patient expressed understanding and agreed to proceed.    History of Present Illness: [name] is a 47 y.o. who identifies as a female who was assigned female at birth, and is being seen today for URI symptoms for the past 3 weeks. Notes chest congestion and cough that is productive of thick yellow phlegm. Denies fever, chills, aches. Some SOB worse with exertion. Denies recent travel or sick contact.  OTC Mucinex but not helping.   HPI: HPI  Problems:  Patient Active Problem List   Diagnosis Date Noted   Prediabetes 02/07/2023   Influenza A 01/15/2023   Irritable larynx syndrome 01/15/2023   Acute respiratory failure with hypoxia (HCC) 01/14/2023   Thyroid mass 12/03/2022   Abnormal uterine bleeding (AUB) 07/26/2022   GERD without esophagitis 12/24/2021   Depression, unspecified 12/24/2021   Other fatigue 11/17/2021   Iron deficiency anemia due to chronic blood loss 10/05/2020   Dysmenorrhea 10/05/2020   Uterine leiomyoma 10/05/2020   Obesity (BMI 30-39.9) 10/05/2020   Gastroesophageal reflux disease 10/05/2020   Healthcare maintenance 05/09/2020   Family history of DVT 03/21/2020   Plantar fasciitis 12/28/2019   Chronic asthma 07/31/2013   Anemia 01/01/2012   Hypokalemia 01/01/2012    Allergies: No Known Allergies Medications:  Current Outpatient Medications:    doxycycline (VIBRA-TABS) 100 MG tablet, Take 1  tablet (100 mg total) by mouth 2 (two) times daily., Disp: 14 tablet, Rfl: 0   predniSONE (DELTASONE) 20 MG tablet, Take 2 tablets (40 mg total) by mouth daily with breakfast., Disp: 10 tablet, Rfl: 0   promethazine-dextromethorphan (PROMETHAZINE-DM) 6.25-15 MG/5ML syrup, Take 5 mLs by mouth 4 (four) times daily as needed for cough., Disp: 118 mL, Rfl:  0   acetaminophen (TYLENOL) 500 MG tablet, Take 500 mg by mouth as needed for moderate pain., Disp: , Rfl:    albuterol (VENTOLIN HFA) 108 (90 Base) MCG/ACT inhaler, Inhale 2 puffs into the lungs every 4 (four) hours as needed for wheezing or shortness of breath., Disp: 18 g, Rfl: 1   Cholecalciferol 20 MCG (800 UNIT) TABS, Take 1 tablet by mouth daily., Disp: 75 tablet, Rfl: 0   clonazePAM (KLONOPIN) 0.5 MG tablet, Take 1 tablet (0.5 mg total) by mouth 2 (two) times daily as needed for anxiety. (Patient taking differently: Take 0.5 mg by mouth as needed for anxiety (sleep).), Disp: 20 tablet, Rfl: 0   fluticasone-salmeterol (ADVAIR HFA) 230-21 MCG/ACT inhaler, Inhale 1 puff into the lungs 2 (two) times daily., Disp: 1 each, Rfl: 1   guaiFENesin (MUCINEX) 600 MG 12 hr tablet, Take 1 tablet (600 mg total) by mouth 2 (two) times daily., Disp: 30 tablet, Rfl: 0   pantoprazole (PROTONIX) 40 MG tablet, Take 1 tablet (40 mg total) by mouth 2 (two) times daily before a meal., Disp: 60 tablet, Rfl: 3   sertraline (ZOLOFT) 25 MG tablet, Take 1 tablet (25 mg total) by mouth daily., Disp: 30 tablet, Rfl: 1  Current Facility-Administered Medications:    ipratropium-albuterol (DUONEB) 0.5-2.5 (3) MG/3ML nebulizer solution 3 mL, 3 mL, Nebulization, Q4H PRN, Regalado, Belkys A, MD  Observations/Objective: Patient is well-developed, well-nourished in no acute distress.  Resting comfortably at home.  Head is normocephalic, atraumatic.  No labored breathing. Speech is clear and coherent with logical content.  Patient is alert and oriented at baseline.   Assessment and Plan: 1. Acute bacterial bronchitis - promethazine-dextromethorphan (PROMETHAZINE-DM) 6.25-15 MG/5ML syrup; Take 5 mLs by mouth 4 (four) times daily as needed for cough.  Dispense: 118 mL; Refill: 0 - predniSONE (DELTASONE) 20 MG tablet; Take 2 tablets (40 mg total) by mouth daily with breakfast.  Dispense: 10 tablet; Refill: 0 - doxycycline  (VIBRA-TABS) 100 MG tablet; Take 1 tablet (100 mg total) by mouth 2 (two) times daily.  Dispense: 14 tablet; Refill: 0  Rx Doxycycline.  Increase fluids.  Rest.  Saline nasal spray.  Probiotic.  Mucinex as directed.  Humidifier in bedroom. Prednisone and promethazine-DM per orders. Continue with daily as asthmatic medications.  Strict in-person follow-up precautions reviewed.   Follow Up Instructions: I discussed the assessment and treatment plan with the patient. The patient was provided an opportunity to ask questions and all were answered. The patient agreed with the plan and demonstrated an understanding of the instructions.  A copy of instructions were sent to the patient via MyChart unless otherwise noted below.   The patient was advised to call back or seek an in-person evaluation if the symptoms worsen or if the condition fails to improve as anticipated.  Time:  I spent 10 minutes with the patient via telehealth technology discussing the above problems/concerns.    Patricia Climes, PA-C

## 2023-02-28 NOTE — Patient Instructions (Signed)
[name], thank you for joining [name], PA-C for today's virtual visit.  While this provider is not your primary care provider (PCP), if your PCP is located in our provider database this encounter information will be shared with them immediately following your visit.   A [facility] MyChart account gives you access to today's visit and all your visits, tests, and labs performed at [facility] " click here if you don't have a [facility] MyChart account or go to [email or web address]  Consent: (Patient) [name] provided verbal consent for this virtual visit at the beginning of the encounter.  Current Medications:  Current Outpatient Medications:    acetaminophen (TYLENOL) 500 MG tablet, Take 500 mg by mouth as needed for moderate pain., Disp: , Rfl:    albuterol (VENTOLIN HFA) 108 (90 Base) MCG/ACT inhaler, Inhale 2 puffs into the lungs every 4 (four) hours as needed for wheezing or shortness of breath., Disp: 18 g, Rfl: 1   Cholecalciferol 20 MCG (800 UNIT) TABS, Take 1 tablet by mouth daily., Disp: 75 tablet, Rfl: 0   clonazePAM (KLONOPIN) 0.5 MG tablet, Take 1 tablet (0.5 mg total) by mouth 2 (two) times daily as needed for anxiety. (Patient taking differently: Take 0.5 mg by mouth as needed for anxiety (sleep).), Disp: 20 tablet, Rfl: 0   dextromethorphan-guaiFENesin (ROBITUSSIN-DM) 10-100 MG/5ML liquid, Take 5 mLs by mouth 2 (two) times daily as needed for cough., Disp: 100 mL, Rfl: 0   fluticasone-salmeterol (ADVAIR HFA) 230-21 MCG/ACT inhaler, Inhale 1 puff into the lungs 2 (two) times daily., Disp: 1 each, Rfl: 1   guaiFENesin (MUCINEX) 600 MG 12 hr tablet, Take 1 tablet (600 mg total) by mouth 2 (two) times daily., Disp: 30 tablet, Rfl: 0   pantoprazole (PROTONIX) 40 MG tablet, Take 1 tablet (40 mg total) by mouth 2 (two) times daily before a meal., Disp: 60 tablet, Rfl: 3   sertraline (ZOLOFT) 25 MG tablet, Take 1 tablet (25 mg total)  by mouth daily., Disp: 30 tablet, Rfl: 1  Current Facility-Administered Medications:    ipratropium-albuterol (DUONEB) 0.5-2.5 (3) MG/3ML nebulizer solution 3 mL, 3 mL, Nebulization, Q4H PRN, Regalado, Belkys A, MD   Medications ordered in this encounter:  No orders of the defined types were placed in this encounter.    *If you need refills on other medications prior to your next appointment, please contact your pharmacy*  Follow-Up: Call back or seek an in-person evaluation if the symptoms worsen or if the condition fails to improve as anticipated.  [facility] [phone]  Other Instructions Take antibiotic (Doxycycline) as directed.  Increase fluids.  Get plenty of rest. Use Mucinex for congestion. Continue your inhalers. Start the prednisone and cough syrup as directed. Take a daily probiotic (I recommend Align or Culturelle, but even Activia Yogurt may be beneficial).  A humidifier placed in the bedroom may offer some relief for a dry, scratchy throat of nasal irritation.  Read information below on acute bronchitis. Please call or return to clinic if symptoms are not improving.  Acute Bronchitis Bronchitis is when the airways that extend from the windpipe into the lungs get red, puffy, and painful (inflamed). Bronchitis often causes thick spit (mucus) to develop. This leads to a cough. A cough is the most common symptom of bronchitis. In acute bronchitis, the condition usually begins suddenly and goes away over time (usually in 2 weeks). Smoking, allergies, and asthma can make bronchitis worse. Repeated episodes of bronchitis  may cause more lung problems.  HOME CARE Rest. Drink enough fluids to keep your pee (urine) clear or pale yellow (unless you need to limit fluids as told by your doctor). Only take over-the-counter or prescription medicines as told by your doctor. Avoid smoking and secondhand smoke. These can make bronchitis worse. If you are a smoker, think about  using nicotine gum or skin patches. Quitting smoking will help your lungs heal faster. Reduce the chance of getting bronchitis again by: Washing your hands often. Avoiding people with cold symptoms. Trying not to touch your hands to your mouth, nose, or eyes. Follow up with your doctor as told.  GET HELP IF: Your symptoms do not improve after 1 week of treatment. Symptoms include: Cough. Fever. Coughing up thick spit. Body aches. Chest congestion. Chills. Shortness of breath. Sore throat.  GET HELP RIGHT AWAY IF:  You have an increased fever. You have chills. You have severe shortness of breath. You have bloody thick spit (sputum). You throw up (vomit) often. You lose too much body fluid (dehydration). You have a severe headache. You faint.  MAKE SURE YOU:  Understand these instructions. Will watch your condition. Will get help right away if you are not doing well or get worse. Document Released: 04/02/2008 Document Revised: 06/17/2013 Document Reviewed: 04/07/2013 [facility] Patient Information 2015 [place], [place]. This information is not intended to replace advice given to you by your health care provider. Make sure you discuss any questions you have with your health care provider.    If you have been instructed to have an in-person evaluation today at a local Urgent Care facility, please use the link below. It will take you to a list of all of our available [facility], including address, phone number and hours of operation. Please do not delay care.  [facility]  If you or a family member do not have a primary care provider, use the link below to schedule a visit and establish care. When you choose a [facility] primary care physician or advanced practice provider, you gain a long-term partner in health. Find a Primary Care Provider  Learn more about [facility]'s in-office and virtual care options: [facility] - Get Care Now

## 2023-04-02 ENCOUNTER — Encounter: Payer: Self-pay | Admitting: *Deleted

## 2023-07-12 ENCOUNTER — Ambulatory Visit (INDEPENDENT_AMBULATORY_CARE_PROVIDER_SITE_OTHER): Payer: No Typology Code available for payment source | Admitting: Internal Medicine

## 2023-07-12 ENCOUNTER — Other Ambulatory Visit: Payer: Self-pay

## 2023-07-12 ENCOUNTER — Encounter: Payer: Self-pay | Admitting: Internal Medicine

## 2023-07-12 VITALS — BP 127/76 | HR 90 | Temp 98.1°F | Ht 69.5 in | Wt 274.1 lb

## 2023-07-12 DIAGNOSIS — Z0000 Encounter for general adult medical examination without abnormal findings: Secondary | ICD-10-CM

## 2023-07-12 DIAGNOSIS — J329 Chronic sinusitis, unspecified: Secondary | ICD-10-CM

## 2023-07-12 DIAGNOSIS — E079 Disorder of thyroid, unspecified: Secondary | ICD-10-CM | POA: Diagnosis not present

## 2023-07-12 DIAGNOSIS — M79605 Pain in left leg: Secondary | ICD-10-CM

## 2023-07-12 DIAGNOSIS — J45909 Unspecified asthma, uncomplicated: Secondary | ICD-10-CM | POA: Diagnosis not present

## 2023-07-12 DIAGNOSIS — R053 Chronic cough: Secondary | ICD-10-CM | POA: Diagnosis not present

## 2023-07-12 DIAGNOSIS — F32A Depression, unspecified: Secondary | ICD-10-CM

## 2023-07-12 DIAGNOSIS — K219 Gastro-esophageal reflux disease without esophagitis: Secondary | ICD-10-CM | POA: Diagnosis not present

## 2023-07-12 DIAGNOSIS — Z23 Encounter for immunization: Secondary | ICD-10-CM

## 2023-07-12 DIAGNOSIS — M25562 Pain in left knee: Secondary | ICD-10-CM | POA: Diagnosis not present

## 2023-07-12 MED ORDER — PANTOPRAZOLE SODIUM 40 MG PO TBEC: 40.0000 mg | DELAYED_RELEASE_TABLET | Freq: Two times a day (BID) | ORAL | 3 refills | Status: DC

## 2023-07-12 MED ORDER — SERTRALINE HCL 25 MG PO TABS: 25.0000 mg | ORAL_TABLET | Freq: Every day | ORAL | 1 refills | Status: DC

## 2023-07-12 MED ORDER — FLUTICASONE PROPIONATE 50 MCG/ACT NA SUSP: 2.0000 | Freq: Every day | NASAL | 0 refills | Status: DC

## 2023-07-12 MED ORDER — FLUTICASONE-SALMETEROL 230-21 MCG/ACT IN AERO: 1.0000 | INHALATION_SPRAY | Freq: Two times a day (BID) | RESPIRATORY_TRACT | 1 refills | Status: DC

## 2023-07-12 MED ORDER — CETIRIZINE HCL 10 MG PO TABS: 10.0000 mg | ORAL_TABLET | Freq: Every day | ORAL | 2 refills | Status: DC

## 2023-07-12 NOTE — Progress Notes (Unsigned)
   CC: follow up   HPI:  [name] is a 47 y.o. with medical history of asthma, GERD, MDD presenting to [facility] for a follow up appointment.   Please see problem-based list for further details, assessments, and plans.  Past Medical History:  Diagnosis Date   Abdominal pain    Acute respiratory failure with hypoxia (HCC) 01/14/2023   Anemia    Asthma    Depression    Diarrhea    Family history of DVT 03/21/2020   Flexor tenosynovitis of finger 12/03/2022   Heart murmur    Hypertension    Hypokalemia 01/01/2012   Influenza A 01/15/2023   Irritable larynx syndrome 01/15/2023   Obesity    Plantar fasciitis    Plantar fasciitis 12/28/2019   Shortness of breath    Trichomonal vaginitis 07/23/2022   Uterine leiomyoma 10/05/2020   Vomiting     Current Outpatient Medications (Endocrine & Metabolic):    predniSONE (DELTASONE) 20 MG tablet, Take 2 tablets (40 mg total) by mouth daily with breakfast.     Current Outpatient Medications (Respiratory):    albuterol (VENTOLIN HFA) 108 (90 Base) MCG/ACT inhaler, Inhale 2 puffs into the lungs every 4 (four) hours as needed for wheezing or shortness of breath.   fluticasone-salmeterol (ADVAIR HFA) 230-21 MCG/ACT inhaler, Inhale 1 puff into the lungs 2 (two) times daily.   guaiFENesin (MUCINEX) 600 MG 12 hr tablet, Take 1 tablet (600 mg total) by mouth 2 (two) times daily.   promethazine-dextromethorphan (PROMETHAZINE-DM) 6.25-15 MG/5ML syrup, Take 5 mLs by mouth 4 (four) times daily as needed for cough.  Current Facility-Administered Medications (Respiratory):    ipratropium-albuterol (DUONEB) 0.5-2.5 (3) MG/3ML nebulizer solution 3 mL  Current Outpatient Medications (Analgesics):    acetaminophen (TYLENOL) 500 MG tablet, Take 500 mg by mouth as needed for moderate pain.     Current Outpatient Medications (Other):    Cholecalciferol 20 MCG (800 UNIT) TABS, Take 1 tablet by mouth daily.   clonazePAM (KLONOPIN) 0.5 MG  tablet, Take 1 tablet (0.5 mg total) by mouth 2 (two) times daily as needed for anxiety. (Patient taking differently: Take 0.5 mg by mouth as needed for anxiety (sleep).)   doxycycline (VIBRA-TABS) 100 MG tablet, Take 1 tablet (100 mg total) by mouth 2 (two) times daily.   pantoprazole (PROTONIX) 40 MG tablet, Take 1 tablet (40 mg total) by mouth 2 (two) times daily before a meal.   sertraline (ZOLOFT) 25 MG tablet, Take 1 tablet (25 mg total) by mouth daily.   Review of Systems:  Review of system negative unless stated in the problem list or HPI.    Physical Exam:  There were no vitals filed for this visit. Physical Exam General: NAD HENT: NCAT Lungs: CTAB, no wheeze, rhonchi or rales.  Cardiovascular: Normal heart sounds, no r/m/g, 2+ pulses in all extremities. No LE edema Abdomen: No TTP, normal bowel sounds MSK: No asymmetry or muscle atrophy.  Skin: no lesions noted on exposed skin Neuro: Alert and oriented x4. CN grossly intact Psych: Normal mood and normal affect   Assessment & Plan:   No problem-specific Assessment & Plan notes found for this encounter.   See Encounters Tab for problem based charting.  Patient Discussed with Dr. {NAMES:[id number]::"Guilloud","Hoffman","Mullen","Narendra","Vincent","Machen","Lau","Hatcher","Williams"} Gwenevere Abbot, MD Eligha Bridegroom. [facility] Internal Medicine Residency, PGY-3   Repeat colonoscopy in 2028   Cough: Had Covid significant symptoms from 08/05-08/14.  Still coughing.  Taking mucinex, tylenol

## 2023-07-12 NOTE — Patient Instructions (Addendum)
[name], it was a pleasure seeing you today! You endorsed feeling well today. Below are some of the things we talked about this visit. We look forward to seeing you in the follow up appointment!  Today we discussed: Your cough can be from multiple problems: to help it follow the directions below.  Please start using your inhaler called symbicort twice every day.  Start taking protonix twice every day.  I want you to start using flonase nasal spray every day and take an allergy medicine called zyrtec everyday.  Use sinuses rinses every day twice.   For your leg pain, we will try to get an ultrasound of your leg and x ray of your knee.     I have ordered the following labs today:  Lab Orders  No laboratory test(s) ordered today      Referrals ordered today:   Referral Orders  No referral(s) requested today     I have ordered the following medication/changed the following medications:   Stop the following medications: Medications Discontinued During This Encounter  Medication Reason   doxycycline (VIBRA-TABS) 100 MG tablet Patient has not taken in last 30 days   ipratropium-albuterol (DUONEB) 0.5-2.5 (3) MG/3ML nebulizer solution 3 mL    promethazine-dextromethorphan (PROMETHAZINE-DM) 6.25-15 MG/5ML syrup Patient has not taken in last 30 days   predniSONE (DELTASONE) 20 MG tablet Patient has not taken in last 30 days   clonazePAM (KLONOPIN) 0.5 MG tablet Patient has not taken in last 30 days   sertraline (ZOLOFT) 25 MG tablet Reorder   fluticasone-salmeterol (ADVAIR HFA) 230-21 MCG/ACT inhaler Reorder   pantoprazole (PROTONIX) 40 MG tablet Reorder     Start the following medications: Meds ordered this encounter  Medications   sertraline (ZOLOFT) 25 MG tablet    Sig: Take 1 tablet (25 mg total) by mouth daily.    Dispense:  30 tablet    Refill:  1   fluticasone-salmeterol (ADVAIR HFA) 230-21 MCG/ACT inhaler    Sig: Inhale 1 puff into the lungs 2 (two) times  daily.    Dispense:  1 each    Refill:  1   pantoprazole (PROTONIX) 40 MG tablet    Sig: Take 1 tablet (40 mg total) by mouth 2 (two) times daily before a meal.    Dispense:  60 tablet    Refill:  3   fluticasone (FLONASE) 50 MCG/ACT nasal spray    Sig: Place 2 sprays into both nostrils daily.    Dispense:  16 g    Refill:  0   cetirizine (ZYRTEC ALLERGY) 10 MG tablet    Sig: Take 1 tablet (10 mg total) by mouth daily.    Dispense:  30 tablet    Refill:  2     Follow-up: 1 month follow up   Please make sure to arrive 15 minutes prior to your next appointment. If you arrive late, you may be asked to reschedule.   We look forward to seeing you next time. Please call our clinic at [phone] if you have any questions or concerns. The best time to call is Monday-Friday from 9am-4pm, but there is someone available 24/7. If after hours or the weekend, call the main hospital number and ask for the Internal Medicine Resident On-Call. If you need medication refills, please notify your pharmacy one week in advance and they will send [place] a request.  Thank you for letting us take part in your care. Wishing you the best!  Thank you, [employer]  Welton Flakes, MD

## 2023-07-14 DIAGNOSIS — R053 Chronic cough: Secondary | ICD-10-CM | POA: Insufficient documentation

## 2023-07-14 DIAGNOSIS — M79605 Pain in left leg: Secondary | ICD-10-CM | POA: Insufficient documentation

## 2023-07-14 NOTE — Assessment & Plan Note (Signed)
Flu shot given this visit.

## 2023-07-14 NOTE — Assessment & Plan Note (Signed)
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

## 2023-07-14 NOTE — Assessment & Plan Note (Addendum)
Patient with complaint of left lower extremity pain that is present in her left knee and radiates to her calf.  She is unsure if the pain starts in her calf vs radiates to her calf.  Her calf is TTP and larger compared to right calf. Homans sign is positive. Will get stat doppler to rule out DVT. Will also get DG of left knee to look for OA.

## 2023-07-14 NOTE — Assessment & Plan Note (Addendum)
Patient with asthma that is uncontrolled.  She is only using albuterol and not her Advair.  Will restart patient's Advair and have her use albuterol as needed.  Patient has chronic cough which can be attributed to her asthma.

## 2023-07-14 NOTE — Assessment & Plan Note (Addendum)
Patient with chronic cough that has been present for around 2 months.  She had COVID in the beginning of August and states the cough has been present since then.  But per chart review, it appears this is a chronic issue for her.  She has multiple causes to have a chronic cough including uncontrolled asthma, GERD, postnasal drip.  Will try to optimize the above conditions with her inhalers, PPI, antihistamine/Flonase/Nettie pot respectively.  I believe this will improve her cough.  She could have some worsening over cough from COVID infection but overall has a chronic cough that predates COVID.

## 2023-07-14 NOTE — Assessment & Plan Note (Signed)
Patient with symptoms of GERD who is not currently taking any medications.  She has concern for chronic cough that is worse at night so we will treat with PPI trial.

## 2023-07-15 NOTE — Progress Notes (Signed)
Internal Medicine Clinic Attending  Case discussed with the resident physician at the time of the visit.  We reviewed the patient's history, exam, and pertinent patient test results.  I agree with the assessment, diagnosis, and plan of care documented in the resident's note.

## 2023-07-16 ENCOUNTER — Telehealth: Payer: No Typology Code available for payment source | Admitting: Physician Assistant

## 2023-07-16 DIAGNOSIS — J208 Acute bronchitis due to other specified organisms: Secondary | ICD-10-CM | POA: Diagnosis not present

## 2023-07-16 DIAGNOSIS — B9689 Other specified bacterial agents as the cause of diseases classified elsewhere: Secondary | ICD-10-CM | POA: Diagnosis not present

## 2023-07-16 MED ORDER — PREDNISONE 20 MG PO TABS: 40.0000 mg | ORAL_TABLET | Freq: Every day | ORAL | 0 refills | Status: DC

## 2023-07-16 MED ORDER — ONDANSETRON 4 MG PO TBDP: 4.0000 mg | ORAL_TABLET | Freq: Three times a day (TID) | ORAL | 0 refills | Status: DC | PRN

## 2023-07-16 MED ORDER — DOXYCYCLINE HYCLATE 100 MG PO TABS: 100.0000 mg | ORAL_TABLET | Freq: Two times a day (BID) | ORAL | 0 refills | Status: DC

## 2023-07-16 NOTE — Progress Notes (Signed)
Message sent to patient requesting further input regarding current symptoms. Awaiting patient response.  

## 2023-07-16 NOTE — Progress Notes (Signed)
I have spent 5 minutes in review of e-visit questionnaire, review and updating patient chart, medical decision making and response to patient.   Mia Milan Cody Jacklynn Dehaas, PA-C    

## 2023-07-16 NOTE — Progress Notes (Signed)
E-Visit for Cough  We are sorry that you are not feeling well.  Here is how we plan to help!  Based on your presentation I believe you most likely have A cough due to bacteria.  When patients have a fever and a productive cough with a change in color or increased sputum production, we are concerned about bacterial bronchitis.  If left untreated it can progress to pneumonia.  If your symptoms do not improve with your treatment plan it is important that you contact your provider.   I have prescribed Doxycycline 100 mg twice a day for 7 days     I have also prescribed a short course of prednisone and a medication (zofran) for nausea and vomiting.  From your responses in the eVisit questionnaire you describe inflammation in the upper respiratory tract which is causing a significant cough.  This is commonly called Bronchitis and has four common causes:   Allergies Viral Infections Acid Reflux Bacterial Infection Allergies, viruses and acid reflux are treated by controlling symptoms or eliminating the cause. An example might be a cough caused by taking certain blood pressure medications. You stop the cough by changing the medication. Another example might be a cough caused by acid reflux. Controlling the reflux helps control the cough.  USE OF BRONCHODILATOR ("RESCUE") INHALERS: There is a risk from using your bronchodilator too frequently.  The risk is that over-reliance on a medication which only relaxes the muscles surrounding the breathing tubes can reduce the effectiveness of medications prescribed to reduce swelling and congestion of the tubes themselves.  Although you feel brief relief from the bronchodilator inhaler, your asthma may actually be worsening with the tubes becoming more swollen and filled with mucus.  This can delay other crucial treatments, such as oral steroid medications. If you need to use a bronchodilator inhaler daily, several times per day, you should discuss this with your  provider.  There are probably better treatments that could be used to keep your asthma under control.     HOME CARE Only take medications as instructed by your medical team. Complete the entire course of an antibiotic. Drink plenty of fluids and get plenty of rest. Avoid close contacts especially the very young and the elderly Cover your mouth if you cough or cough into your sleeve. Always remember to wash your hands A steam or ultrasonic humidifier can help congestion.   GET HELP RIGHT AWAY IF: You develop worsening fever. You become short of breath You cough up blood. Your symptoms persist after you have completed your treatment plan MAKE SURE YOU  Understand these instructions. Will watch your condition. Will get help right away if you are not doing well or get worse.    Thank you for choosing an e-visit.  Your e-visit answers were reviewed by a board certified advanced clinical practitioner to complete your personal care plan. Depending upon the condition, your plan could have included both over the counter or prescription medications.  Please review your pharmacy choice. Make sure the pharmacy is open so you can pick up prescription now. If there is a problem, you may contact your provider through [employer] and have the prescription routed to another pharmacy.  Your safety is important to [place]. If you have drug allergies check your prescription carefully.   For the next 24 hours you can use MyChart to ask questions about today's visit, request a non-urgent call back, or ask for a work or school excuse. You will get an email  in the next two days asking about your experience. I hope that your e-visit has been valuable and will speed your recovery.

## 2023-07-30 ENCOUNTER — Ambulatory Visit (HOSPITAL_COMMUNITY)
Admission: RE | Admit: 2023-07-30 | Discharge: 2023-07-30 | Disposition: A | Discharge status: Home / Self Care | Payer: 59 | Source: Ambulatory Visit | Attending: Emergency Medicine | Admitting: Emergency Medicine

## 2023-07-30 ENCOUNTER — Encounter (HOSPITAL_COMMUNITY): Payer: Self-pay

## 2023-07-30 ENCOUNTER — Ambulatory Visit (INDEPENDENT_AMBULATORY_CARE_PROVIDER_SITE_OTHER): Payer: 59

## 2023-07-30 VITALS — BP 122/53 | HR 80 | Temp 97.5°F | Resp 16 | Ht 71.0 in | Wt 267.0 lb

## 2023-07-30 DIAGNOSIS — M25462 Effusion, left knee: Secondary | ICD-10-CM | POA: Diagnosis not present

## 2023-07-30 MED ORDER — KETOROLAC TROMETHAMINE 30 MG/ML IJ SOLN
INTRAMUSCULAR | Status: AC
  Filled 2023-07-30: qty 1

## 2023-07-30 MED ORDER — KETOROLAC TROMETHAMINE 30 MG/ML IJ SOLN
30.0000 mg | Freq: Once | INTRAMUSCULAR | Status: AC
  Administered 2023-07-30: 30 mg via INTRAMUSCULAR

## 2023-07-30 NOTE — Discharge Instructions (Addendum)
[facility] has [facility] urgent care hours from 5:30-9pm Monday- Friday. I recommend going there tonight when they open for an evaluation.   If the radiologist finds anything concerning on your x-ray, we will call you.

## 2023-07-30 NOTE — ED Provider Notes (Signed)
[facility]    CSN: [id number] Arrival date & time: 07/30/23  1406      History   Chief Complaint Chief Complaint  Patient presents with   Knee Pain    Entered by patient    HPI [name] is a 47 y.o. female.  Patient with left knee pain for the last 3 weeks.  Saw her PCP on 07/12/2023 and reported this pain.  X-ray ordered but no one called the patient to let her know to get it.  Patient recently completed a course of antibiotics and prednisone for a respiratory infection.  While she was taking these medicines, her knee pain improved significantly and felt like it was getting better.  When she completed this treatment.  The pain came back and is much worse.  Her knee hurts, her left lower leg is swollen, and is difficult for her to bear weight due to pain.  Denies fall or injury or event that triggered knee pain.  She has never had pain like this before.  Does not have a history of gout.   Knee Pain   Past Medical History:  Diagnosis Date   Abdominal pain    Acute respiratory failure with hypoxia (HCC) 01/14/2023   Anemia    Asthma    Depression    Diarrhea    Family history of DVT 03/21/2020   Flexor tenosynovitis of finger 12/03/2022   GERD without esophagitis 12/24/2021   Heart murmur    Hypertension    Hypokalemia 01/01/2012   Influenza A 01/15/2023   Irritable larynx syndrome 01/15/2023   Obesity    Plantar fasciitis    Plantar fasciitis 12/28/2019   Shortness of breath    Trichomonal vaginitis 07/23/2022   Uterine leiomyoma 10/05/2020   Vomiting     Patient Active Problem List   Diagnosis Date Noted   Lower extremity pain, left 07/14/2023   Chronic cough 07/14/2023   Prediabetes 02/07/2023   Thyroid mass 12/03/2022   Depression, unspecified 12/24/2021   Other fatigue 11/17/2021   Obesity (BMI 30-39.9) 10/05/2020   Gastroesophageal reflux disease 10/05/2020   Healthcare maintenance 05/09/2020   Chronic asthma 07/31/2013    Past  Surgical History:  Procedure Laterality Date   CHOLECYSTECTOMY  06/19/11   IR RADIOLOGY PERIPHERAL GUIDED IV START  10/23/2018   IR US GUIDE VASC ACCESS RIGHT  10/23/2018   TUBAL LIGATION      OB History     Gravida  5   Para  4   Term  3   Preterm  1   AB  1   Living  4      SAB  1   IAB      Ectopic      Multiple      Live Births  4            Home Medications    Prior to Admission medications   Medication Sig Start Date End Date Taking? Authorizing Provider  doxycycline (VIBRA-TABS) 100 MG tablet Take 1 tablet (100 mg total) by mouth 2 (two) times daily. 07/16/23  Yes Waldon Merl, PA-C  ondansetron (ZOFRAN-ODT) 4 MG disintegrating tablet Take 1 tablet (4 mg total) by mouth every 8 (eight) hours as needed for nausea or vomiting. 07/16/23  Yes Waldon Merl, PA-C  predniSONE (DELTASONE) 20 MG tablet Take 2 tablets (40 mg total) by mouth daily with breakfast. 07/16/23  Yes Waldon Merl, PA-C  acetaminophen (TYLENOL) 500 MG  tablet Take 500 mg by mouth as needed for moderate pain.    [provider]  albuterol (VENTOLIN HFA) 108 (90 Base) MCG/ACT inhaler Inhale 2 puffs into the lungs every 4 (four) hours as needed for wheezing or shortness of breath. 01/17/23   Regalado, Belkys A, MD  cetirizine (ZYRTEC ALLERGY) 10 MG tablet Take 1 tablet (10 mg total) by mouth daily. 07/12/23 10/10/23  Gwenevere Abbot, MD  Cholecalciferol 20 MCG (800 UNIT) TABS Take 1 tablet by mouth daily. 02/08/23   Adron Bene, MD  fluticasone (FLONASE) 50 MCG/ACT nasal spray Place 2 sprays into both nostrils daily. 07/12/23 07/11/24  Gwenevere Abbot, MD  fluticasone-salmeterol (ADVAIR HFA) [phone] MCG/ACT inhaler Inhale 1 puff into the lungs 2 (two) times daily. 07/12/23   Gwenevere Abbot, MD  guaiFENesin (MUCINEX) 600 MG 12 hr tablet Take 1 tablet (600 mg total) by mouth 2 (two) times daily. 01/17/23   Regalado, Belkys A, MD  pantoprazole (PROTONIX) 40 MG tablet Take 1 tablet (40 mg  total) by mouth 2 (two) times daily before a meal. 07/12/23   Gwenevere Abbot, MD  sertraline (ZOLOFT) 25 MG tablet Take 1 tablet (25 mg total) by mouth daily. 07/12/23   Gwenevere Abbot, MD    Family History Family History  Problem Relation Age of Onset   Diabetes Mother 28   Hypertension Mother    Other Mother        sepsis   Emphysema Father 94       smoked   Heart disease Father 88       MI   Emphysema Maternal Aunt        smoked   Esophageal cancer Maternal Aunt    Lung cancer Maternal Aunt        smoked   Heart disease Maternal Grandfather    Heart disease Maternal Aunt    Heart disease Sister 43       MI   Cancer Brother    Cancer Maternal Uncle    Cancer Maternal Grandmother    Heart disease Sister    Aneurysm Brother    Colon cancer Neg Hx    Colon polyps Neg Hx    Rectal cancer Neg Hx    Stomach cancer Neg Hx     Social History Social History   Tobacco Use   Smoking status: Never   Smokeless tobacco: Never  Vaping Use   Vaping status: Never Used  Substance Use Topics   Alcohol use: No   Drug use: No     Allergies   Patient has no known allergies.   Review of Systems Review of Systems   Physical Exam Triage Vital Signs ED Triage Vitals  Encounter Vitals Group     BP 07/30/23 1424 (!) 122/53     Systolic BP Percentile --      Diastolic BP Percentile --      Pulse Rate 07/30/23 1424 80     Resp 07/30/23 1424 16     Temp 07/30/23 1424 (!) 97.5 F (36.4 C)     Temp Source 07/30/23 1424 Oral     SpO2 07/30/23 1424 94 %     Weight 07/30/23 1424 267 lb (121.1 kg)     Height 07/30/23 1424 5\' 11"  (1.803 m)     Head Circumference --      Peak Flow --      Pain Score 07/30/23 1423 7     Pain Loc --      Pain  Education --      Exclude from [employer] Chart --    No data found.  Updated Vital Signs BP (!) 122/53 (BP Location: Left Arm)   Pulse 80   Temp (!) 97.5 F (36.4 C) (Oral)   Resp 16   Ht 5\' 11"  (1.803 m)   Wt 267 lb (121.1 kg)   LMP  06/26/2023 (Approximate)   SpO2 94%   BMI 37.24 kg/m   Visual Acuity Right Eye Distance:   Left Eye Distance:   Bilateral Distance:    Right Eye Near:   Left Eye Near:    Bilateral Near:     Physical Exam Constitutional:      Appearance: Normal appearance.  Pulmonary:     Effort: Pulmonary effort is normal.  Musculoskeletal:     Left knee: Swelling and bony tenderness present. No erythema. Tenderness present.     Left lower leg: Swelling and tenderness present.     Comments: Left knee with swelling and warmth.  No erythema.  Left lower leg is swollen and knee is swollen compared to right.  Neurological:     Mental Status: She is alert.      UC Treatments / Results  Labs (all labs ordered are listed, but only abnormal results are displayed) Labs Reviewed - No data to display  EKG   Radiology DG Knee Complete 4 Views Left  Result Date: 07/30/2023 CLINICAL DATA:  Worsening pain and swelling of left knee for 3 weeks. EXAM: LEFT KNEE - COMPLETE 4+ VIEW COMPARISON:  None Available. FINDINGS: No evidence of fracture or dislocation. Minor peripheral spurring in the medial tibiofemoral compartment. No erosion, periostitis or focal bone abnormality. Trace knee joint effusion. Soft tissues are unremarkable. IMPRESSION: Minor degenerative spurring in the medial tibiofemoral compartment. Trace knee joint effusion. Electronically Signed   By: Narda Rutherford M.D.   On: 07/30/2023 16:00    Procedures Procedures (including critical care time)  Medications Ordered in UC Medications  ketorolac (TORADOL) 30 MG/ML injection 30 mg (30 mg Intramuscular Given 07/30/23 1614)    Initial Impression / Assessment and Plan / UC Course  I have reviewed the triage vital signs and the nursing notes.  Pertinent labs & imaging results that were available during my care of the patient were reviewed by me and considered in my medical decision making (see chart for details).    Patient with  significant pain to entire knee area (lateral, medial, anterior, posterior, inferior, superior) and to left calf with light palpation.  X-ray does show a small effusion but I do not see that this accounts for her pain.  Patient given crutches as she has too much pain to bear weight.  Referred to orthopedic after Hours urgent care for further evaluation.  I do not suspect osteomyelitis.  Gout is a possibility  Final Clinical Impressions(s) / UC Diagnoses   Final diagnoses:  Effusion of left knee     Discharge Instructions      [facility] urgent care hours from 5:30-9pm Monday- Friday. I recommend going there tonight when they open for an evaluation.   If the radiologist finds anything concerning on your x-ray, we will call you.    ED Prescriptions   None    PDMP not reviewed this encounter.   Cathlyn Parsons, NP 07/30/23 [phone]

## 2023-07-30 NOTE — ED Triage Notes (Signed)
Patient here today with c/o left knee pain X 3 weeks. Patient has seen her PCP and has been taking prednisone and doxycycline for a respiratory infection which seemed to help with her knee pain. Once completing those medications, her knee pain came back. No known injury.

## 2023-08-22 ENCOUNTER — Ambulatory Visit (HOSPITAL_COMMUNITY)
Admission: RE | Admit: 2023-08-22 | Discharge: 2023-08-22 | Disposition: A | Discharge status: Home / Self Care | Payer: 59 | Source: Ambulatory Visit | Attending: Physician Assistant | Admitting: Physician Assistant

## 2023-08-22 ENCOUNTER — Encounter (HOSPITAL_COMMUNITY): Payer: Self-pay

## 2023-08-22 ENCOUNTER — Telehealth: Payer: 59 | Admitting: Family Medicine

## 2023-08-22 ENCOUNTER — Ambulatory Visit (INDEPENDENT_AMBULATORY_CARE_PROVIDER_SITE_OTHER): Payer: 59

## 2023-08-22 VITALS — BP 121/77 | HR 83 | Temp 98.6°F | Resp 16

## 2023-08-22 DIAGNOSIS — J4541 Moderate persistent asthma with (acute) exacerbation: Secondary | ICD-10-CM | POA: Diagnosis not present

## 2023-08-22 DIAGNOSIS — R051 Acute cough: Secondary | ICD-10-CM | POA: Diagnosis not present

## 2023-08-22 DIAGNOSIS — R059 Cough, unspecified: Secondary | ICD-10-CM | POA: Diagnosis not present

## 2023-08-22 DIAGNOSIS — R0602 Shortness of breath: Secondary | ICD-10-CM

## 2023-08-22 DIAGNOSIS — R058 Other specified cough: Secondary | ICD-10-CM

## 2023-08-22 MED ORDER — METHYLPREDNISOLONE SODIUM SUCC 125 MG IJ SOLR
80.0000 mg | Freq: Once | INTRAMUSCULAR | Status: AC
  Administered 2023-08-22: 80 mg via INTRAMUSCULAR

## 2023-08-22 MED ORDER — PREDNISONE 10 MG (21) PO TBPK: ORAL_TABLET | Freq: Every day | ORAL | 0 refills | Status: DC

## 2023-08-22 MED ORDER — IPRATROPIUM-ALBUTEROL 0.5-2.5 (3) MG/3ML IN SOLN
RESPIRATORY_TRACT | Status: AC
Start: 2023-08-22 — End: ?
  Filled 2023-08-22: qty 3

## 2023-08-22 MED ORDER — IPRATROPIUM-ALBUTEROL 0.5-2.5 (3) MG/3ML IN SOLN
3.0000 mL | Freq: Once | RESPIRATORY_TRACT | Status: AC
  Administered 2023-08-22: 3 mL via RESPIRATORY_TRACT

## 2023-08-22 MED ORDER — ALBUTEROL SULFATE (2.5 MG/3ML) 0.083% IN NEBU: 2.5000 mg | INHALATION_SOLUTION | Freq: Four times a day (QID) | RESPIRATORY_TRACT | 0 refills | Status: DC | PRN

## 2023-08-22 MED ORDER — METHYLPREDNISOLONE SODIUM SUCC 125 MG IJ SOLR
INTRAMUSCULAR | Status: AC
Start: 2023-08-22 — End: ?
  Filled 2023-08-22: qty 2

## 2023-08-22 NOTE — ED Provider Notes (Signed)
[facility]    CSN: [id number] Arrival date & time: 08/22/23  1347      History   Chief Complaint Chief Complaint  Patient presents with   Cough    Persistent cough , knots that are painful to touch behind right ear - Entered by patient    HPI [name] is a 47 y.o. female.   Patient presents today with a 2-week history of recurrent cough and congestion.  She initially got sick on 07/16/2023 at which point she participated in an E-visit she was prescribed prednisone and doxycycline.  She reports symptoms initially improved but then recurred and have been persistent in more extreme.  She has been taking Mucinex without improvement of symptoms.  Denies any known sick contacts.  She has had COVID with last episode within the past 90 days (around July 2024).  She is up-to-date on COVID-19 vaccines.  She denies additional antibiotics or steroids in the past 9 days.  She does have a history of asthma and has required hospitalization with last episode approximately 1 year ago.  She does take Advair daily and has been compliant with this medication.  She has been using her albuterol more frequently without improvement of symptoms.  She has no concern for pregnancy.    Past Medical History:  Diagnosis Date   Abdominal pain    Acute respiratory failure with hypoxia (HCC) 01/14/2023   Anemia    Asthma    Depression    Diarrhea    Family history of DVT 03/21/2020   Flexor tenosynovitis of finger 12/03/2022   GERD without esophagitis 12/24/2021   Heart murmur    Hypertension    Hypokalemia 01/01/2012   Influenza A 01/15/2023   Irritable larynx syndrome 01/15/2023   Obesity    Plantar fasciitis    Plantar fasciitis 12/28/2019   Shortness of breath    Trichomonal vaginitis 07/23/2022   Uterine leiomyoma 10/05/2020   Vomiting     Patient Active Problem List   Diagnosis Date Noted   Lower extremity pain, left 07/14/2023   Chronic cough 07/14/2023   Prediabetes  02/07/2023   Thyroid mass 12/03/2022   Depression, unspecified 12/24/2021   Other fatigue 11/17/2021   Obesity (BMI 30-39.9) 10/05/2020   Gastroesophageal reflux disease 10/05/2020   Healthcare maintenance 05/09/2020   Chronic asthma 07/31/2013    Past Surgical History:  Procedure Laterality Date   CHOLECYSTECTOMY  06/19/11   IR RADIOLOGY PERIPHERAL GUIDED IV START  10/23/2018   IR US GUIDE VASC ACCESS RIGHT  10/23/2018   TUBAL LIGATION      OB History     Gravida  5   Para  4   Term  3   Preterm  1   AB  1   Living  4      SAB  1   IAB      Ectopic      Multiple      Live Births  4            Home Medications    Prior to Admission medications   Medication Sig Start Date End Date Taking? Authorizing Provider  albuterol (PROVENTIL) (2.5 MG/3ML) 0.083% nebulizer solution Take 3 mLs (2.5 mg total) by nebulization every 6 (six) hours as needed for wheezing or shortness of breath. 08/22/23  Yes Alaynna Kerwood K, PA-C  albuterol (VENTOLIN HFA) 108 (90 Base) MCG/ACT inhaler Inhale 2 puffs into the lungs every 4 (four) hours as needed for  wheezing or shortness of breath. 01/17/23  Yes Regalado, Belkys A, MD  cetirizine (ZYRTEC ALLERGY) 10 MG tablet Take 1 tablet (10 mg total) by mouth daily. 07/12/23 10/10/23 Yes Gwenevere Abbot, MD  Cholecalciferol 20 MCG (800 UNIT) TABS Take 1 tablet by mouth daily. 02/08/23  Yes Adron Bene, MD  fluticasone (FLONASE) 50 MCG/ACT nasal spray Place 2 sprays into both nostrils daily. 07/12/23 07/11/24 Yes Gwenevere Abbot, MD  fluticasone-salmeterol (ADVAIR HFA) [phone] MCG/ACT inhaler Inhale 1 puff into the lungs 2 (two) times daily. 07/12/23  Yes Gwenevere Abbot, MD  guaiFENesin (MUCINEX) 600 MG 12 hr tablet Take 1 tablet (600 mg total) by mouth 2 (two) times daily. 01/17/23  Yes Regalado, Belkys A, MD  pantoprazole (PROTONIX) 40 MG tablet Take 1 tablet (40 mg total) by mouth 2 (two) times daily before a meal. 07/12/23  Yes Gwenevere Abbot, MD   predniSONE (STERAPRED UNI-PAK 21 TAB) 10 MG (21) TBPK tablet Take by mouth daily. Take 6 tabs by mouth daily  for 2 days, then 5 tabs for 2 days, then 4 tabs for 2 days, then 3 tabs for 2 days, 2 tabs for 2 days, then 1 tab by mouth daily for 2 days 08/22/23  Yes Alek Poncedeleon K, PA-C  sertraline (ZOLOFT) 25 MG tablet Take 1 tablet (25 mg total) by mouth daily. 07/12/23  Yes Gwenevere Abbot, MD  acetaminophen (TYLENOL) 500 MG tablet Take 500 mg by mouth as needed for moderate pain.    [provider]  ondansetron (ZOFRAN-ODT) 4 MG disintegrating tablet Take 1 tablet (4 mg total) by mouth every 8 (eight) hours as needed for nausea or vomiting. 07/16/23   Waldon Merl, PA-C    Family History Family History  Problem Relation Age of Onset   Diabetes Mother 41   Hypertension Mother    Other Mother        sepsis   Emphysema Father 76       smoked   Heart disease Father 13       MI   Emphysema Maternal Aunt        smoked   Esophageal cancer Maternal Aunt    Lung cancer Maternal Aunt        smoked   Heart disease Maternal Grandfather    Heart disease Maternal Aunt    Heart disease Sister 19       MI   Cancer Brother    Cancer Maternal Uncle    Cancer Maternal Grandmother    Heart disease Sister    Aneurysm Brother    Colon cancer Neg Hx    Colon polyps Neg Hx    Rectal cancer Neg Hx    Stomach cancer Neg Hx     Social History Social History   Tobacco Use   Smoking status: Never   Smokeless tobacco: Never  Vaping Use   Vaping status: Never Used  Substance Use Topics   Alcohol use: No   Drug use: No     Allergies   Patient has no known allergies.   Review of Systems Review of Systems  Constitutional:  Positive for activity change. Negative for appetite change, fatigue and fever.  HENT:  Positive for congestion. Negative for sinus pressure, sneezing and sore throat.   Respiratory:  Positive for cough, chest tightness, shortness of breath and wheezing.    Cardiovascular:  Negative for chest pain.  Gastrointestinal:  Positive for nausea. Negative for abdominal pain, diarrhea and vomiting.  Musculoskeletal:  Negative for  arthralgias and myalgias.  Neurological:  Negative for dizziness, light-headedness and headaches.     Physical Exam Triage Vital Signs ED Triage Vitals [08/22/23 1411]  Encounter Vitals Group     BP 121/77     Systolic BP Percentile      Diastolic BP Percentile      Pulse Rate 83     Resp 16     Temp 98.6 F (37 C)     Temp Source Oral     SpO2 94 %     Weight      Height      Head Circumference      Peak Flow      Pain Score 0     Pain Loc      Pain Education      Exclude from Growth Chart    No data found.  Updated Vital Signs BP 121/77 (BP Location: Left Arm)   Pulse 83   Temp 98.6 F (37 C) (Oral)   Resp 16   LMP 06/26/2023 (Approximate)   SpO2 94%   Visual Acuity Right Eye Distance:   Left Eye Distance:   Bilateral Distance:    Right Eye Near:   Left Eye Near:    Bilateral Near:     Physical Exam Vitals reviewed.  Constitutional:      General: She is awake. She is not in acute distress.    Appearance: Normal appearance. She is well-developed. She is not ill-appearing.     Comments: Very pleasant female appears stated age in no acute distress sitting comfortably in exam room  HENT:     Head: Normocephalic and atraumatic.     Right Ear: Tympanic membrane, ear canal and external ear normal. Tympanic membrane is not erythematous or bulging.     Left Ear: Tympanic membrane, ear canal and external ear normal. Tympanic membrane is not erythematous or bulging.     Nose:     Right Sinus: Maxillary sinus tenderness present. No frontal sinus tenderness.     Left Sinus: Maxillary sinus tenderness and frontal sinus tenderness present.     Mouth/Throat:     Pharynx: Uvula midline. No oropharyngeal exudate or posterior oropharyngeal erythema.  Cardiovascular:     Rate and Rhythm: Normal rate and  regular rhythm.     Heart sounds: Normal heart sounds, S1 normal and S2 normal. No murmur heard. Pulmonary:     Effort: Pulmonary effort is normal.     Breath sounds: Wheezing present. No rhonchi or rales.     Comments: Widespread wheezing Psychiatric:        Behavior: Behavior is cooperative.      UC Treatments / Results  Labs (all labs ordered are listed, but only abnormal results are displayed) Labs Reviewed - No data to display  EKG   Radiology No results found.  Procedures Procedures (including critical care time)  Medications Ordered in UC Medications  ipratropium-albuterol (DUONEB) 0.5-2.5 (3) MG/3ML nebulizer solution 3 mL (3 mLs Nebulization Given 08/22/23 1433)  methylPREDNISolone sodium succinate (SOLU-MEDROL) 125 mg/2 mL injection 80 mg (80 mg Intramuscular Given 08/22/23 1433)    Initial Impression / Assessment and Plan / UC Course  I have reviewed the triage vital signs and the nursing notes.  Pertinent labs & imaging results that were available during my care of the patient were reviewed by me and considered in my medical decision making (see chart for details).     Patient is well-appearing, afebrile, nontoxic, nontachycardic.  She  was given Solu-Medrol and DuoNeb in clinic with significant improvement of symptoms.  Suspect asthma exacerbation as etiology of symptoms.  No evidence of acute infection on physical exam though would warrant initiation of antibiotics at this time.  Chest x-ray was obtained and I do not see any evidence of pneumonia based on my primary read but did see some peribronchial thickening consistent with asthma.  We were waiting for radiologist over read at the time of discharge and we will contact her if this is abnormal and changes our treatment plan.  She does have a nebulizer machine at home and so was sent home with tubing and instruction to use albuterol nebulizer solution every 4-6 hours as needed for shortness of breath and coughing  fits in place of her rescue inhaler.  She is to continue Advair as previously recommended.  Will start longer prednisone taper and we discussed that this should not be taken with NSAIDs due to risk of GI bleeding.  She can use Mucinex, Flonase, Tylenol for additional symptom relief.  Recommended that she rest and drink plenty of fluid.  If her symptoms are not improving within a few days or if anything worsens she needs to be seen immediately.  Strict return precautions given.  Work excuse note provided.  Final Clinical Impressions(s) / UC Diagnoses   Final diagnoses:  Moderate persistent asthma with acute exacerbation  Acute cough  SOB (shortness of breath)     Discharge Instructions      I believe that you are having an asthma exacerbation.  I do not see any evidence of pneumonia on your x-ray but we will contact you if the radiologist sees something that I missed.  Start prednisone tomorrow (08/23/2023).  Do not take NSAIDs with this medication including aspirin, ibuprofen/Advil, naproxen/Aleve.  Use the albuterol nebulizer solution every 4-6 hours as needed in place of your inhaler.  Continue your Advair as previously prescribed.  Use over-the-counter medications including Mucinex, Flonase, Tylenol.  If your symptoms are not improving within a few days please return for reevaluation.  If anything worsens you have worsening cough, shortness of breath, fever, weakness, chest pain you need to be seen immediately.     ED Prescriptions     Medication Sig Dispense Auth. Provider   albuterol (PROVENTIL) (2.5 MG/3ML) 0.083% nebulizer solution Take 3 mLs (2.5 mg total) by nebulization every 6 (six) hours as needed for wheezing or shortness of breath. 75 mL Elishua Radford K, PA-C   predniSONE (STERAPRED UNI-PAK 21 TAB) 10 MG (21) TBPK tablet Take by mouth daily. Take 6 tabs by mouth daily  for 2 days, then 5 tabs for 2 days, then 4 tabs for 2 days, then 3 tabs for 2 days, 2 tabs for 2 days, then 1 tab  by mouth daily for 2 days 42 tablet Darnice Comrie K, PA-C      PDMP not reviewed this encounter.   Jeani Hawking, PA-C 08/22/23 1524

## 2023-08-22 NOTE — Discharge Instructions (Signed)
I believe that you are having an asthma exacerbation.  I do not see any evidence of pneumonia on your x-ray but we will contact you if the radiologist sees something that I missed.  Start prednisone tomorrow (08/23/2023).  Do not take NSAIDs with this medication including aspirin, ibuprofen/Advil, naproxen/Aleve.  Use the albuterol nebulizer solution every 4-6 hours as needed in place of your inhaler.  Continue your Advair as previously prescribed.  Use over-the-counter medications including Mucinex, Flonase, Tylenol.  If your symptoms are not improving within a few days please return for reevaluation.  If anything worsens you have worsening cough, shortness of breath, fever, weakness, chest pain you need to be seen immediately.

## 2023-08-22 NOTE — Progress Notes (Signed)
Because you were just treated for a lower respiratory infection a month ago- your condition warrants further evaluation and I recommend that you be seen in a face to face visit. You can call your PCP and or go to the nearest Urgent Care.   NOTE: There will be NO CHARGE for this eVisit   If you are having a true medical emergency please call 911.

## 2023-08-22 NOTE — ED Triage Notes (Signed)
Patient is here with cough and congestion x 2 weeks. Pt has taken mucinex with no relief.

## 2023-11-10 ENCOUNTER — Other Ambulatory Visit: Payer: Self-pay | Admitting: Internal Medicine

## 2023-11-10 DIAGNOSIS — J329 Chronic sinusitis, unspecified: Secondary | ICD-10-CM

## 2023-11-12 ENCOUNTER — Other Ambulatory Visit (HOSPITAL_COMMUNITY): Payer: Self-pay

## 2023-11-13 ENCOUNTER — Other Ambulatory Visit (HOSPITAL_COMMUNITY): Payer: Self-pay

## 2023-11-15 ENCOUNTER — Other Ambulatory Visit (HOSPITAL_COMMUNITY): Payer: Self-pay

## 2023-11-15 MED ORDER — MELOXICAM 15 MG PO TABS: 15.0000 mg | ORAL_TABLET | Freq: Every day | ORAL | 1 refills | Status: DC

## 2023-11-15 MED ORDER — FLUTICASONE-SALMETEROL 230-21 MCG/ACT IN AERO: 1.0000 | INHALATION_SPRAY | Freq: Two times a day (BID) | RESPIRATORY_TRACT | 1 refills | Status: DC

## 2023-11-15 MED ORDER — CETIRIZINE HCL 10 MG PO TABS: 10.0000 mg | ORAL_TABLET | Freq: Every day | ORAL | 2 refills | Status: DC

## 2023-11-15 MED ORDER — SERTRALINE HCL 25 MG PO TABS: 25.0000 mg | ORAL_TABLET | Freq: Every day | ORAL | 1 refills | Status: DC

## 2023-11-18 ENCOUNTER — Other Ambulatory Visit (HOSPITAL_COMMUNITY): Payer: Self-pay

## 2023-11-20 ENCOUNTER — Other Ambulatory Visit (HOSPITAL_COMMUNITY): Payer: Self-pay

## 2023-11-20 MED FILL — Fluticasone Propionate Nasal Susp 50 MCG/ACT: NASAL | 30 days supply | Qty: 16 | Fill #0 | Status: AC

## 2023-11-28 ENCOUNTER — Other Ambulatory Visit (HOSPITAL_COMMUNITY): Payer: Self-pay

## 2023-11-28 ENCOUNTER — Telehealth: Payer: 59 | Admitting: Physician Assistant

## 2023-11-28 DIAGNOSIS — B3731 Acute candidiasis of vulva and vagina: Secondary | ICD-10-CM

## 2023-11-28 MED ORDER — FLUCONAZOLE 150 MG PO TABS
150.0000 mg | ORAL_TABLET | Freq: Every day | ORAL | 0 refills | Status: DC
  Filled 2023-11-28: qty 1, 1d supply, fill #0

## 2023-11-28 NOTE — Progress Notes (Signed)

## 2023-11-28 NOTE — Progress Notes (Signed)
I have spent 5 minutes in review of e-visit questionnaire, review and updating patient chart, medical decision making and response to patient.   [name], PA-C

## 2023-12-20 DIAGNOSIS — R609 Edema, unspecified: Secondary | ICD-10-CM | POA: Diagnosis not present

## 2023-12-20 DIAGNOSIS — M79605 Pain in left leg: Secondary | ICD-10-CM | POA: Diagnosis not present

## 2023-12-20 DIAGNOSIS — I872 Venous insufficiency (chronic) (peripheral): Secondary | ICD-10-CM | POA: Diagnosis not present

## 2023-12-20 DIAGNOSIS — Z6839 Body mass index (BMI) 39.0-39.9, adult: Secondary | ICD-10-CM | POA: Diagnosis not present

## 2023-12-20 DIAGNOSIS — I871 Compression of vein: Secondary | ICD-10-CM | POA: Diagnosis not present

## 2023-12-20 DIAGNOSIS — R102 Pelvic and perineal pain: Secondary | ICD-10-CM | POA: Diagnosis not present

## 2023-12-25 DIAGNOSIS — M25562 Pain in left knee: Secondary | ICD-10-CM | POA: Diagnosis not present

## 2024-01-22 DIAGNOSIS — M25562 Pain in left knee: Secondary | ICD-10-CM | POA: Diagnosis not present

## 2024-01-24 ENCOUNTER — Other Ambulatory Visit: Payer: Self-pay | Admitting: Obstetrics & Gynecology

## 2024-01-24 DIAGNOSIS — Z0000 Encounter for general adult medical examination without abnormal findings: Secondary | ICD-10-CM

## 2024-01-29 ENCOUNTER — Other Ambulatory Visit (HOSPITAL_COMMUNITY): Payer: Self-pay

## 2024-01-29 ENCOUNTER — Ambulatory Visit (INDEPENDENT_AMBULATORY_CARE_PROVIDER_SITE_OTHER): Payer: Self-pay | Admitting: Student

## 2024-01-29 ENCOUNTER — Other Ambulatory Visit: Payer: Self-pay

## 2024-01-29 ENCOUNTER — Ambulatory Visit
Admission: RE | Admit: 2024-01-29 | Discharge: 2024-01-29 | Disposition: A | Discharge status: Home / Self Care | Source: Ambulatory Visit | Attending: Obstetrics & Gynecology | Admitting: Obstetrics & Gynecology

## 2024-01-29 VITALS — BP 105/45 | HR 79 | Temp 98.3°F | Ht 71.0 in | Wt 273.0 lb

## 2024-01-29 DIAGNOSIS — J4541 Moderate persistent asthma with (acute) exacerbation: Secondary | ICD-10-CM

## 2024-01-29 DIAGNOSIS — G514 Facial myokymia: Secondary | ICD-10-CM | POA: Diagnosis not present

## 2024-01-29 DIAGNOSIS — Z0000 Encounter for general adult medical examination without abnormal findings: Secondary | ICD-10-CM

## 2024-01-29 DIAGNOSIS — R252 Cramp and spasm: Secondary | ICD-10-CM | POA: Diagnosis not present

## 2024-01-29 DIAGNOSIS — F32A Depression, unspecified: Secondary | ICD-10-CM

## 2024-01-29 DIAGNOSIS — J45909 Unspecified asthma, uncomplicated: Secondary | ICD-10-CM

## 2024-01-29 DIAGNOSIS — E559 Vitamin D deficiency, unspecified: Secondary | ICD-10-CM

## 2024-01-29 DIAGNOSIS — J329 Chronic sinusitis, unspecified: Secondary | ICD-10-CM

## 2024-01-29 DIAGNOSIS — K219 Gastro-esophageal reflux disease without esophagitis: Secondary | ICD-10-CM

## 2024-01-29 MED ORDER — FLUTICASONE-SALMETEROL 230-21 MCG/ACT IN AERO
1.0000 | INHALATION_SPRAY | Freq: Two times a day (BID) | RESPIRATORY_TRACT | 1 refills | Status: DC
  Filled 2024-01-29: qty 12, 30d supply, fill #0

## 2024-01-29 MED ORDER — PANTOPRAZOLE SODIUM 40 MG PO TBEC
40.0000 mg | DELAYED_RELEASE_TABLET | Freq: Two times a day (BID) | ORAL | 3 refills | Status: AC
  Filled 2024-01-29: qty 60, 30d supply, fill #0
  Filled 2024-11-06: qty 180, 90d supply, fill #0

## 2024-01-29 MED ORDER — SERTRALINE HCL 25 MG PO TABS
25.0000 mg | ORAL_TABLET | Freq: Every day | ORAL | 1 refills | Status: DC
  Filled 2024-01-29: qty 30, 30d supply, fill #0

## 2024-01-29 MED ORDER — FLUTICASONE PROPIONATE 50 MCG/ACT NA SUSP
NASAL | 0 refills | Status: AC
  Filled 2024-01-29: qty 16, 30d supply, fill #0

## 2024-01-29 MED ORDER — CETIRIZINE HCL 10 MG PO TABS
10.0000 mg | ORAL_TABLET | Freq: Every day | ORAL | 2 refills | Status: AC
  Filled 2024-01-29: qty 30, 30d supply, fill #0

## 2024-01-29 MED ORDER — ALBUTEROL SULFATE HFA 108 (90 BASE) MCG/ACT IN AERS
2.0000 | INHALATION_SPRAY | RESPIRATORY_TRACT | 1 refills | Status: DC | PRN
  Filled 2024-01-29 – 2024-06-15 (×3): qty 6.7, 30d supply, fill #0

## 2024-01-29 NOTE — Assessment & Plan Note (Addendum)
 Patient endorses twitching of the right eyelid for the last two weeks.  Denies caffeine intake, though she states that she has been sleeping a bit less than usual and has insomnia at baseline.  She does identify any new life stressors at this time.  Otherwise no forceful shutting of the eyelid, no blurry vision, no pain, no pain with ocular movement.  Instructed patient that this should self resolve, likely benign.

## 2024-01-29 NOTE — Progress Notes (Signed)
 Subjective:  CC: Right eye twitching    HPI:  [name] is a 48 y.o. person with a past medical history stated below and presents today for the stated chief complaint. Please see problem based assessment and plan for additional details.  Past Medical History:  Diagnosis Date   Abdominal pain    Acute respiratory failure with hypoxia (HCC) 01/14/2023   Anemia    Asthma    Depression    Diarrhea    Family history of DVT 03/21/2020   Flexor tenosynovitis of finger 12/03/2022   GERD without esophagitis 12/24/2021   Heart murmur    Hypertension    Hypokalemia 01/01/2012   Influenza A 01/15/2023   Irritable larynx syndrome 01/15/2023   Obesity    Plantar fasciitis    Plantar fasciitis 12/28/2019   Shortness of breath    Trichomonal vaginitis 07/23/2022   Uterine leiomyoma 10/05/2020   Vomiting     Current Outpatient Medications on File Prior to Visit  Medication Sig Dispense Refill   acetaminophen (TYLENOL) 500 MG tablet Take 500 mg by mouth as needed for moderate pain.     Cholecalciferol (VITAMIN D3) 10 MCG (400 UNIT) tablet Take 2 tablets by mouth daily. 150 tablet 0   No current facility-administered medications on file prior to visit.    Review of Systems: Please see assessment and plan for pertinent positives and negatives.  Objective:   Vitals:   01/29/24 0940  BP: (!) 105/45  Pulse: 79  Temp: 98.3 F (36.8 C)  TempSrc: Oral  SpO2: 100%  Weight: 273 lb (123.8 kg)  Height: 5\' 11"  (1.803 m)    Physical Exam: Constitutional: Well-appearing Cardiovascular: Regular rate and rhythm Pulmonary/Chest: lungs clear to auscultation bilaterally Eyes: Pupils equal, round, reactive to light. Extremities: No edema of the lower extremities bilaterally Psych: Pleasant affect Thought process is linear and is goal-directed.     Assessment & Plan:  Chronic asthma Lungs clear to rotation today, patient taking Advair daily and albuterol as needed.   States that she has been requiring this medication a bit more than usual due to all the pollen and allergens in the air.  I have refilled this medications for her as well as her Flonase and Zyrtec.  Eyelid myokymia Patient endorses twitching of the right eyelid for the last two weeks.  Denies caffeine intake, though she states that she has been sleeping a bit less than usual and has insomnia at baseline.  She does identify any new life stressors at this time.  Otherwise no forceful shutting of the eyelid, no blurry vision, no pain, no pain with ocular movement.  Instructed patient that this should self resolve, likely benign.  Leg cramping Ongoing for the past 2 months, states that this has been a problem for her in the past as well.  She does have some issues with hand pain secondary to carpal tunnel, she states she had a procedure for this but feels that her symptoms have returned and may be even slightly worsened.  She states her lower extremity cramping occurs at night, but also at rest.  She must wait for them to pass with time.  She states this has happened to her in the past when she had low potassium, she is taking her albuterol inhaler more than usual, which may be a possible source of this.  She has history of vitamin D deficiency but states she has been taking her vitamin D pretty consistently until a couple  days ago when she ran out. Plan: BMP Mag Vitamin D   Patient discussed with Dr. Willow Ora MD [facility]  PGY-1 Pager: [phone]  Phone: [phone] Date 01/29/2024  Time 10:04 AM

## 2024-01-29 NOTE — Assessment & Plan Note (Signed)
 Lungs clear to rotation today, patient taking Advair daily and albuterol as needed.  States that she has been requiring this medication a bit more than usual due to all the pollen and allergens in the air.  I have refilled this medications for her as well as her Flonase and Zyrtec.

## 2024-01-29 NOTE — Patient Instructions (Addendum)
 Thank you, Ms.[name] for allowing [place] to provide your care today.  I have ordered the following tests for you:  Lab Orders         Magnesium         BMP8+Anion Gap         Vitamin D (25 hydroxy)       Referrals ordered today:   Referral Orders  No referral(s) requested today     I have ordered the following medication/changed the following medications:   Stop the following medications: Medications Discontinued During This Encounter  Medication Reason   albuterol (PROVENTIL) (2.5 MG/3ML) 0.083% nebulizer solution    cetirizine (ZYRTEC ALLERGY) 10 MG tablet    fluconazole (DIFLUCAN) 150 MG tablet    fluticasone-salmeterol (ADVAIR HFA) 230-21 MCG/ACT inhaler    guaiFENesin (MUCINEX) 600 MG 12 hr tablet    ondansetron (ZOFRAN-ODT) 4 MG disintegrating tablet    meloxicam (MOBIC) 15 MG tablet    predniSONE (STERAPRED UNI-PAK 21 TAB) 10 MG (21) TBPK tablet    sertraline (ZOLOFT) 25 MG tablet    albuterol (VENTOLIN HFA) 108 (90 Base) MCG/ACT inhaler Reorder   sertraline (ZOLOFT) 25 MG tablet    fluticasone-salmeterol (ADVAIR HFA) 230-21 MCG/ACT inhaler    pantoprazole (PROTONIX) 40 MG tablet Reorder   fluticasone (FLONASE) 50 MCG/ACT nasal spray Reorder   cetirizine (ZYRTEC) 10 MG tablet Reorder     Start the following medications: Meds ordered this encounter  Medications   albuterol (VENTOLIN HFA) 108 (90 Base) MCG/ACT inhaler    Sig: Inhale 2 puffs into the lungs every 4 (four) hours as needed for wheezing or shortness of breath.    Dispense:  6.7 g    Refill:  1   cetirizine (ZYRTEC) 10 MG tablet    Sig: Take 1 tablet (10 mg total) by mouth daily.    Dispense:  30 tablet    Refill:  2   fluticasone (FLONASE) 50 MCG/ACT nasal spray    Sig: SHAKE LIQUID AND USE 2 SPRAYS IN EACH NOSTRIL DAILY    Dispense:  16 g    Refill:  0   fluticasone-salmeterol (ADVAIR HFA) 230-21 MCG/ACT inhaler    Sig: Inhale 1 puff into the lungs 2 (two) times daily.    Dispense:  12  g    Refill:  1   pantoprazole (PROTONIX) 40 MG tablet    Sig: Take 1 tablet (40 mg total) by mouth 2 (two) times daily before a meal.    Dispense:  60 tablet    Refill:  3   sertraline (ZOLOFT) 25 MG tablet    Sig: Take 1 tablet (25 mg total) by mouth daily.    Dispense:  30 tablet    Refill:  1     Follow up: 2 months for A1c and discussion regarding mood / legs.      We look forward to seeing you next time. Please call our clinic at [phone] if you have any questions or concerns. The best time to call is Monday-Friday from 9am-4pm, but there is someone available 24/7. If after hours or the weekend, call the main hospital number and ask for the Internal Medicine Resident On-Call. If you need medication refills, please notify your pharmacy one week in advance and they will send [place] a request.   Thank you for trusting me with your care. Wishing you the best!  Lovie Macadamia MD [facility]

## 2024-01-29 NOTE — Assessment & Plan Note (Signed)
 Ongoing for the past 2 months, states that this has been a problem for her in the past as well.  She does have some issues with hand pain secondary to carpal tunnel, she states she had a procedure for this but feels that her symptoms have returned and may be even slightly worsened.  She states her lower extremity cramping occurs at night, but also at rest.  She must wait for them to pass with time.  She states this has happened to her in the past when she had low potassium, she is taking her albuterol inhaler more than usual, which may be a possible source of this.  She has history of vitamin D deficiency but states she has been taking her vitamin D pretty consistently until a couple days ago when she ran out. Plan: BMP Mag Vitamin D

## 2024-01-30 LAB — BMP8+ANION GAP
Anion Gap: 14 mmol/L (ref 10.0–18.0)
BUN/Creatinine Ratio: 19 (ref 9–23)
BUN: 15 mg/dL (ref 6–24)
CO2: 22 mmol/L (ref 20–29)
Calcium: 8.8 mg/dL (ref 8.7–10.2)
Chloride: 104 mmol/L (ref 96–106)
Creatinine, Ser: 0.8 mg/dL (ref 0.57–1.00)
Glucose: 91 mg/dL (ref 70–99)
Potassium: 4.2 mmol/L (ref 3.5–5.2)
Sodium: 140 mmol/L (ref 134–144)
eGFR: 91 mL/min/{1.73_m2} (ref >59)

## 2024-01-30 LAB — VITAMIN D 25 HYDROXY (VIT D DEFICIENCY, FRACTURES): Vit D, 25-Hydroxy: 13.8 ng/mL — ABNORMAL LOW (ref 30.0–100.0)

## 2024-01-30 LAB — MAGNESIUM: Magnesium: 2.1 mg/dL (ref 1.6–2.3)

## 2024-01-31 ENCOUNTER — Encounter: Payer: Self-pay | Admitting: Obstetrics & Gynecology

## 2024-02-05 ENCOUNTER — Other Ambulatory Visit: Payer: Self-pay | Admitting: Student

## 2024-02-05 MED ORDER — VITAMIN D (ERGOCALCIFEROL) 1.25 MG (50000 UNIT) PO CAPS
50000.0000 [IU] | ORAL_CAPSULE | ORAL | 0 refills | Status: DC
  Filled 2024-02-05: qty 6, 42d supply, fill #0

## 2024-02-05 NOTE — Progress Notes (Signed)
 I spoke with [name] on the phone. Patient's identity was confirmed using two patient specific identifiers. We discussed her lab work, vit D level low, will replete with 6 weeks high dose vit D.

## 2024-02-06 ENCOUNTER — Other Ambulatory Visit (HOSPITAL_COMMUNITY): Payer: Self-pay

## 2024-02-08 NOTE — Progress Notes (Signed)
 Internal Medicine Clinic Attending  Case discussed with the resident at the time of the visit.  We reviewed the resident's history and exam and pertinent patient test results.  I agree with the assessment, diagnosis, and plan of care documented in the resident's note.

## 2024-03-25 DIAGNOSIS — M25562 Pain in left knee: Secondary | ICD-10-CM | POA: Diagnosis not present

## 2024-03-26 ENCOUNTER — Other Ambulatory Visit: Payer: Self-pay | Admitting: Student

## 2024-03-26 NOTE — Telephone Encounter (Signed)
 Copied from CRM [id number]. Topic: Clinical - Medication Refill >> Mar 26, 2024  3:29 PM Carrielelia G wrote: Medication: Vitamin D , Ergocalciferol , (DRISDOL ) 1.25 MG (50000 UNIT) CAPS capsule  Has the patient contacted their pharmacy? No (Agent: If no, request that the patient contact the pharmacy for the refill. If patient does not wish to contact the pharmacy document the reason why and proceed with request.) (Agent: If yes, when and what did the pharmacy advise?)  This is the patient's preferred pharmacy:   [facility] [address] [place] [address] Phone: [phone] Fax: [phone]  Is this the correct pharmacy for this prescription? Yes If no, delete pharmacy and type the correct one.   Has the prescription been filled recently? Yes  Is the patient out of the medication? Yes  Has the patient been seen for an appointment in the last year OR does the patient have an upcoming appointment? Yes  Can we respond through MyChart? Yes  Agent: Please be advised that Rx refills may take up to 3 business days. We ask that you follow-up with your pharmacy.

## 2024-03-30 ENCOUNTER — Other Ambulatory Visit (HOSPITAL_COMMUNITY): Payer: Self-pay

## 2024-03-30 MED ORDER — VITAMIN D (ERGOCALCIFEROL) 1.25 MG (50000 UNIT) PO CAPS
50000.0000 [IU] | ORAL_CAPSULE | ORAL | 0 refills | Status: AC
  Filled 2024-03-30 – 2024-06-15 (×4): qty 6, 42d supply, fill #0

## 2024-04-09 ENCOUNTER — Other Ambulatory Visit (HOSPITAL_COMMUNITY): Payer: Self-pay

## 2024-04-24 ENCOUNTER — Encounter: Payer: Self-pay | Admitting: Student

## 2024-04-24 ENCOUNTER — Other Ambulatory Visit (HOSPITAL_BASED_OUTPATIENT_CLINIC_OR_DEPARTMENT_OTHER): Payer: Self-pay

## 2024-04-24 ENCOUNTER — Ambulatory Visit (INDEPENDENT_AMBULATORY_CARE_PROVIDER_SITE_OTHER): Payer: Self-pay | Admitting: Student

## 2024-04-24 ENCOUNTER — Other Ambulatory Visit (HOSPITAL_COMMUNITY): Payer: Self-pay

## 2024-04-24 ENCOUNTER — Other Ambulatory Visit: Payer: Self-pay

## 2024-04-24 ENCOUNTER — Telehealth: Payer: Self-pay

## 2024-04-24 VITALS — BP 117/56 | HR 74 | Temp 97.9°F | Ht 71.0 in | Wt 272.0 lb

## 2024-04-24 DIAGNOSIS — E559 Vitamin D deficiency, unspecified: Secondary | ICD-10-CM | POA: Diagnosis not present

## 2024-04-24 DIAGNOSIS — R040 Epistaxis: Secondary | ICD-10-CM | POA: Diagnosis not present

## 2024-04-24 DIAGNOSIS — G4700 Insomnia, unspecified: Secondary | ICD-10-CM | POA: Diagnosis not present

## 2024-04-24 DIAGNOSIS — R7303 Prediabetes: Secondary | ICD-10-CM | POA: Diagnosis not present

## 2024-04-24 DIAGNOSIS — F32A Depression, unspecified: Secondary | ICD-10-CM

## 2024-04-24 LAB — POCT GLYCOSYLATED HEMOGLOBIN (HGB A1C): Hemoglobin A1C: 5.4 % (ref 4.0–5.6)

## 2024-04-24 LAB — GLUCOSE, CAPILLARY: Glucose-Capillary: 96 mg/dL (ref 70–99)

## 2024-04-24 MED ORDER — DOXEPIN HCL 10 MG PO CAPS
10.0000 mg | ORAL_CAPSULE | Freq: Every evening | ORAL | 0 refills | Status: AC | PRN
  Filled 2024-04-24: qty 30, 30d supply, fill #0

## 2024-04-24 MED ORDER — DOXEPIN HCL 6 MG PO TABS
6.0000 mg | ORAL_TABLET | Freq: Every evening | ORAL | 1 refills | Status: DC | PRN
  Filled 2024-04-24: qty 30, 30d supply, fill #0

## 2024-04-24 MED ORDER — DOXEPIN HCL 10 MG/ML PO CONC
6.0000 mg | Freq: Every day | ORAL | 0 refills | Status: AC
  Filled 2024-04-24: qty 54, 90d supply, fill #0
  Filled 2024-09-06: qty 54, 90d supply, fill #1

## 2024-04-24 NOTE — Assessment & Plan Note (Signed)
 A1c previously 5.9 and today is 5.4.  Outside of prediabetic range now.

## 2024-04-24 NOTE — Assessment & Plan Note (Signed)
 Patient reports recurrent nosebleeds that she would like to see ENT about.  This was mentioned at the end of the visit but she does have an ENT referral from last year that is now expired so we will renew this.

## 2024-04-24 NOTE — Assessment & Plan Note (Addendum)
 Returns today for follow-up for fatigue and low mood which she attributes to significant insomnia that has been chronic.  It seems like for the last several months/years she has slept roughly 3 to 4 hours at night most nights.  For the last several months she works a job from 7 PM to 3 AM and will usually get in bed around 12 PM before she wakes up at 3 PM.  Even if she goes to sleep at 9 AM or earlier she will still wake up about 3 hours after going to sleep.  She has tried melatonin and NyQuil without much benefit.  She does not drink alcohol or caffeine .  She reports good sleep hygiene.  She was referred to sleep study last year but has not done this.  Discussed cotreatment of depression and insomnia and she would like to focus on the insomnia because she states this is the main driver of her depressed mood. - Referral for CBT-I - Renew referral for sleep study - Recommend melatonin 3-5 mg nightly and continued good sleep hygiene - Start doxepin 6 mg nightly as needed for sleep Addendum- 6 mg tabs are not covered by her insurance. We will try to get liquid for of medication and do 6 mg or use 10 mg capsule

## 2024-04-24 NOTE — Telephone Encounter (Signed)
 Monica from [facility] pharmacy called regarding a rx for Doxepin tablets. Per Odella the patients insurance does not cover the tablets but they will cover the capsules. Please send in a new rx for Doxepin capsules.

## 2024-04-24 NOTE — Addendum Note (Signed)
 Addended by: Jamal Pavon on: 04/24/2024 02:26 PM   Modules accepted: Orders

## 2024-04-24 NOTE — Patient Instructions (Signed)
 Thank you, [name], for allowing us  to provide your care today. Today we discussed . . .  > Insomnia       - I would like to do a few things today to help with your symptoms.  I would like you to call the sleep medicine clinic at the number below and schedule sleep study.  I would also like to refer you for counseling specifically for insomnia and start a medication called doxepin.  You will take this medication within 30 minutes of bedtime and at least 3 hours from your last meal.  Will start at 6 mg.  The most common side effects of this medication are sedation, nausea, and upper respiratory tract infection but there is a risk of worsening depression so if you have any symptoms of worsening depression please stop the medication and let us  know right away.  [facility] clinic in [place], [place]  Address: [address] [place], [place] [address] Phone: [phone]  [facility] [place] Formerly [facility] [address], [place] [address] Contact Us  [phone]   I have ordered the following labs for you:   Lab Orders         Vitamin D  (25 hydroxy)         POC Hbg A1C       Follow up: 4-6 weeks    Remember:  Should you have any questions or concerns please call the internal medicine clinic at [phone].     Fairy Pool, DO [facility]

## 2024-04-24 NOTE — Progress Notes (Addendum)
 CC: Routine Follow Up for fatigue and insomnia after last office visit 01/29/2024  HPI:  [name] is a 48 y.o. female with pertinent PMH of asthma, GERD, prediabetes, obesity, and depression who presents as above. Please see assessment and plan below for further details.  Medications: Current Outpatient Medications  Medication Instructions   acetaminophen  (TYLENOL ) 500 mg, Oral, As needed   albuterol  (VENTOLIN  HFA) 108 (90 Base) MCG/ACT inhaler 2 puffs, Inhalation, Every 4 hours PRN   cetirizine  (ZYRTEC ) 10 mg, Oral, Daily   Cholecalciferol  (VITAMIN D3) 10 MCG (400 UNIT) tablet Take 2 tablets by mouth daily.   doxepin (SINEQUAN) 6 mg, Oral, Daily at bedtime   doxepin (SINEQUAN) 10 mg, Oral, At bedtime PRN   fluticasone  (FLONASE ) 50 MCG/ACT nasal spray SHAKE LIQUID AND USE 2 SPRAYS IN EACH NOSTRIL DAILY   fluticasone -salmeterol (ADVAIR  HFA) 230-21 MCG/ACT inhaler 1 puff, Inhalation, 2 times daily   pantoprazole  (PROTONIX ) 40 mg, Oral, 2 times daily before meals   Vitamin D  (Ergocalciferol ) (DRISDOL ) 50,000 Units, Oral, Every 7 days     Review of Systems:   Pertinent items noted in HPI and/or A&P.  Physical Exam:  Vitals:   04/24/24 1009  BP: (!) 117/56  Pulse: 74  Temp: 97.9 F (36.6 C)  TempSrc: Oral  SpO2: 98%  Weight: 272 lb (123.4 kg)  Height: 5' 11 (1.803 m)    Constitutional: Well-appearing adult female. In no acute distress. HEENT: Normocephalic, atraumatic, Sclera non-icteric, PERRL, EOM intact Pulm: Normal work of breathing on room air. FDX:Wzhjupcz for extremity edema. Skin:Warm and dry. Neuro:Alert and oriented x3. No focal deficit noted. Psych:Pleasant mood and affect.   Assessment & Plan:   Insomnia Returns today for follow-up for fatigue and low mood which she attributes to significant insomnia that has been chronic.  It seems like for the last several months/years she has slept roughly 3 to 4 hours at night most nights.  For the last  several months she works a job from 7 PM to 3 AM and will usually get in bed around 12 PM before she wakes up at 3 PM.  Even if she goes to sleep at 9 AM or earlier she will still wake up about 3 hours after going to sleep.  She has tried melatonin and NyQuil without much benefit.  She does not drink alcohol or caffeine .  She reports good sleep hygiene.  She was referred to sleep study last year but has not done this.  Discussed cotreatment of depression and insomnia and she would like to focus on the insomnia because she states this is the main driver of her depressed mood. - Referral for CBT-I - Renew referral for sleep study - Recommend melatonin 3-5 mg nightly and continued good sleep hygiene - Start doxepin 6 mg nightly as needed for sleep Addendum- 6 mg tabs are not covered by her insurance. We will try to get liquid for of medication and do 6 mg or use 10 mg capsule  Frequent nosebleeds Patient reports recurrent nosebleeds that she would like to see ENT about.  This was mentioned at the end of the visit but she does have an ENT referral from last year that is now expired so we will renew this.  Depression, unspecified PHQ-9 score up to 7 indicating mild depression compared to 0 on 01/29/2024.  She attributes this completely to her insomnia and does not want to start or restart a medication like sertraline  that she was on previously.  She has not taken this medication in over a month.  Prediabetes A1c previously 5.9 and today is 5.4.  Outside of prediabetic range now.    Patient discussed with Dr. MICAEL Riis Winfrey  Fairy Pool, DO Internal [facility] Center Internal Medicine Resident PGY-2 Clinic Phone: [phone] Please contact the on call pager at [phone] for any urgent or emergent needs.

## 2024-04-24 NOTE — Assessment & Plan Note (Signed)
 PHQ-9 score up to 7 indicating mild depression compared to 0 on 01/29/2024.  She attributes this completely to her insomnia and does not want to start or restart a medication like sertraline  that she was on previously.  She has not taken this medication in over a month.

## 2024-04-25 LAB — VITAMIN D 25 HYDROXY (VIT D DEFICIENCY, FRACTURES): Vit D, 25-Hydroxy: 25.7 ng/mL — ABNORMAL LOW (ref 30.0–100.0)

## 2024-04-27 ENCOUNTER — Other Ambulatory Visit (HOSPITAL_COMMUNITY): Payer: Self-pay

## 2024-04-27 NOTE — Progress Notes (Signed)
 Internal Medicine Clinic Attending  Case discussed with the resident at the time of the visit.  We reviewed the resident's history and exam and pertinent patient test results.  I agree with the assessment, diagnosis, and plan of care documented in the resident's note.

## 2024-04-29 ENCOUNTER — Ambulatory Visit: Payer: Self-pay | Admitting: Student

## 2024-04-29 NOTE — Progress Notes (Signed)
 Vitamin D  level with nearly full response to replacement. Will recommend that the patient increases vitamin d3 from 400 to 1000 international units daily.

## 2024-05-06 ENCOUNTER — Other Ambulatory Visit (HOSPITAL_COMMUNITY): Payer: Self-pay

## 2024-05-18 DIAGNOSIS — M25562 Pain in left knee: Secondary | ICD-10-CM | POA: Diagnosis not present

## 2024-05-21 DIAGNOSIS — R040 Epistaxis: Secondary | ICD-10-CM | POA: Diagnosis not present

## 2024-06-09 ENCOUNTER — Other Ambulatory Visit (HOSPITAL_COMMUNITY): Payer: Self-pay

## 2024-06-15 ENCOUNTER — Other Ambulatory Visit (HOSPITAL_COMMUNITY): Payer: Self-pay

## 2024-06-16 ENCOUNTER — Encounter: Payer: Self-pay | Admitting: Pharmacist

## 2024-06-16 ENCOUNTER — Other Ambulatory Visit: Payer: Self-pay

## 2024-06-18 ENCOUNTER — Other Ambulatory Visit: Payer: Self-pay

## 2024-07-29 ENCOUNTER — Ambulatory Visit: Payer: Self-pay

## 2024-07-29 NOTE — Telephone Encounter (Signed)
 RTC to patient informed her that we have no available appointment ans d with her symptoms she needs to go to the ER or Urgent Care  for an Xray.  Patient agreed to go to the ER or Urgent Care abut still wanted to schedule a follow up appointment.  Sent to Jada to schedule appointment at next available next week.

## 2024-07-29 NOTE — Telephone Encounter (Signed)
 FYI Only or Action Required?: Action required by provider: request for appointment. Refused UC or ED recommendation.   Patient was last seen in primary care on 04/24/2024 by Jolaine Pac, DO.  Called Nurse Triage reporting Shortness of Breath.  Symptoms began several weeks ago.  Interventions attempted: Rest, hydration, or home remedies.  Symptoms are: gradually worsening.  Triage Disposition: Go to ED Now (Notify PCP)  Patient/caregiver understands and will follow disposition?: No, refuses disposition  Copied from CRM [id number]. Topic: Clinical - Red Word Triage >> Jul 29, 2024  2:46 PM Suzette B wrote: [facility] that prompted transfer to Nurse Triage: patient states she's had COVID 2 times in the past few months, she is constantly hearting, asthma flaring, SOB, and not sure what else to do at this point. Reason for Disposition  [1] MODERATE difficulty breathing (e.g., speaks in phrases, SOB even at rest, pulse 100-120) AND [2] NEW-onset or WORSE than normal  Answer Assessment - Initial Assessment Questions 1. RESPIRATORY STATUS: Describe your breathing? (e.g., wheezing, shortness of breath, unable to speak, severe coughing)      Shortness of breath 2. ONSET: When did this breathing problem begin?      Three weeks ago 3. PATTERN Does the difficult breathing come and go, or has it been constant since it started?      constant 4. SEVERITY: How bad is your breathing? (e.g., mild, moderate, severe)      moderate 5. RECURRENT SYMPTOM: Have you had difficulty breathing before? If Yes, ask: When was the last time? and What happened that time?      Laurence couple of weeks ago 6. CARDIAC HISTORY: Do you have any history of heart disease? (e.g., heart attack, angina, bypass surgery, angioplasty)      no 7. LUNG HISTORY: Do you have any history of lung disease?  (e.g., pulmonary embolus, asthma, emphysema)     yes 8. CAUSE: What do you think is causing the breathing  problem?      unsure 9. OTHER SYMPTOMS: Do you have any other symptoms? (e.g., chest pain, cough, dizziness, fever, runny nose)     Cough, chest pain from coughing, dizziness 10. O2 SATURATION MONITOR:  Do you use an oxygen saturation monitor (pulse oximeter) at home? If Yes, ask: What is your reading (oxygen level) today? What is your usual oxygen saturation reading? (e.g., 95%)       NA 12. TRAVEL: Have you traveled out of the country in the last month? (e.g., travel history, exposures)       no  Protocols used: Breathing Difficulty-A-AH

## 2024-08-04 ENCOUNTER — Other Ambulatory Visit (HOSPITAL_COMMUNITY): Payer: Self-pay

## 2024-08-04 ENCOUNTER — Ambulatory Visit (INDEPENDENT_AMBULATORY_CARE_PROVIDER_SITE_OTHER)

## 2024-08-04 ENCOUNTER — Other Ambulatory Visit: Payer: Self-pay

## 2024-08-04 ENCOUNTER — Encounter: Payer: Self-pay | Admitting: Pharmacist

## 2024-08-04 VITALS — BP 127/65 | HR 66 | Temp 97.9°F | Ht 71.0 in | Wt 272.8 lb

## 2024-08-04 DIAGNOSIS — J45901 Unspecified asthma with (acute) exacerbation: Secondary | ICD-10-CM | POA: Diagnosis not present

## 2024-08-04 DIAGNOSIS — G4700 Insomnia, unspecified: Secondary | ICD-10-CM

## 2024-08-04 DIAGNOSIS — J4551 Severe persistent asthma with (acute) exacerbation: Secondary | ICD-10-CM

## 2024-08-04 DIAGNOSIS — Z7951 Long term (current) use of inhaled steroids: Secondary | ICD-10-CM

## 2024-08-04 DIAGNOSIS — Z91138 Patient's unintentional underdosing of medication regimen for other reason: Secondary | ICD-10-CM | POA: Diagnosis not present

## 2024-08-04 DIAGNOSIS — Z7952 Long term (current) use of systemic steroids: Secondary | ICD-10-CM | POA: Diagnosis not present

## 2024-08-04 DIAGNOSIS — Z0000 Encounter for general adult medical examination without abnormal findings: Secondary | ICD-10-CM

## 2024-08-04 DIAGNOSIS — J45909 Unspecified asthma, uncomplicated: Secondary | ICD-10-CM

## 2024-08-04 MED ORDER — PREDNISONE 20 MG PO TABS
40.0000 mg | ORAL_TABLET | Freq: Every day | ORAL | 0 refills | Status: DC
  Filled 2024-08-04 – 2024-08-10 (×2): qty 10, 5d supply, fill #0

## 2024-08-04 MED ORDER — BUDESONIDE-FORMOTEROL FUMARATE 160-4.5 MCG/ACT IN AERO
2.0000 | INHALATION_SPRAY | Freq: Two times a day (BID) | RESPIRATORY_TRACT | 12 refills | Status: AC
Start: 2024-08-04 — End: ?
  Filled 2024-08-04 – 2024-08-10 (×3): qty 10.2, 30d supply, fill #0

## 2024-08-04 NOTE — Progress Notes (Signed)
 Established Patient Office Visit  Subjective   Patient ID: [name], female    DOB: 09/17/1976  Age: 48 y.o. MRN: [id number]  Chief Complaint  Patient presents with   Medication Refill   Cough    HPI Patient is a 48 year old female PMH of prediabetes, GERD, frequent nosebleeds, depression, insomnia.  See problem-based assessment for more details.   ROS See problem-based assessment for more details   Objective:     BP 127/65 (BP Location: Right Arm, Patient Position: Sitting, Cuff Size: Normal)   Pulse 66   Temp 97.9 F (36.6 C) (Oral)   Ht 5' 11 (1.803 m)   Wt 272 lb 12.8 oz (123.7 kg)   SpO2 100%   BMI 38.05 kg/m  BP Readings from Last 3 Encounters:  08/04/24 127/65  04/24/24 (!) 117/56  01/29/24 (!) 105/45   Wt Readings from Last 3 Encounters:  08/04/24 272 lb 12.8 oz (123.7 kg)  04/24/24 272 lb (123.4 kg)  01/29/24 273 lb (123.8 kg)      Physical Exam Constitution:Alert  Lungs: Increased work of breathing, wheezing heard anteriorly bilaterally in all lung fields and occasional scattered wheezes posteriorly.  Patient is audibly wheezing Heart: Regular rate and rhythm  No results found for any visits on 08/04/24.    The 10-year ASCVD risk score (Arnett DK, et al., 2019) is: 1.3%    Assessment & Plan:   Problem List Items Addressed This Visit     Chronic asthma - Primary   Relevant Medications   predniSONE  (DELTASONE ) 20 MG tablet   budesonide -formoterol  (SYMBICORT ) 160-4.5 MCG/ACT inhaler   Other Relevant Orders   For home use only DME Nebulizer machine   Healthcare maintenance   Patient would like to get flu shot and COVID shot today, but did inform her we would like to see her respiratory status get a little bit better before giving her the shots.  Did inform her that she can come in without making an appointment to get flu shot.  Would also recommend getting pneumonia shot as patient never received second dose      Acute asthma  exacerbation   Patient does have recurrent history of these.  She has been off of her chronic inhalers since April of this year due to issues with insurance coverage.  Patient states that she has gotten COVID twice in the last 2 months, with her starting to have a cough on September 9.  She states in the last 2 weeks that the cough has gotten productive and she has been coughing up yellow mucus but denies any hemoptysis.  She does endorse wheezing and posttussive emesis.  She has been using Mucinex , Robitussin and TheraFlu that have not helped.  She has also been using her rescue inhaler about 6 times a day and has been using her nebulizer treatment as well daily.  No fever or chills with the last 2 weeks.  Patient has also had some congestion as well.  On physical exam patient is audibly wheezing.  As patient has history of asthma we will treat this as asthma exacerbation.  In the absence of hemodynamic instability as oxygen saturations are 100% on room air and patient is afebrile do not believe that this is a pneumonia, but can consider if patient is not getting better  Plan: Prednisone  40 mg for 5 days oral Restarted patient on Symbicort  160-4.5, 2 puffs 2 times daily Also ordered patient new nebulizer machine Also did inform patient to reach  out to ENT as she has had chemical cauterization for nosebleeds and at the time they had recommended no Flonase , saline sprays etc. but patient could benefit at this time if she is able to use      Relevant Medications   predniSONE  (DELTASONE ) 20 MG tablet   budesonide -formoterol  (SYMBICORT ) 160-4.5 MCG/ACT inhaler   Insomnia   Gave patient the number for sleep study, but did inform her that she would likely benefit more from this when she is feeling little bit better in a couple weeks.  Plan: Continue doxepin  10 mg Sleep study when she is feeling better        Return in about 3 months (around 11/04/2024) for can come in sooner if she is not feeling  better, but otherwise 3 months .  For follow-up on inhaler  Patricia Willhite D'Mello, DO Patient seen with Dr. Shawn

## 2024-08-04 NOTE — Addendum Note (Signed)
 Addended by: KEM NA on: 08/04/2024 10:10 AM   Modules accepted: Orders

## 2024-08-04 NOTE — Assessment & Plan Note (Signed)
 Gave patient the number for sleep study, but did inform her that she would likely benefit more from this when she is feeling little bit better in a couple weeks.  Plan: Continue doxepin  10 mg Sleep study when she is feeling better

## 2024-08-04 NOTE — Patient Instructions (Addendum)
 Today we discussed the following medical conditions and plan:   I believe that you are having an asthma exacerbation that could be likely due to COVID.  Although you are wheezing, I am reassured by the fact that your oxygen saturations are good without any oxygen.  We will give you prednisone  40 mg for 5 days.  I also am restarting you on a maintenance inhaler called Symbicort  that you will do 2 puffs twice daily.  Please continue using this along with your albuterol . If you start getting fevers or chills, or having worsening trouble breathing please let us  know.  Whenever you feel better you can come in to get your flu shot and COVID shot  Information for sleep study [facility] Sleep clinic in [place] Address: [address], [place], [place] [address] Phone: [phone]  We look forward to seeing you next time. Please call our clinic at [phone] if you have any questions or concerns. The best time to call is Monday-Friday from 9am-4pm, but there is someone available 24/7. If you need medication refills, please notify your pharmacy one week in advance and they will send us  a request.   Thank you for trusting me with your care. Wishing you the best!   Cyann Venti D'Mello, DO  [facility]

## 2024-08-04 NOTE — Assessment & Plan Note (Addendum)
 Patient does have recurrent history of these.  She has been off of her chronic inhalers since April of this year due to issues with insurance coverage.  Patient states that she has gotten COVID twice in the last 2 months, with her starting to have a cough on September 9.  She states in the last 2 weeks that the cough has gotten productive and she has been coughing up yellow mucus but denies any hemoptysis.  She does endorse wheezing and posttussive emesis.  She has been using Mucinex , Robitussin and TheraFlu that have not helped.  She has also been using her rescue inhaler about 6 times a day and has been using her nebulizer treatment as well daily.  No fever or chills with the last 2 weeks.  Patient has also had some congestion as well.  On physical exam patient is audibly wheezing.  As patient has history of asthma we will treat this as asthma exacerbation.  In the absence of hemodynamic instability as oxygen saturations are 100% on room air and patient is afebrile do not believe that this is a pneumonia, but can consider if patient is not getting better  Plan: Prednisone  40 mg for 5 days oral Restarted patient on Symbicort  160-4.5, 2 puffs 2 times daily Also ordered patient new nebulizer machine Also did inform patient to reach out to ENT as she has had chemical cauterization for nosebleeds and at the time they had recommended no Flonase , saline sprays etc. but patient could benefit at this time if she is able to use

## 2024-08-04 NOTE — Assessment & Plan Note (Addendum)
 Patient would like to get flu shot and COVID shot today, but did inform her we would like to see her respiratory status get a little bit better before giving her the shots.  Did inform her that she can come in without making an appointment to get flu shot.  Would also recommend getting pneumonia shot as patient never received second dose

## 2024-08-07 ENCOUNTER — Other Ambulatory Visit: Payer: Self-pay

## 2024-08-07 NOTE — Progress Notes (Signed)
 Internal Medicine Clinic Attending  I was physically present during the key portions of the resident provided service and participated in the medical decision making of patient's management care. I reviewed pertinent patient test results.  The assessment, diagnosis, and plan were formulated together and I agree with the documentation in the resident's note.  Shawn Sick, MD

## 2024-08-10 ENCOUNTER — Other Ambulatory Visit (HOSPITAL_COMMUNITY): Payer: Self-pay

## 2024-08-10 ENCOUNTER — Other Ambulatory Visit: Payer: Self-pay

## 2024-08-11 ENCOUNTER — Other Ambulatory Visit: Payer: Self-pay

## 2024-08-24 ENCOUNTER — Other Ambulatory Visit (HOSPITAL_COMMUNITY): Payer: Self-pay

## 2024-08-31 ENCOUNTER — Telehealth: Admitting: Physician Assistant

## 2024-08-31 DIAGNOSIS — J4520 Mild intermittent asthma, uncomplicated: Secondary | ICD-10-CM

## 2024-08-31 MED ORDER — BENZONATATE 100 MG PO CAPS: 100.0000 mg | ORAL_CAPSULE | Freq: Three times a day (TID) | ORAL | 0 refills | Status: AC | PRN

## 2024-08-31 MED ORDER — ALBUTEROL SULFATE (2.5 MG/3ML) 0.083% IN NEBU: 2.5000 mg | INHALATION_SOLUTION | Freq: Four times a day (QID) | RESPIRATORY_TRACT | 1 refills | Status: AC | PRN

## 2024-08-31 MED ORDER — AMOXICILLIN-POT CLAVULANATE 875-125 MG PO TABS
1.0000 | ORAL_TABLET | Freq: Two times a day (BID) | ORAL | 0 refills | Status: AC
Start: 2024-08-31 — End: ?

## 2024-08-31 NOTE — Patient Instructions (Signed)
 [name], thank you for joining Harlene PEDLAR Ward, PA-C for today's virtual visit.  While this provider is not your primary care provider (PCP), if your PCP is located in our provider database this encounter information will be shared with them immediately following your visit.   A [facility] MyChart account gives you access to today's visit and all your visits, tests, and labs performed at [facility]  click here if you don't have a [facility] MyChart account or go to [email or web address]  Consent: (Patient) [name] provided verbal consent for this virtual visit at the beginning of the encounter.  Current Medications:  Current Outpatient Medications:    albuterol  (PROVENTIL ) (2.5 MG/3ML) 0.083% nebulizer solution, Take 3 mLs (2.5 mg total) by nebulization every 6 (six) hours as needed for wheezing or shortness of breath., Disp: 150 mL, Rfl: 1   amoxicillin -clavulanate (AUGMENTIN ) 875-125 MG tablet, Take 1 tablet by mouth 2 (two) times daily., Disp: 20 tablet, Rfl: 0   benzonatate  (TESSALON ) 100 MG capsule, Take 1 capsule (100 mg total) by mouth 3 (three) times daily as needed., Disp: 20 capsule, Rfl: 0   acetaminophen  (TYLENOL ) 500 MG tablet, Take 500 mg by mouth as needed for moderate pain., Disp: , Rfl:    budesonide -formoterol  (SYMBICORT ) 160-4.5 MCG/ACT inhaler, Inhale 2 puffs into the lungs 2 (two) times daily., Disp: 10.2 g, Rfl: 12   cetirizine  (ZYRTEC ) 10 MG tablet, Take 1 tablet (10 mg total) by mouth daily., Disp: 30 tablet, Rfl: 2   Cholecalciferol  (VITAMIN D3) 10 MCG (400 UNIT) tablet, Take 2 tablets by mouth daily., Disp: 150 tablet, Rfl: 0   doxepin  (SINEQUAN ) 10 MG capsule, Take 1 capsule (10 mg total) by mouth at bedtime as needed (insomnia)., Disp: 30 capsule, Rfl: 0   doxepin  (SINEQUAN ) 10 MG/ML solution, Take 0.6 mLs (6 mg total) by mouth at bedtime., Disp: 118 mL, Rfl: 0   fluticasone  (FLONASE ) 50 MCG/ACT nasal spray, SHAKE LIQUID AND  USE 2 SPRAYS IN EACH NOSTRIL DAILY, Disp: 16 g, Rfl: 0   fluticasone -salmeterol (ADVAIR  HFA) 230-21 MCG/ACT inhaler, Inhale 1 puff into the lungs 2 (two) times daily., Disp: 12 g, Rfl: 1   pantoprazole  (PROTONIX ) 40 MG tablet, Take 1 tablet (40 mg total) by mouth 2 (two) times daily before a meal., Disp: 60 tablet, Rfl: 3   predniSONE  (DELTASONE ) 20 MG tablet, Take 2 tablets (40 mg total) by mouth daily with breakfast., Disp: 10 tablet, Rfl: 0   Vitamin D , Ergocalciferol , (DRISDOL ) 1.25 MG (50000 UNIT) CAPS capsule, Take 1 capsule (50,000 Units total) by mouth every 7 (seven) days., Disp: 6 capsule, Rfl: 0   Medications ordered in this encounter:  Meds ordered this encounter  Medications   amoxicillin -clavulanate (AUGMENTIN ) 875-125 MG tablet    Sig: Take 1 tablet by mouth 2 (two) times daily.    Dispense:  20 tablet    Refill:  0    Supervising Provider:   LAMPTEY, PHILIP O [[id number]]   benzonatate  (TESSALON ) 100 MG capsule    Sig: Take 1 capsule (100 mg total) by mouth 3 (three) times daily as needed.    Dispense:  20 capsule    Refill:  0    Supervising Provider:   BLAISE ALEENE KIDD [[id number]]   albuterol  (PROVENTIL ) (2.5 MG/3ML) 0.083% nebulizer solution    Sig: Take 3 mLs (2.5 mg total) by nebulization every 6 (six) hours as needed for wheezing or shortness of breath.    Dispense:  150 mL  Refill:  1    Supervising Provider:   BLAISE ALEENE KIDD [[id number]]     *If you need refills on other medications prior to your next appointment, please contact your pharmacy*  Follow-Up: Call back or seek an in-person evaluation if the symptoms worsen or if the condition fails to improve as anticipated.  [facility] Virtual Care [phone]  Other Instructions Take antibiotic as prescribed.  Continue with albuterol  as needed for wheezing.  I have also prescribed Tessalon  to take as needed for cough.  If no improvement or worsening symptoms please be seen in person for evaluation.    If  you have been instructed to have an in-person evaluation today at a local Urgent Care facility, please use the link below. It will take you to a list of all of our available [facility] Urgent [facility], including address, phone number and hours of operation. Please do not delay care.  [facility]  If you or a family member do not have a primary care provider, use the link below to schedule a visit and establish care. When you choose a [facility] primary care physician or advanced practice provider, you gain a long-term partner in health. Find a Primary Care Provider  Learn more about [facility]'s in-office and virtual care options: [facility] - Get Care Now

## 2024-08-31 NOTE — Progress Notes (Signed)
 Virtual Visit Consent   [name], you are scheduled for a virtual visit with a [facility] provider today. Just as with appointments in the office, your consent must be obtained to participate. Your consent will be active for this visit and any virtual visit you may have with one of our providers in the next 365 days. If you have a MyChart account, a copy of this consent can be sent to you electronically.  As this is a virtual visit, video technology does not allow for your provider to perform a traditional examination. This may limit your provider's ability to fully assess your condition. If your provider identifies any concerns that need to be evaluated in person or the need to arrange testing (such as labs, EKG, etc.), we will make arrangements to do so. Although advances in technology are sophisticated, we cannot ensure that it will always work on either your end or our end. If the connection with a video visit is poor, the visit may have to be switched to a telephone visit. With either a video or telephone visit, we are not always able to ensure that we have a secure connection.  By engaging in this virtual visit, you consent to the provision of healthcare and authorize for your insurance to be billed (if applicable) for the services provided during this visit. Depending on your insurance coverage, you may receive a charge related to this service.  I need to obtain your verbal consent now. Are you willing to proceed with your visit today? [name] has provided verbal consent on 08/31/2024 for a virtual visit (video or telephone). Harlene PEDLAR Ward, PA-C  Date: 08/31/2024 4:48 PM   Virtual Visit via Video Note   I, Harlene PEDLAR Ward, connected with  [name]  ([id number], [birth year]) on 08/31/24 at  4:45 PM EST by a video-enabled telemedicine application and verified that I am speaking with the correct person using two identifiers.  Location: Patient: Virtual Visit  Location Patient: Home Provider: Virtual Visit Location Provider: Home Office   I discussed the limitations of evaluation and management by telemedicine and the availability of in person appointments. The patient expressed understanding and agreed to proceed.    History of Present Illness: [name] is a 48 y.o. who identifies as a female who was assigned female at birth, and is being seen today for cough, congestion that started about three weeks ago.  Reports fever yesterday, subjective.  She is taking Mucinex , tylenol , robitussin.  She has a history of asthma, has been using her nebulizer with some relief. She completed a course of prednisone  about three weeks ago.    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   Leg cramping 01/29/2024   Lower extremity pain, left 07/14/2023   Chronic cough 07/14/2023   Prediabetes 02/07/2023   Thyroid  mass 12/03/2022   Acute asthma exacerbation 12/24/2021   Depression, unspecified 12/24/2021   Other fatigue 11/17/2021   Obesity (BMI 30-39.9) 10/05/2020   Gastroesophageal reflux disease 10/05/2020   Healthcare maintenance 05/09/2020   Chronic asthma 07/31/2013    Allergies: No Known Allergies Medications:  Current Outpatient Medications:    albuterol  (PROVENTIL ) (2.5 MG/3ML) 0.083% nebulizer solution, Take 3 mLs (2.5 mg total) by nebulization every 6 (six) hours as needed for wheezing or shortness of breath., Disp: 150 mL, Rfl: 1   amoxicillin -clavulanate (AUGMENTIN )  875-125 MG tablet, Take 1 tablet by mouth 2 (two) times daily., Disp: 20 tablet, Rfl: 0   benzonatate  (TESSALON ) 100 MG capsule, Take 1 capsule (100 mg total) by mouth 3 (three) times daily as needed., Disp: 20 capsule, Rfl: 0   acetaminophen  (TYLENOL ) 500 MG tablet, Take 500 mg by mouth as needed for moderate pain., Disp: , Rfl:    budesonide -formoterol   (SYMBICORT ) 160-4.5 MCG/ACT inhaler, Inhale 2 puffs into the lungs 2 (two) times daily., Disp: 10.2 g, Rfl: 12   cetirizine  (ZYRTEC ) 10 MG tablet, Take 1 tablet (10 mg total) by mouth daily., Disp: 30 tablet, Rfl: 2   Cholecalciferol  (VITAMIN D3) 10 MCG (400 UNIT) tablet, Take 2 tablets by mouth daily., Disp: 150 tablet, Rfl: 0   doxepin  (SINEQUAN ) 10 MG capsule, Take 1 capsule (10 mg total) by mouth at bedtime as needed (insomnia)., Disp: 30 capsule, Rfl: 0   doxepin  (SINEQUAN ) 10 MG/ML solution, Take 0.6 mLs (6 mg total) by mouth at bedtime., Disp: 118 mL, Rfl: 0   fluticasone  (FLONASE ) 50 MCG/ACT nasal spray, SHAKE LIQUID AND USE 2 SPRAYS IN EACH NOSTRIL DAILY, Disp: 16 g, Rfl: 0   fluticasone -salmeterol (ADVAIR  HFA) 230-21 MCG/ACT inhaler, Inhale 1 puff into the lungs 2 (two) times daily., Disp: 12 g, Rfl: 1   pantoprazole  (PROTONIX ) 40 MG tablet, Take 1 tablet (40 mg total) by mouth 2 (two) times daily before a meal., Disp: 60 tablet, Rfl: 3   predniSONE  (DELTASONE ) 20 MG tablet, Take 2 tablets (40 mg total) by mouth daily with breakfast., Disp: 10 tablet, Rfl: 0   Vitamin D , Ergocalciferol , (DRISDOL ) 1.25 MG (50000 UNIT) CAPS capsule, Take 1 capsule (50,000 Units total) by mouth every 7 (seven) days., Disp: 6 capsule, Rfl: 0  Observations/Objective: Patient is well-developed, well-nourished in no acute distress.  Resting comfortably at home.  Head is normocephalic, atraumatic.  No labored breathing.  Speech is clear and coherent with logical content.  Patient is alert and oriented at baseline.    Assessment and Plan: 1. Mild intermittent asthma, unspecified whether complicated (Primary) - For home use only DME Nebulizer machine  Given three weeks of sx will initiate antibiotics.  Tessalon  prescribed, nebulizer liquid prescribed.  Advised in person evaluation if no improvement.   Follow Up Instructions: I discussed the assessment and treatment plan with the patient. The patient was  provided an opportunity to ask questions and all were answered. The patient agreed with the plan and demonstrated an understanding of the instructions.  A copy of instructions were sent to the patient via MyChart unless otherwise noted below.     The patient was advised to call back or seek an in-person evaluation if the symptoms worsen or if the condition fails to improve as anticipated.    Harlene PEDLAR Ward, PA-C

## 2024-09-02 NOTE — Telephone Encounter (Signed)
 PT was seen at a different site   Copied from CRM #[id number]. Topic: Appointments - Scheduling Inquiry for Clinic >> Aug 28, 2024 11:38 AM Chiquita SQUIBB wrote: Reason for CRM: Patient is calling in to schedule a flu shot, the location and department are not pulling through the DT to schedule. Please assist the patient.

## 2024-09-02 NOTE — Telephone Encounter (Signed)
 Please see message below as this pt was not seen @ [facility] for the note this is being requested by the pt.  This pt was sch for an appt with a  TELEHEALTH  VISIT with another provider :  Name: Yarelis, Ambrosino MRN: [id number]  Date: 08/31/2024 Status: Comp  Time: 4:45 PM Length: 15  Visit Type: ON-DEMAND VIDEO VISIT [2052] Copay: $10.00  Provider: Ward, Harlene PEDLAR, PA-C Department: [facility]     Copied from CRM [id number]. Topic: General - Other >> Sep 01, 2024 11:43 AM Graeme ORN wrote: Reason for CRM: Patient called. Was seen yesterday via telehealth. Was supposed to receive a work note. Has not been received. Calling to check status. Left message on vm for telehealth to see if someone could reach out to her. Thank You

## 2024-09-03 ENCOUNTER — Encounter: Payer: Self-pay | Admitting: Student

## 2024-09-03 ENCOUNTER — Ambulatory Visit: Payer: Self-pay

## 2024-09-03 ENCOUNTER — Telehealth: Payer: Self-pay | Admitting: *Deleted

## 2024-09-03 NOTE — Telephone Encounter (Signed)
 Called pt - no answer. Pt saw D'Mello on 08/04/24.

## 2024-09-03 NOTE — Telephone Encounter (Signed)
 Copied from CRM [id number]. Topic: Clinical - Medical Advice >> Sep 03, 2024  1:57 PM Alfonso ORN wrote: Reason for CRM:  patient employer [employer] is requiring employees to get the flu vaccine  and patient requesting to get documentation stating that patient is  unable to get the flu vaccine due to illness  and, , patient had a office visit on 08/04/24 and was not able to get the flu vaccine do to having r sickness and still not feeling well   patient have a deadline by 09-06-2024 which has passed

## 2024-09-03 NOTE — Progress Notes (Unsigned)
 Patient name: [name] Date of birth: 12-Jul-1976 Date of visit: 09/04/24  Type of visit: Established Patient Office Visit  Subjective   Chief concern:  Chief Complaint  Patient presents with   Shortness of Breath    SOB / PRODUCTIVE COUGH-YELLOWISH IN COLOR- this has been on going since October    [name] is a 48 y.o. female with a PMHx of uncontrolled asthma, GERD, obesity, depression who presents to [facility] clinic for evaluation of shortness of breath. Patient was previously seen at [facility] 1 month ago and was treated for asthma exacerbation with oral steroids. She started feeling better for about a week. Then she started experiencing chills, decreased appetite, reduced sensation of taste, productive cough worse at night. She does have a history of GERD and takes Protonix . Does endorse a postnasal drip sensation. She was COVID negative at this time. She also had an urgent care telehealth appointment on 08/31/24. She was prescribed augmentin , tessalon  perles, and albuterol  as needed for wheezing. The patient states she started taking Mucinex  and Robitussin and these did not help improve her cough. The patient did not improve on these therapies and now states she is experiencing diarrhea after initiating the antibiotic. Last time she had a fever was on 11/2. She does endorse feeling better since her original illness but has not gotten back to her baseline level of health. She is concerned with the lack of improvement over the past month. Denies any smoking, secondhand smoke exposure, pets in her home.  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   Leg cramping 01/29/2024    Lower extremity pain, left 07/14/2023   Chronic cough 07/14/2023   Prediabetes 02/07/2023   Thyroid  mass 12/03/2022   Acute asthma exacerbation 12/24/2021   Depression, unspecified 12/24/2021   Other fatigue 11/17/2021   Obesity (BMI 30-39.9) 10/05/2020   Gastroesophageal reflux disease 10/05/2020   Healthcare maintenance 05/09/2020   Chronic asthma 07/31/2013     Past Surgical History:  Procedure Laterality Date   CHOLECYSTECTOMY  06/19/11   IR RADIOLOGY PERIPHERAL GUIDED IV START  10/23/2018   IR US  GUIDE VASC ACCESS RIGHT  10/23/2018   TUBAL LIGATION      ROS negative unless otherwise indicated in the HPI or Assessment and Plan.  Current Outpatient Medications  Medication Instructions   acetaminophen  (TYLENOL ) 500 mg, Oral, As needed   albuterol  (PROVENTIL ) 2.5 mg, Nebulization, Every 6 hours PRN   amoxicillin -clavulanate (AUGMENTIN ) 875-125 MG tablet 1 tablet, Oral, 2 times daily   benzonatate  (TESSALON ) 100 mg, Oral, 3 times daily PRN   budesonide -formoterol  (SYMBICORT ) 160-4.5 MCG/ACT inhaler 2 puffs, Inhalation, 2 times daily   cetirizine  (ZYRTEC ) 10 mg, Oral, Daily   Cholecalciferol  (VITAMIN D3) 10 MCG (400 UNIT) tablet Take 2 tablets by mouth daily.   doxepin  (SINEQUAN ) 6 mg, Oral, Daily at bedtime   doxepin  (SINEQUAN ) 10 mg, Oral, At bedtime PRN   fluticasone  (FLONASE ) 50 MCG/ACT nasal spray SHAKE LIQUID AND USE 2 SPRAYS IN EACH NOSTRIL DAILY   fluticasone -salmeterol (ADVAIR  HFA) 230-21 MCG/ACT inhaler 1 puff, Inhalation, 2 times daily   pantoprazole  (PROTONIX ) 40 mg, Oral, 2 times daily before meals   predniSONE  (DELTASONE ) 40 mg, Oral, Daily with breakfast  promethazine -dextromethorphan  (PROMETHAZINE -DM) 6.25-15 MG/5ML syrup 5 mLs, Oral, 4 times daily PRN   Vitamin D  (Ergocalciferol ) (DRISDOL ) 50,000 Units, Oral, Every 7 days    Social History   Tobacco Use   Smoking status: Never   Smokeless tobacco: Never  Vaping Use   Vaping status: Never Used   Substance Use Topics   Alcohol use: No   Drug use: No      Objective  Today's Vitals   09/04/24 0956  BP: 132/80  Pulse: 78  Temp: 97.8 F (36.6 C)  TempSrc: Oral  SpO2: 98%  Weight: 270 lb 12.8 oz (122.8 kg)  Height: 5' 11 (1.803 m)  PainSc: 0-No pain  Body mass index is 37.77 kg/m.   Physical Exam: Constitutional: well-appearing, obese; no acute distress HENT: normocephalic atraumatic, mucous membranes moist; pharyngeal erythema; bilateral TM retraction Eyes: conjunctiva non-erythematous Cardiovascular: regular rate and rhythm, no m/r/g Pulmonary/Chest: actively coughing during exam, normal work of breathing on room air, no wheezes heard on lung exam with good airflow movement.  Abdominal: soft, non-tender, non-distended MSK: normal bulk and tone Neurological: alert & oriented x 3, no focal deficit Skin: warm and dry Extremities: BLE without edema or erythema. Psych: normal mood and behavior  Last CBC Lab Results  Component Value Date   WBC 5.9 01/16/2023   HGB 11.1 (L) 01/16/2023   HCT 37.4 01/16/2023   MCV 77.9 (L) 01/16/2023   MCH 23.1 (L) 01/16/2023   RDW 14.0 01/16/2023   PLT 182 01/16/2023   Last metabolic panel Lab Results  Component Value Date   GLUCOSE 91 01/29/2024   NA 140 01/29/2024   K 4.2 01/29/2024   CL 104 01/29/2024   CO2 22 01/29/2024   BUN 15 01/29/2024   CREATININE 0.80 01/29/2024   EGFR 91 01/29/2024   CALCIUM 8.8 01/29/2024   PHOS 1.4 (L) 01/01/2012   PROT 6.9 01/16/2023   ALBUMIN 3.8 01/16/2023   BILITOT 0.5 01/16/2023   ALKPHOS 54 01/16/2023   AST 20 01/16/2023   ALT 19 01/16/2023   ANIONGAP 7 01/16/2023        Assessment & Plan  Severe persistent chronic asthma without complication (HCC) Assessment & Plan: Patient denies any smoking or secondhand exposure to smoke.  No pets in the house.  Currently taking Symbicort  160-4.5 mcg 2 puffs twice per day.  She also has albuterol  nebulizer as needed and a rescue inhaler.   She was able to describe good technique for inhaler and does also have a spacer at home.  On exam she is not wheezing with good airflow.  Do not suspect she is currently in asthma exacerbation, however she may still see benefit from pulmonology referral.  Encouraged the patient to continue taking her inhalers/nebulizers with spacer as her current illness may prevent her from deep inhalation/good technique.  Will send referral to pulmonology. - Ambulatory referral to pulmonology - Reassess in 3 months  Orders: -     Pulmonary Visit  Viral URI with cough Assessment & Plan: Patient's symptoms have not been getting better for the past 3 weeks.  She has been experiencing sore throat, earaches worse on right, productive cough, shortness of breath.  She is afebrile and not hypoxic on room air.  Exam without wheezing and there is good air movement.  Bilateral TM retraction without significant erythema.  Suspect that her symptoms are viral in nature and less likely due to bacterial infection.  Encouraged the patient to finish out course of Augmentin  as she has  already started taking this.  If she does truly have bacterial infection, Augmentin  is first-line therapy for this anyway.  Will also send prescription for promethazine -dextromethorphan  syrup to help relieve cough and allow the patient to sleep at night.  Educated the patient that her symptoms may persist for a few weeks. - Promethazine -dextromethorphan  6.25-15 mg / 5 mL syrup for 5 mL up to 4 times a day as needed  Orders: -     Promethazine -DM; Take 5 mLs by mouth 4 (four) times daily as needed for cough.  Dispense: 240 mL; Refill: 0  Encounter for immunization -     Flu vaccine trivalent PF, 6mos and older(Flulaval,Afluria,Fluarix,Fluzone)  Healthcare maintenance Assessment & Plan: Patient had previously declined flu shot at last visit and was initially requesting a work note as she is past the 08/28/2024 deadline for flu vaccinations.  She does  not have a medical contraindication for flu vaccine at this time.  Did educate the patient that she may feel slightly worse in terms of her URI symptoms with an injection site soreness.  Patient amenable to flu shot at this time. - Administered trivalent flu vaccination     Return in about 2 months (around 11/04/2024), or if symptoms worsen or fail to improve, for asthma follow up.   Patient case discussed with Dr. Rosan, who also saw and evaluated the patient.  Arnika Larzelere, MD [facility]  PGY-1 09/04/2024, 11:03 AM

## 2024-09-03 NOTE — Telephone Encounter (Addendum)
 FYI Only or Action Required?: FYI only for provider: appointment scheduled on 11/7.  Patient was last seen in primary care on 08/31/2024 by Ward, Harlene PEDLAR, PA-C.  Called Nurse Triage reporting Shortness of Breath and Cough.  Symptoms began about a month ago.  Interventions attempted: Prescription medications: Nebulizer, inhaler.  Symptoms are: unchanged.  Triage Disposition: See HCP Within 4 Hours (Or PCP Triage)  Patient/caregiver understands and will follow disposition?: Yes        Copied from CRM #[id number]. Topic: Clinical - Red Word Triage >> Sep 03, 2024  1:58 PM Alfonso ORN wrote: Red Word that prompted transfer to Nurse Triage:  patient had a office visit on 08/04/24 and still not feeling well  , she had a video visit on Monday 08/31/24 , patient still not feeling well   still have stuffy nose the amoxicillin  is causes severe diarrhea and have shortness of breathe Reason for Disposition  [1] MILD difficulty breathing (e.g., minimal/no SOB at rest, SOB with walking, pulse < 100) AND [2] NEW-onset or WORSE than normal  Answer Assessment - Initial Assessment Questions 1. RESPIRATORY STATUS: Describe your breathing? (e.g., wheezing, shortness of breath, unable to speak, severe coughing)       SOB from asthma, nebulizer and inhaler only providing temporary relief. Wheezing noted  2. ONSET: When did this breathing problem begin?      X 1 month intermittently   3. PATTERN Does the difficult breathing come and go, or has it been constant since it started?       Intermittently, with exertion   4. SEVERITY: How bad is your breathing? (e.g., mild, moderate, severe)      Mild   5. RECURRENT SYMPTOM: Have you had difficulty breathing before? If Yes, ask: When was the last time? and What happened that time?      Yes, ongoing   6. CARDIAC HISTORY: Do you have any history of heart disease? (e.g., heart attack, angina, bypass surgery, angioplasty)      No   7. LUNG  HISTORY: Do you have any history of lung disease?  (e.g., pulmonary embolus, asthma, emphysema)     asthma  8. CAUSE: What do you think is causing the breathing problem?      Asthma, upper respiratory infection    9. OTHER SYMPTOMS: Do you have any other symptoms? (e.g., chest pain, cough, dizziness, fever, runny nose)  Cough noted, nasal congestion- she has been doing salt water rinses for the congestion.    Patient had a video visit on Monday 08/31/24 for Mild intermittent asthma, symptoms persist. The amoxicillin  she was prescribed is causing diarrhea. Appt. Scheduled for 11/7 as she was offered an appt. For today 11/6 but she has pre existing appts. Scheduled for today. If SOB worsens, she will seek care in the ED/UC but plans on attending the 11/7 appt.  Protocols used: Breathing Difficulty-A-AH

## 2024-09-04 ENCOUNTER — Encounter: Payer: Self-pay | Admitting: Pharmacist

## 2024-09-04 ENCOUNTER — Other Ambulatory Visit (HOSPITAL_BASED_OUTPATIENT_CLINIC_OR_DEPARTMENT_OTHER): Payer: Self-pay

## 2024-09-04 ENCOUNTER — Other Ambulatory Visit: Payer: Self-pay

## 2024-09-04 ENCOUNTER — Ambulatory Visit (INDEPENDENT_AMBULATORY_CARE_PROVIDER_SITE_OTHER): Payer: Self-pay

## 2024-09-04 VITALS — BP 132/80 | HR 78 | Temp 97.8°F | Ht 71.0 in | Wt 270.8 lb

## 2024-09-04 DIAGNOSIS — Z23 Encounter for immunization: Secondary | ICD-10-CM

## 2024-09-04 DIAGNOSIS — J069 Acute upper respiratory infection, unspecified: Secondary | ICD-10-CM | POA: Diagnosis not present

## 2024-09-04 DIAGNOSIS — B349 Viral infection, unspecified: Secondary | ICD-10-CM | POA: Diagnosis not present

## 2024-09-04 DIAGNOSIS — Z0000 Encounter for general adult medical examination without abnormal findings: Secondary | ICD-10-CM

## 2024-09-04 DIAGNOSIS — J4550 Severe persistent asthma, uncomplicated: Secondary | ICD-10-CM | POA: Diagnosis not present

## 2024-09-04 MED ORDER — PROMETHAZINE-DM 6.25-15 MG/5ML PO SYRP
5.0000 mL | ORAL_SOLUTION | Freq: Four times a day (QID) | ORAL | 0 refills | Status: AC | PRN
  Filled 2024-09-04 – 2024-09-23 (×3): qty 240, 12d supply, fill #0

## 2024-09-04 NOTE — Patient Instructions (Addendum)
 Thank you, [name] for allowing us  to provide your care today. Today we discussed the following:  - Your current symptoms are concerning for a viral upper respiratory infection.  It does not sound like you necessarily have a bacterial infection given that you are airways are clear and you have good airflow.  However, you can continue taking the Augmentin  until you run out. -We have sent a prescription for promethazine -dextromethorphan  syrup that you can try up to 4 times a day.  This should help relieve your coughing and allow you to sleep at night. - Please call our office if you experience any high fevers, worsening shortness of breath/wheezing.  You may need to seek a higher level care if you experience the symptoms. -For your asthma, please continue taking your Symbicort  inhaler 2 puffs/day once in the morning and once at night.  You can also try using your spacer to ensure you are receiving the medication with each breath.  We have sent a referral to the lung specialists (pulmonology). - We have also administered your flu shot at this time.  It is safe to do so, but please note you may feel a little bit worse which is expected.  You may also feel some soreness at the injection site.  I have ordered the following labs for you:  Lab Orders  No laboratory test(s) ordered today     Tests ordered today:   Referrals ordered today:   Referral Orders         Ambulatory referral to Pulmonology      I have ordered the following medication/changed the following medications:   Stop the following medications: There are no discontinued medications.   Start the following medications: Meds ordered this encounter  Medications   promethazine -dextromethorphan  (PROMETHAZINE -DM) 6.25-15 MG/5ML syrup    Sig: Take 5 mLs by mouth 4 (four) times daily as needed for cough.    Dispense:  240 mL    Refill:  0    Flu vaccine administered  Follow up: 2 months    Remember:   Should you  have any questions or concerns please call the Internal Medicine Clinic at [phone].     Telissa Palmisano, MD [facility]

## 2024-09-04 NOTE — Assessment & Plan Note (Signed)
 Patient denies any smoking or secondhand exposure to smoke.  No pets in the house.  Currently taking Symbicort  160-4.5 mcg 2 puffs twice per day.  She also has albuterol  nebulizer as needed and a rescue inhaler.  She was able to describe good technique for inhaler and does also have a spacer at home.  On exam she is not wheezing with good airflow.  Do not suspect she is currently in asthma exacerbation, however she may still see benefit from pulmonology referral.  Encouraged the patient to continue taking her inhalers/nebulizers with spacer as her current illness may prevent her from deep inhalation/good technique.  Will send referral to pulmonology. - Ambulatory referral to pulmonology - Reassess in 3 months

## 2024-09-04 NOTE — Telephone Encounter (Signed)
 Pt called and informed of Dr Zheng's response. Pt stated she was still sick and was unable to come back in on the 31st for the flu shot. Pt currently sounds like she does not feel well, coughing while talking on the phone.

## 2024-09-04 NOTE — Assessment & Plan Note (Addendum)
 Patient's symptoms have not been getting better for the past 3 weeks.  She has been experiencing sore throat, earaches worse on right, productive cough, shortness of breath.  She is afebrile and not hypoxic on room air.  Exam without wheezing and there is good air movement.  Bilateral TM retraction without significant erythema.  Suspect that her symptoms are viral in nature and less likely due to bacterial infection.  Encouraged the patient to finish out course of Augmentin  as she has already started taking this.  If she does truly have bacterial infection, Augmentin  is first-line therapy for this anyway.  Will also send prescription for promethazine -dextromethorphan  syrup to help relieve cough and allow the patient to sleep at night.  Educated the patient that her symptoms may persist for a few weeks. - Promethazine -dextromethorphan  6.25-15 mg / 5 mL syrup for 5 mL up to 4 times a day as needed

## 2024-09-04 NOTE — Assessment & Plan Note (Signed)
 Patient had previously declined flu shot at last visit and was initially requesting a work note as she is past the 08/28/2024 deadline for flu vaccinations.  She does not have a medical contraindication for flu vaccine at this time.  Did educate the patient that she may feel slightly worse in terms of her URI symptoms with an injection site soreness.  Patient amenable to flu shot at this time. - Administered trivalent flu vaccination

## 2024-09-04 NOTE — Telephone Encounter (Signed)
 Pt saw Dr Waymond this am.

## 2024-09-06 ENCOUNTER — Other Ambulatory Visit (HOSPITAL_COMMUNITY): Payer: Self-pay

## 2024-09-09 ENCOUNTER — Other Ambulatory Visit: Payer: Self-pay

## 2024-09-10 ENCOUNTER — Ambulatory Visit: Admitting: Internal Medicine

## 2024-09-10 ENCOUNTER — Encounter: Payer: Self-pay | Admitting: Internal Medicine

## 2024-09-14 ENCOUNTER — Other Ambulatory Visit (HOSPITAL_COMMUNITY): Payer: Self-pay

## 2024-09-14 NOTE — Progress Notes (Signed)
 Internal Medicine Clinic Attending  I was physically present during the key portions of the resident provided service and participated in the medical decision making of patient's management care. I reviewed pertinent patient test results.  The assessment, diagnosis, and plan were formulated together and I agree with the documentation in the resident's note.  Rosan Dayton BROCKS, DO

## 2024-09-22 ENCOUNTER — Other Ambulatory Visit: Payer: Self-pay | Admitting: Student

## 2024-09-22 ENCOUNTER — Other Ambulatory Visit (HOSPITAL_COMMUNITY): Payer: Self-pay

## 2024-09-22 ENCOUNTER — Other Ambulatory Visit: Payer: Self-pay

## 2024-09-22 MED ORDER — FLUCONAZOLE 150 MG PO TABS
150.0000 mg | ORAL_TABLET | Freq: Every day | ORAL | 0 refills | Status: DC
  Filled 2024-09-22 – 2024-09-23 (×2): qty 2, 2d supply, fill #0

## 2024-09-23 ENCOUNTER — Other Ambulatory Visit: Payer: Self-pay

## 2024-09-23 ENCOUNTER — Other Ambulatory Visit (HOSPITAL_COMMUNITY): Payer: Self-pay

## 2024-09-25 ENCOUNTER — Other Ambulatory Visit (HOSPITAL_COMMUNITY): Payer: Self-pay

## 2024-10-14 ENCOUNTER — Encounter: Payer: Self-pay | Admitting: Internal Medicine

## 2024-10-14 ENCOUNTER — Other Ambulatory Visit (HOSPITAL_COMMUNITY): Payer: Self-pay

## 2024-10-14 ENCOUNTER — Ambulatory Visit: Admitting: Internal Medicine

## 2024-10-14 VITALS — BP 126/74 | HR 70 | Ht 71.0 in | Wt 269.0 lb

## 2024-10-14 DIAGNOSIS — J301 Allergic rhinitis due to pollen: Secondary | ICD-10-CM

## 2024-10-14 DIAGNOSIS — R053 Chronic cough: Secondary | ICD-10-CM | POA: Diagnosis not present

## 2024-10-14 DIAGNOSIS — J4540 Moderate persistent asthma, uncomplicated: Secondary | ICD-10-CM | POA: Diagnosis not present

## 2024-10-14 DIAGNOSIS — K219 Gastro-esophageal reflux disease without esophagitis: Secondary | ICD-10-CM

## 2024-10-14 LAB — NITRIC OXIDE: Nitric Oxide: 29

## 2024-10-14 MED ORDER — BUDESONIDE-FORMOTEROL FUMARATE 160-4.5 MCG/ACT IN AERO
2.0000 | INHALATION_SPRAY | Freq: Two times a day (BID) | RESPIRATORY_TRACT | 12 refills | Status: AC
  Filled 2024-10-14 – 2024-11-06 (×2): qty 10.2, 30d supply, fill #0

## 2024-10-14 MED ORDER — MONTELUKAST SODIUM 10 MG PO TABS
10.0000 mg | ORAL_TABLET | Freq: Every day | ORAL | 11 refills | Status: AC
  Filled 2024-10-14: qty 30, 30d supply, fill #0
  Filled 2024-11-06: qty 90, 90d supply, fill #0

## 2024-10-14 NOTE — Patient Instructions (Addendum)
 It was a pleasure to see you today!  Please schedule follow up with Dr. Pleas in 2 months. Please call sooner [phone] if issues or concerns arise. You can also send us  a message through MyChart, but but aware that this is not to be used for urgent issues and it may take up to 5-7 days to receive a reply. Please be aware that you will likely be able to view your results before I have a chance to respond to them. Please give us  5 business days to respond to any non-urgent results.    Sounds like the symbicort  is improving your breathing. Continue 2 puffs twice a day gargle after use.  Use albuterol  only if you need it for symptoms of chest pain, tightness, shortness of breath 2 puffs up to four times a day.   Will add montelukast  for asthma and allergies to your list.   Continue the reflux medication to help with nighttime cough.

## 2024-10-14 NOTE — Progress Notes (Signed)
 [name]    [id number]    04-14-1976  Primary Care Physician:Ingold, Norman, DO Date of Appointment: 10/14/2024 Established Patient Visit  Chief complaint:   Chief Complaint  Patient presents with   Medical Management of Chronic Issues   Asthma    She had some increased cough and wheezing in Oct 2025, had Covid 19.  Her breathing has improved some but not quite back at her baseline. She has SOB and cough with exertion such as walking up stairs. Her cough is prod with clear sputum.      HPI: [name]  is a 48 y.o. woman with moderate persistent asthma.  Interval Updates: Here for follow up after over 2 years.   Had covid earlier this year. Feeling better from this. But her pcp wanted her to come back to see us . She has ben in the hospital for respiratory distress.   She was given prednisone  in October after covid infection - did not feel better with predisone.   Has good days and bad days.   She continues to have shortness of breath and coughing, usually worse at night. She is still taking acid reflux medication twice daily.   Having issues with nosebleeds and has seen ENT.    Current asthma therapy: Since October has been on Symbicort  2 puffs bid and prn albuterol . She feels that the symbicort  is helping her breathing her breathing as well as the albuteorl usually takes albuterol  4 times/day.   Has been on singulair  before - thinks she did ok on that.   I have reviewed the patient's family social and past medical history and updated as appropriate.   Past Medical History:  Diagnosis Date   Abdominal pain    Acute respiratory failure with hypoxia (HCC) 01/14/2023   Anemia    Asthma    Depression    Diarrhea    Family history of DVT 03/21/2020   Flexor tenosynovitis of finger 12/03/2022   GERD without esophagitis 12/24/2021   Heart murmur    Hypertension    Hypokalemia 01/01/2012   Influenza A 01/15/2023   Irritable larynx  syndrome 01/15/2023   Obesity    Plantar fasciitis    Plantar fasciitis 12/28/2019   Shortness of breath    Trichomonal vaginitis 07/23/2022   Uterine leiomyoma 10/05/2020   Vomiting     Past Surgical History:  Procedure Laterality Date   CHOLECYSTECTOMY  06/19/11   IR RADIOLOGY PERIPHERAL GUIDED IV START  10/23/2018   IR US  GUIDE VASC ACCESS RIGHT  10/23/2018   TUBAL LIGATION      Family History  Problem Relation Age of Onset   Diabetes Mother 18   Hypertension Mother    Other Mother        sepsis   Emphysema Father 87       smoked   Heart disease Father 35       MI   Emphysema Maternal Aunt        smoked   Esophageal cancer Maternal Aunt    Lung cancer Maternal Aunt        smoked   Heart disease Maternal Grandfather    Heart disease Maternal Aunt    Heart disease Sister 68       MI   Cancer Brother    Cancer Maternal Uncle    Cancer Maternal Grandmother    Heart disease Sister    Aneurysm Brother    Colon cancer  Neg Hx    Colon polyps Neg Hx    Rectal cancer Neg Hx    Stomach cancer Neg Hx     Social History   Occupational History   Not on file  Tobacco Use   Smoking status: Never   Smokeless tobacco: Never  Vaping Use   Vaping status: Never Used  Substance and Sexual Activity   Alcohol use: No   Drug use: No   Sexual activity: Not Currently    Birth control/protection: None     Physical Exam: Blood pressure 126/74, pulse 70, height 5' 11 (1.803 m), weight 269 lb (122 kg), SpO2 98%.  Gen:     No distress, no cough ENT:  +cobblestoning, mallampati IV,  Lungs:    ctab no wheezes or crackles CV:       RRR no mrg   Data Reviewed: Imaging: Chest xray October 2024 personally reviewed - no effusion, pneumonia, no acute cardiopulmonary process.   PFTs:      No data to display          Labs: Lab Results  Component Value Date   NA 140 01/29/2024   K 4.2 01/29/2024   CO2 22 01/29/2024   GLUCOSE 91 01/29/2024   BUN 15 01/29/2024    CREATININE 0.80 01/29/2024   CALCIUM 8.8 01/29/2024   EGFR 91 01/29/2024   GFRNONAA >60 01/16/2023   Lab Results  Component Value Date   WBC 5.9 01/16/2023   HGB 11.1 (L) 01/16/2023   HCT 37.4 01/16/2023   MCV 77.9 (L) 01/16/2023   PLT 182 01/16/2023    Immunization status: Immunization History  Administered Date(s) Administered   Influenza Split 07/29/2012, 07/30/2013   Influenza, Seasonal, Injecte, Preservative Fre 07/12/2023, 09/04/2024   Influenza,inj,Quad PF,6+ Mos 10/05/2020, 08/22/2022   Influenza,inj,Quad PF,6-35 Mos 07/29/2019   Influenza,inj,quad, With Preservative 07/29/2012, 07/30/2013, 08/26/2017, 09/03/2018   Influenza-Unspecified 09/03/2018, 09/28/2021   PFIZER(Purple Top)SARS-COV-2 Vaccination 01/22/2020, 02/16/2020, 07/05/2020   Pneumococcal Polysaccharide-23 08/25/2018    External Records Personally Reviewed:   Assessment:  Moderate persistent asthma Chronic cough, probable rhinitis GERD  Plan/Recommendations: Sounds like the symbicort  is improving your breathing. Continue 2 puffs twice a day gargle after use.  Use albuterol  only if you need it for symptoms of chest pain, tightness, shortness of breath 2 puffs up to four times a day.   Will add montelukast  for asthma and allergies to your list.   Continue the reflux medication to help with nighttime cough.   Lab Results  Component Value Date   NITRICOXIDE 29 10/14/2024   This result suggests intermediate (25-49) Type 2 (T2) airway inflammation; clinical correlation required.   Return to Care: Return in about 3 months (around 01/12/2025) for Dr. Pleas .   Verdon Gore, MD Pulmonary and Critical Care Medicine [facility] Office:[phone]

## 2024-10-15 ENCOUNTER — Other Ambulatory Visit (HOSPITAL_COMMUNITY): Payer: Self-pay

## 2024-10-15 ENCOUNTER — Other Ambulatory Visit: Payer: Self-pay

## 2024-10-16 ENCOUNTER — Other Ambulatory Visit (HOSPITAL_COMMUNITY): Payer: Self-pay

## 2024-10-21 ENCOUNTER — Encounter (HOSPITAL_COMMUNITY): Payer: Self-pay

## 2024-10-21 ENCOUNTER — Other Ambulatory Visit (HOSPITAL_COMMUNITY): Payer: Self-pay

## 2024-10-27 ENCOUNTER — Other Ambulatory Visit: Payer: Self-pay

## 2024-11-06 ENCOUNTER — Other Ambulatory Visit: Payer: Self-pay

## 2024-11-06 ENCOUNTER — Other Ambulatory Visit (HOSPITAL_COMMUNITY): Payer: Self-pay

## 2024-11-09 ENCOUNTER — Other Ambulatory Visit: Payer: Self-pay

## 2024-11-09 ENCOUNTER — Encounter: Payer: Self-pay | Admitting: Pharmacist

## 2024-11-12 ENCOUNTER — Other Ambulatory Visit: Payer: Self-pay

## 2025-01-12 ENCOUNTER — Encounter
